# Patient Record
Sex: Female | Born: 1950 | Race: Black or African American | Hispanic: No | Marital: Single | Smoking: Current every day smoker
Health system: Southern US, Community
[De-identification: ages and names within clinical notes are randomized; demographics above are authoritative.]

## PROBLEM LIST (undated history)

## (undated) DIAGNOSIS — Z7289 Other problems related to lifestyle: Secondary | ICD-10-CM

## (undated) DIAGNOSIS — F1090 Alcohol use, unspecified, uncomplicated: Secondary | ICD-10-CM

## (undated) DIAGNOSIS — J449 Chronic obstructive pulmonary disease, unspecified: Secondary | ICD-10-CM

## (undated) DIAGNOSIS — Z720 Tobacco use: Secondary | ICD-10-CM

## (undated) DIAGNOSIS — R7303 Prediabetes: Secondary | ICD-10-CM

## (undated) HISTORY — PX: TOTAL ABDOMINAL HYSTERECTOMY: SHX209

---

## 2019-07-23 DIAGNOSIS — Z1329 Encounter for screening for other suspected endocrine disorder: Secondary | ICD-10-CM | POA: Diagnosis not present

## 2019-07-23 DIAGNOSIS — Z1321 Encounter for screening for nutritional disorder: Secondary | ICD-10-CM | POA: Diagnosis not present

## 2019-07-23 DIAGNOSIS — R0602 Shortness of breath: Secondary | ICD-10-CM | POA: Diagnosis not present

## 2019-07-23 DIAGNOSIS — R0789 Other chest pain: Secondary | ICD-10-CM | POA: Diagnosis not present

## 2019-07-23 DIAGNOSIS — Z1389 Encounter for screening for other disorder: Secondary | ICD-10-CM | POA: Diagnosis not present

## 2019-07-23 DIAGNOSIS — Z131 Encounter for screening for diabetes mellitus: Secondary | ICD-10-CM | POA: Diagnosis not present

## 2019-07-23 DIAGNOSIS — Z0001 Encounter for general adult medical examination with abnormal findings: Secondary | ICD-10-CM | POA: Diagnosis not present

## 2019-07-23 DIAGNOSIS — Z136 Encounter for screening for cardiovascular disorders: Secondary | ICD-10-CM | POA: Diagnosis not present

## 2019-07-23 DIAGNOSIS — Z720 Tobacco use: Secondary | ICD-10-CM | POA: Diagnosis not present

## 2019-07-24 DIAGNOSIS — R0602 Shortness of breath: Secondary | ICD-10-CM | POA: Diagnosis not present

## 2019-07-24 DIAGNOSIS — I5021 Acute systolic (congestive) heart failure: Secondary | ICD-10-CM | POA: Diagnosis not present

## 2019-08-11 DIAGNOSIS — I5022 Chronic systolic (congestive) heart failure: Secondary | ICD-10-CM | POA: Diagnosis not present

## 2019-08-11 DIAGNOSIS — Z131 Encounter for screening for diabetes mellitus: Secondary | ICD-10-CM | POA: Diagnosis not present

## 2019-08-11 DIAGNOSIS — Z0001 Encounter for general adult medical examination with abnormal findings: Secondary | ICD-10-CM | POA: Diagnosis not present

## 2019-08-11 DIAGNOSIS — E559 Vitamin D deficiency, unspecified: Secondary | ICD-10-CM | POA: Diagnosis not present

## 2019-08-11 DIAGNOSIS — E7800 Pure hypercholesterolemia, unspecified: Secondary | ICD-10-CM | POA: Diagnosis not present

## 2019-08-11 DIAGNOSIS — R739 Hyperglycemia, unspecified: Secondary | ICD-10-CM | POA: Diagnosis not present

## 2019-08-12 ENCOUNTER — Other Ambulatory Visit: Payer: Self-pay | Admitting: Internal Medicine

## 2019-08-12 ENCOUNTER — Other Ambulatory Visit: Payer: Self-pay | Admitting: Physician Assistant

## 2019-08-12 DIAGNOSIS — R5381 Other malaise: Secondary | ICD-10-CM

## 2019-08-12 DIAGNOSIS — Z1231 Encounter for screening mammogram for malignant neoplasm of breast: Secondary | ICD-10-CM

## 2019-08-31 ENCOUNTER — Ambulatory Visit: Payer: Medicare HMO

## 2019-09-02 ENCOUNTER — Ambulatory Visit: Payer: Self-pay | Admitting: Cardiology

## 2019-09-25 ENCOUNTER — Ambulatory Visit: Payer: Medicare HMO

## 2019-09-25 ENCOUNTER — Other Ambulatory Visit: Payer: Self-pay | Admitting: Internal Medicine

## 2019-09-25 DIAGNOSIS — Z13820 Encounter for screening for osteoporosis: Secondary | ICD-10-CM

## 2019-11-10 DIAGNOSIS — R0789 Other chest pain: Secondary | ICD-10-CM | POA: Diagnosis not present

## 2019-11-10 DIAGNOSIS — J410 Simple chronic bronchitis: Secondary | ICD-10-CM | POA: Diagnosis not present

## 2019-11-10 DIAGNOSIS — R739 Hyperglycemia, unspecified: Secondary | ICD-10-CM | POA: Diagnosis not present

## 2019-11-10 DIAGNOSIS — E119 Type 2 diabetes mellitus without complications: Secondary | ICD-10-CM | POA: Diagnosis not present

## 2019-11-10 DIAGNOSIS — E7800 Pure hypercholesterolemia, unspecified: Secondary | ICD-10-CM | POA: Diagnosis not present

## 2019-11-10 DIAGNOSIS — E559 Vitamin D deficiency, unspecified: Secondary | ICD-10-CM | POA: Diagnosis not present

## 2019-11-10 DIAGNOSIS — I5022 Chronic systolic (congestive) heart failure: Secondary | ICD-10-CM | POA: Diagnosis not present

## 2019-11-10 DIAGNOSIS — Z23 Encounter for immunization: Secondary | ICD-10-CM | POA: Diagnosis not present

## 2019-12-06 ENCOUNTER — Inpatient Hospital Stay (HOSPITAL_COMMUNITY): Payer: Medicare HMO

## 2019-12-06 ENCOUNTER — Encounter (HOSPITAL_COMMUNITY): Payer: Self-pay | Admitting: Internal Medicine

## 2019-12-06 ENCOUNTER — Inpatient Hospital Stay (HOSPITAL_COMMUNITY)
Admission: EM | Admit: 2019-12-06 | Discharge: 2019-12-10 | DRG: 286 | Disposition: A | Discharge status: Home / Self Care | Payer: Medicare HMO | Attending: Internal Medicine | Admitting: Internal Medicine

## 2019-12-06 ENCOUNTER — Emergency Department (HOSPITAL_COMMUNITY): Payer: Medicare HMO

## 2019-12-06 DIAGNOSIS — Z20822 Contact with and (suspected) exposure to covid-19: Secondary | ICD-10-CM | POA: Diagnosis present

## 2019-12-06 DIAGNOSIS — F17210 Nicotine dependence, cigarettes, uncomplicated: Secondary | ICD-10-CM | POA: Diagnosis present

## 2019-12-06 DIAGNOSIS — J9601 Acute respiratory failure with hypoxia: Secondary | ICD-10-CM

## 2019-12-06 DIAGNOSIS — I517 Cardiomegaly: Secondary | ICD-10-CM | POA: Diagnosis not present

## 2019-12-06 DIAGNOSIS — R0602 Shortness of breath: Secondary | ICD-10-CM | POA: Diagnosis not present

## 2019-12-06 DIAGNOSIS — I472 Ventricular tachycardia: Secondary | ICD-10-CM | POA: Diagnosis not present

## 2019-12-06 DIAGNOSIS — I491 Atrial premature depolarization: Secondary | ICD-10-CM | POA: Diagnosis not present

## 2019-12-06 DIAGNOSIS — R7303 Prediabetes: Secondary | ICD-10-CM | POA: Diagnosis not present

## 2019-12-06 DIAGNOSIS — R0600 Dyspnea, unspecified: Secondary | ICD-10-CM | POA: Diagnosis not present

## 2019-12-06 DIAGNOSIS — R778 Other specified abnormalities of plasma proteins: Secondary | ICD-10-CM | POA: Diagnosis not present

## 2019-12-06 DIAGNOSIS — I272 Pulmonary hypertension, unspecified: Secondary | ICD-10-CM | POA: Diagnosis present

## 2019-12-06 DIAGNOSIS — I5023 Acute on chronic systolic (congestive) heart failure: Secondary | ICD-10-CM | POA: Insufficient documentation

## 2019-12-06 DIAGNOSIS — F1090 Alcohol use, unspecified, uncomplicated: Secondary | ICD-10-CM

## 2019-12-06 DIAGNOSIS — Z599 Problem related to housing and economic circumstances, unspecified: Secondary | ICD-10-CM

## 2019-12-06 DIAGNOSIS — I509 Heart failure, unspecified: Secondary | ICD-10-CM

## 2019-12-06 DIAGNOSIS — J449 Chronic obstructive pulmonary disease, unspecified: Secondary | ICD-10-CM | POA: Diagnosis present

## 2019-12-06 DIAGNOSIS — I5043 Acute on chronic combined systolic (congestive) and diastolic (congestive) heart failure: Secondary | ICD-10-CM | POA: Diagnosis not present

## 2019-12-06 DIAGNOSIS — I493 Ventricular premature depolarization: Secondary | ICD-10-CM | POA: Diagnosis not present

## 2019-12-06 DIAGNOSIS — R9431 Abnormal electrocardiogram [ECG] [EKG]: Secondary | ICD-10-CM | POA: Diagnosis present

## 2019-12-06 DIAGNOSIS — I361 Nonrheumatic tricuspid (valve) insufficiency: Secondary | ICD-10-CM | POA: Diagnosis not present

## 2019-12-06 DIAGNOSIS — R0601 Orthopnea: Secondary | ICD-10-CM | POA: Diagnosis not present

## 2019-12-06 DIAGNOSIS — R079 Chest pain, unspecified: Secondary | ICD-10-CM

## 2019-12-06 DIAGNOSIS — I5020 Unspecified systolic (congestive) heart failure: Secondary | ICD-10-CM | POA: Diagnosis not present

## 2019-12-06 DIAGNOSIS — R0902 Hypoxemia: Secondary | ICD-10-CM | POA: Diagnosis not present

## 2019-12-06 DIAGNOSIS — Z720 Tobacco use: Secondary | ICD-10-CM | POA: Diagnosis present

## 2019-12-06 DIAGNOSIS — Z66 Do not resuscitate: Secondary | ICD-10-CM | POA: Diagnosis present

## 2019-12-06 DIAGNOSIS — I110 Hypertensive heart disease with heart failure: Principal | ICD-10-CM | POA: Diagnosis present

## 2019-12-06 DIAGNOSIS — Z91040 Latex allergy status: Secondary | ICD-10-CM | POA: Diagnosis not present

## 2019-12-06 DIAGNOSIS — I420 Dilated cardiomyopathy: Secondary | ICD-10-CM | POA: Diagnosis present

## 2019-12-06 DIAGNOSIS — J811 Chronic pulmonary edema: Secondary | ICD-10-CM | POA: Diagnosis not present

## 2019-12-06 DIAGNOSIS — Z7289 Other problems related to lifestyle: Secondary | ICD-10-CM

## 2019-12-06 DIAGNOSIS — E785 Hyperlipidemia, unspecified: Secondary | ICD-10-CM | POA: Diagnosis present

## 2019-12-06 DIAGNOSIS — Z6825 Body mass index (BMI) 25.0-25.9, adult: Secondary | ICD-10-CM | POA: Diagnosis not present

## 2019-12-06 DIAGNOSIS — R634 Abnormal weight loss: Secondary | ICD-10-CM | POA: Diagnosis present

## 2019-12-06 DIAGNOSIS — Z823 Family history of stroke: Secondary | ICD-10-CM

## 2019-12-06 DIAGNOSIS — I081 Rheumatic disorders of both mitral and tricuspid valves: Secondary | ICD-10-CM | POA: Diagnosis not present

## 2019-12-06 DIAGNOSIS — I428 Other cardiomyopathies: Secondary | ICD-10-CM | POA: Diagnosis present

## 2019-12-06 DIAGNOSIS — Z789 Other specified health status: Secondary | ICD-10-CM

## 2019-12-06 DIAGNOSIS — I209 Angina pectoris, unspecified: Secondary | ICD-10-CM | POA: Diagnosis not present

## 2019-12-06 DIAGNOSIS — I340 Nonrheumatic mitral (valve) insufficiency: Secondary | ICD-10-CM | POA: Diagnosis not present

## 2019-12-06 HISTORY — DX: Other problems related to lifestyle: Z72.89

## 2019-12-06 HISTORY — DX: Tobacco use: Z72.0

## 2019-12-06 HISTORY — DX: Alcohol use, unspecified, uncomplicated: F10.90

## 2019-12-06 HISTORY — DX: Chronic obstructive pulmonary disease, unspecified: J44.9

## 2019-12-06 HISTORY — DX: Prediabetes: R73.03

## 2019-12-06 LAB — URINALYSIS, ROUTINE W REFLEX MICROSCOPIC
Bacteria, UA: NONE SEEN
Bilirubin Urine: NEGATIVE
Glucose, UA: NEGATIVE mg/dL
Hgb urine dipstick: NEGATIVE
Ketones, ur: NEGATIVE mg/dL
Nitrite: NEGATIVE
Protein, ur: 30 mg/dL — AB
Specific Gravity, Urine: 1.006 (ref 1.005–1.030)
pH: 5 (ref 5.0–8.0)

## 2019-12-06 LAB — COMPREHENSIVE METABOLIC PANEL
ALT: 16 U/L (ref 0–44)
AST: 23 U/L (ref 15–41)
Albumin: 3.2 g/dL — ABNORMAL LOW (ref 3.5–5.0)
Alkaline Phosphatase: 40 U/L (ref 38–126)
Anion gap: 13 (ref 5–15)
BUN: 15 mg/dL (ref 8–23)
CO2: 18 mmol/L — ABNORMAL LOW (ref 22–32)
Calcium: 8.9 mg/dL (ref 8.9–10.3)
Chloride: 106 mmol/L (ref 98–111)
Creatinine, Ser: 0.81 mg/dL (ref 0.44–1.00)
GFR, Estimated: >60 mL/min (ref >60)
Glucose, Bld: 149 mg/dL — ABNORMAL HIGH (ref 70–99)
Potassium: 3.7 mmol/L (ref 3.5–5.1)
Sodium: 137 mmol/L (ref 135–145)
Total Bilirubin: 1.2 mg/dL (ref 0.3–1.2)
Total Protein: 6.5 g/dL (ref 6.5–8.1)

## 2019-12-06 LAB — CBC WITH DIFFERENTIAL/PLATELET
Abs Immature Granulocytes: 0.03 10*3/uL (ref 0.00–0.07)
Basophils Absolute: 0.1 10*3/uL (ref 0.0–0.1)
Basophils Relative: 1 %
Eosinophils Absolute: 0.3 10*3/uL (ref 0.0–0.5)
Eosinophils Relative: 4 %
HCT: 40.5 % (ref 36.0–46.0)
Hemoglobin: 12.5 g/dL (ref 12.0–15.0)
Immature Granulocytes: 0 %
Lymphocytes Relative: 34 %
Lymphs Abs: 2.4 10*3/uL (ref 0.7–4.0)
MCH: 27.6 pg (ref 26.0–34.0)
MCHC: 30.9 g/dL (ref 30.0–36.0)
MCV: 89.4 fL (ref 80.0–100.0)
Monocytes Absolute: 0.7 10*3/uL (ref 0.1–1.0)
Monocytes Relative: 10 %
Neutro Abs: 3.6 10*3/uL (ref 1.7–7.7)
Neutrophils Relative %: 51 %
Platelets: 186 10*3/uL (ref 150–400)
RBC: 4.53 MIL/uL (ref 3.87–5.11)
RDW: 15 % (ref 11.5–15.5)
WBC: 7.1 10*3/uL (ref 4.0–10.5)
nRBC: 0 % (ref 0.0–0.2)

## 2019-12-06 LAB — TROPONIN I (HIGH SENSITIVITY)
Troponin I (High Sensitivity): 28 ng/L — ABNORMAL HIGH (ref <18)
Troponin I (High Sensitivity): 32 ng/L — ABNORMAL HIGH (ref <18)

## 2019-12-06 LAB — LIPASE, BLOOD: Lipase: 30 U/L (ref 11–51)

## 2019-12-06 LAB — ECHOCARDIOGRAM COMPLETE
Area-P 1/2: 6.32 cm2
MV M vel: 5.43 m/s
MV Peak grad: 117.9 mmHg
Radius: 0.4 cm
S' Lateral: 4.4 cm
Single Plane A4C EF: 28.9 %

## 2019-12-06 LAB — BRAIN NATRIURETIC PEPTIDE: B Natriuretic Peptide: 1862.8 pg/mL — ABNORMAL HIGH (ref 0.0–100.0)

## 2019-12-06 LAB — RESPIRATORY PANEL BY RT PCR (FLU A&B, COVID)
Influenza A by PCR: NEGATIVE
Influenza B by PCR: NEGATIVE
SARS Coronavirus 2 by RT PCR: NEGATIVE

## 2019-12-06 MED ORDER — LORAZEPAM 1 MG PO TABS: 1.0000 mg | ORAL_TABLET | ORAL | Status: DC | PRN

## 2019-12-06 MED ORDER — IPRATROPIUM-ALBUTEROL 0.5-2.5 (3) MG/3ML IN SOLN
3.0000 mL | Freq: Once | RESPIRATORY_TRACT | Status: AC
  Administered 2019-12-06: 3 mL via RESPIRATORY_TRACT
  Filled 2019-12-06: qty 3

## 2019-12-06 MED ORDER — SODIUM CHLORIDE 0.9% FLUSH: 3.0000 mL | INTRAVENOUS | Status: DC | PRN

## 2019-12-06 MED ORDER — LORAZEPAM 2 MG/ML IJ SOLN: 1.0000 mg | INTRAMUSCULAR | Status: DC | PRN

## 2019-12-06 MED ORDER — METHYLPREDNISOLONE SODIUM SUCC 125 MG IJ SOLR
125.0000 mg | Freq: Once | INTRAMUSCULAR | Status: AC
  Administered 2019-12-06: 125 mg via INTRAVENOUS
  Filled 2019-12-06: qty 2

## 2019-12-06 MED ORDER — ASPIRIN EC 81 MG PO TBEC
81.0000 mg | DELAYED_RELEASE_TABLET | Freq: Every day | ORAL | Status: DC
  Administered 2019-12-06 – 2019-12-10 (×4): 81 mg via ORAL
  Filled 2019-12-06 (×5): qty 1

## 2019-12-06 MED ORDER — THIAMINE HCL 100 MG/ML IJ SOLN
100.0000 mg | Freq: Every day | INTRAMUSCULAR | Status: DC
  Filled 2019-12-06: qty 2

## 2019-12-06 MED ORDER — FUROSEMIDE 10 MG/ML IJ SOLN
40.0000 mg | Freq: Two times a day (BID) | INTRAMUSCULAR | Status: DC
  Administered 2019-12-07: 40 mg via INTRAVENOUS
  Filled 2019-12-06: qty 4

## 2019-12-06 MED ORDER — THIAMINE HCL 100 MG PO TABS
100.0000 mg | ORAL_TABLET | Freq: Every day | ORAL | Status: DC
  Administered 2019-12-06 – 2019-12-10 (×4): 100 mg via ORAL
  Filled 2019-12-06 (×5): qty 1

## 2019-12-06 MED ORDER — SODIUM CHLORIDE 0.9% FLUSH
3.0000 mL | Freq: Two times a day (BID) | INTRAVENOUS | Status: DC
  Administered 2019-12-06 – 2019-12-09 (×4): 3 mL via INTRAVENOUS

## 2019-12-06 MED ORDER — ONDANSETRON HCL 4 MG/2ML IJ SOLN: 4.0000 mg | Freq: Four times a day (QID) | INTRAMUSCULAR | Status: DC | PRN

## 2019-12-06 MED ORDER — FOLIC ACID 1 MG PO TABS
1.0000 mg | ORAL_TABLET | Freq: Every day | ORAL | Status: DC
  Administered 2019-12-06 – 2019-12-10 (×4): 1 mg via ORAL
  Filled 2019-12-06 (×5): qty 1

## 2019-12-06 MED ORDER — ADULT MULTIVITAMIN W/MINERALS CH
1.0000 | ORAL_TABLET | Freq: Every day | ORAL | Status: DC
  Administered 2019-12-06 – 2019-12-10 (×4): 1 via ORAL
  Filled 2019-12-06 (×5): qty 1

## 2019-12-06 MED ORDER — SODIUM CHLORIDE 0.9 % IV SOLN: 250.0000 mL | INTRAVENOUS | Status: DC | PRN

## 2019-12-06 MED ORDER — ACETAMINOPHEN 325 MG PO TABS: 650.0000 mg | ORAL_TABLET | ORAL | Status: DC | PRN

## 2019-12-06 MED ORDER — FUROSEMIDE 10 MG/ML IJ SOLN
80.0000 mg | Freq: Once | INTRAMUSCULAR | Status: AC
  Administered 2019-12-06: 80 mg via INTRAVENOUS
  Filled 2019-12-06: qty 8

## 2019-12-06 MED ORDER — ENOXAPARIN SODIUM 40 MG/0.4ML ~~LOC~~ SOLN
40.0000 mg | SUBCUTANEOUS | Status: DC
  Administered 2019-12-09: 40 mg via SUBCUTANEOUS
  Filled 2019-12-06: qty 0.4

## 2019-12-06 NOTE — ED Notes (Signed)
Pt resting in bed. Breathing improved with tx

## 2019-12-06 NOTE — ED Notes (Signed)
Report to Heather, RN.

## 2019-12-06 NOTE — Progress Notes (Signed)
  Echocardiogram 2D Echocardiogram has been performed.  Delcie Roch 12/06/2019, 4:55 PM

## 2019-12-06 NOTE — ED Notes (Signed)
Dinner Tray Ordered 1650 -ms 

## 2019-12-06 NOTE — ED Provider Notes (Signed)
[facility] EMERGENCY DEPARTMENT Provider Note   CSN: [id number] Arrival date & time: 12/06/19  1248     History Chief Complaint  Patient presents with  . Shortness of Breath    Emma Hahn is a 69 y.o. female with a past medical history of CHF, COPD not on supplemental oxygen at baseline presenting to the ED with a chief complaint of shortness of breath, cough and chest pain.  Since yesterday has been having dyspnea on exertion, intermittent chest pain and productive cough.  Reports coughing up white mucus.  She denies any sick contacts with similar symptoms.  She uses an inhaler at home without much improvement with this episode of shortness of breath.  She also took nitroglycerin approximately 4 hours ago without much improvement in her chest pain.  She does not take any other home medications.  She also reports suprapubic and epigastric abdominal pain for the past few days.  Pain is worse with certain movements.  No diarrhea, vomiting, urinary symptoms.  She was told last year that "something in my heart was not pumping correctly."  She was referred to a cardiologist and pulmonologist but did not follow-up with either of them.  She moved to [place] from [place] last year.  Denies history of DVT or PE, recent immobilization or anticoagulant use. She has been partially vaccinated against Covid.  HPI     No past medical history on file.  There are no problems to display for this patient.     OB History   No obstetric history on file.     No family history on file.  Social History   Tobacco Use  . Smoking status: Not on file  Substance Use Topics  . Alcohol use: Not on file  . Drug use: Not on file    Home Medications Prior to Admission medications   Not on File    Allergies    Latex  Review of Systems   Review of Systems  Constitutional: Negative for appetite change, chills and fever.  HENT: Negative for ear pain, rhinorrhea, sneezing and  sore throat.   Eyes: Negative for photophobia and visual disturbance.  Respiratory: Positive for cough, chest tightness and shortness of breath. Negative for wheezing.   Cardiovascular: Positive for chest pain. Negative for palpitations.  Gastrointestinal: Positive for abdominal pain. Negative for blood in stool, constipation, diarrhea, nausea and vomiting.  Genitourinary: Negative for dysuria, hematuria and urgency.  Musculoskeletal: Negative for myalgias.  Skin: Negative for rash.  Neurological: Negative for dizziness, weakness and light-headedness.    Physical Exam Updated Vital Signs BP (!) 148/113   Pulse 95   Temp 97.9 F (36.6 C) (Oral)   Resp (!) 24   SpO2 96%   Physical Exam Vitals and nursing note reviewed.  Constitutional:      General: She is not in acute distress.    Appearance: She is well-developed.     Comments: Patient speaking short sentences.  On 4 L of oxygen via nasal cannula.  HENT:     Head: Normocephalic and atraumatic.     Nose: Nose normal.  Eyes:     General: No scleral icterus.       Left eye: No discharge.     Conjunctiva/sclera: Conjunctivae normal.  Cardiovascular:     Rate and Rhythm: Regular rhythm. Tachycardia present.     Heart sounds: Normal heart sounds. No murmur heard.  No friction rub. No gallop.   Pulmonary:     Effort:  Pulmonary effort is normal. No respiratory distress.     Breath sounds: Examination of the right-middle field reveals decreased breath sounds. Examination of the left-middle field reveals decreased breath sounds. Examination of the right-lower field reveals decreased breath sounds. Examination of the left-lower field reveals decreased breath sounds. Decreased breath sounds present.  Abdominal:     General: Bowel sounds are normal. There is no distension.     Palpations: Abdomen is soft.     Tenderness: There is abdominal tenderness (Epigastric). There is no guarding.  Musculoskeletal:        General: Normal range of  motion.     Cervical back: Normal range of motion and neck supple.     Right lower leg: No tenderness. No edema.     Left lower leg: No tenderness. No edema.     Comments: No lower extremity edema, erythema or calf tenderness bilaterally.  Skin:    General: Skin is warm and dry.     Findings: No rash.  Neurological:     Mental Status: She is alert.     Motor: No abnormal muscle tone.     Coordination: Coordination normal.     ED Results / Procedures / Treatments   Labs (all labs ordered are listed, but only abnormal results are displayed) Labs Reviewed  COMPREHENSIVE METABOLIC PANEL - Abnormal; Notable for the following components:      Result Value   CO2 18 (*)    Glucose, Bld 149 (*)    Albumin 3.2 (*)    All other components within normal limits  BRAIN NATRIURETIC PEPTIDE - Abnormal; Notable for the following components:   B Natriuretic Peptide 1,862.8 (*)    All other components within normal limits  TROPONIN I (HIGH SENSITIVITY) - Abnormal; Notable for the following components:   Troponin I (High Sensitivity) 28 (*)    All other components within normal limits  RESPIRATORY PANEL BY RT PCR (FLU A&B, COVID)  CBC WITH DIFFERENTIAL/PLATELET  LIPASE, BLOOD  URINALYSIS, ROUTINE W REFLEX MICROSCOPIC  TROPONIN I (HIGH SENSITIVITY)    EKG EKG Interpretation  Date/Time:  Sunday December 06 2019 13:02:18 EST Ventricular Rate:  95 PR Interval:    QRS Duration: 72 QT Interval:  405 QTC Calculation: 510 R Axis:   59 Text Interpretation: Sinus rhythm Atrial premature complex Probable left atrial enlargement Borderline low voltage, extremity leads Probable left ventricular hypertrophy Prolonged QT interval No old tracing to compare Confirmed by Rolan Bucco [phone]) on 12/06/2019 1:27:39 PM   Radiology DG Chest Portable 1 View  Result Date: 12/06/2019 CLINICAL DATA:  Shortness of breath, chest pain EXAM: PORTABLE CHEST 1 VIEW COMPARISON:  None. FINDINGS: Cardiomegaly.  Atherosclerotic calcification of the aortic knob. Pulmonary vascular congestion. Increased interstitial markings throughout both lungs with patchy bibasilar opacities. No appreciable pleural fluid collection. No pneumothorax. IMPRESSION: Cardiomegaly with pulmonary vascular congestion and interstitial edema. Patchy bibasilar opacities may reflect alveolar edema versus infection. Electronically Signed   By: Duanne Guess D.O.   On: 12/06/2019 14:03    Procedures Procedures (including critical care time)  Medications Ordered in ED Medications  ipratropium-albuterol (DUONEB) 0.5-2.5 (3) MG/3ML nebulizer solution 3 mL (3 mLs Nebulization Given 12/06/19 1413)  methylPREDNISolone sodium succinate (SOLU-MEDROL) 125 mg/2 mL injection 125 mg (125 mg Intravenous Given 12/06/19 1413)  furosemide (LASIX) injection 80 mg (80 mg Intravenous Given 12/06/19 1515)    ED Course  I have reviewed the triage vital signs and the nursing notes.  Pertinent labs & imaging  results that were available during my care of the patient were reviewed by me and considered in my medical decision making (see chart for details).  Clinical Course as of Dec 05 1541  Wynelle Link Dec 06, 2019  1502 B Natriuretic Peptide(!): 1,862.8 [HK]  1502 Suspect this elevation in the setting of CHF exacerbation.  Troponin I (High Sensitivity)(!): 28 [HK]  1503 Shows edema which is most likely the cause of her symptoms.  DG Chest Portable 1 View [HK]    Clinical Course User Index [HK] Dietrich Pates, PA-C   MDM Rules/Calculators/A&P                          69 year old female with a past medical history of CHF, COPD not on supplemental oxygen at baseline presenting to the ED with a chief complaint of shortness of breath, cough and chest pain.  Reports dyspnea on exertion with intermittent chest pain and productive cough since yesterday.  She has an inhaler at home that she uses as well as nitroglycerin.  Minimal improvement noted with both of  these things.  She was told last year that she has ?CHF but did not follow-up with cardiology or pulmonology at the time.  No history of DVT PE, recent immobilization or anticoagulant use.  On exam patient speaking short sentences.  She has diminished breath sounds globally.  She is on 4 L of oxygen although when I decreased it to 2 L her oxygen remains at 95 to 97%.  No lower extremity edema, erythema or calf tenderness.  EKG shows sinus rhythm, prolonged QT without STEMI.  Chest x-ray concerning for edema.  Lab work significant for elevated troponin at 28 which could be set to her fluid overload.  BNP significantly elevated to 1800.  I am unable to see any of her prior work-up or diagnoses as she recently moved to the area.  CBC unremarkable.  BMP with normal creatinine and potassium level.  She has some improvement with Solu-Medrol and DuoNeb given.  I have ordered Lasix and she will require admission for ongoing work-up and management of what appears to be a CHF exacerbation.  Patient is agreeable to admission. COVID testing pending.  All imaging, if done today, including plain films, CT scans, and ultrasounds, independently reviewed by me, and interpretations confirmed via formal radiology reads.  Portions of this note were generated with Scientist, clinical (histocompatibility and immunogenetics). Dictation errors may occur despite best attempts at proofreading.  Final Clinical Impression(s) / ED Diagnoses Final diagnoses:  Acute on chronic congestive heart failure, unspecified heart failure type [facility])    Rx / DC Orders ED Discharge Orders    None       Dietrich Pates, PA-C 12/06/19 1543    Rolan Bucco, MD 12/06/19 2040

## 2019-12-06 NOTE — H&P (Addendum)
History and Physical    Emma Hahn GMW:[id number] DOB: 10/21/1950 DOA: 12/06/2019  Referring MD/NP/PA: Dietrich Pates, PA-C PCP: Jackie Plum, MD  Patient coming from: home via EMS  Chief Complaint: Shortness of breath and chest pain  I have personally briefly reviewed patient's old medical records in [facility]   HPI: Emma Hahn is a 69 y.o. female with medical history significant of borderline diabetes, COPD, and tobacco abuse who presents with complaints of shortness of breath and chest pain. She reports that symptoms have actually been present since June of this year and just progressively getting worse. Even walking across a room in her home she has to rest and catch her breath. She complains of having intermittent chest pain that feels like someone sitting on her chest. Tonight she is unable to lay flat or on her side without feeling as though she is suffocating. She had followed up with her primary care doctor in June for the symptoms. She states that they given her 3 medications 1 of which was nitroglycerin and the other 2 she does not recall. She is also sent referrals to cardiology and told that her heart may not be functioning, but she never followed up. Patient had been taking nitroglycerin intermittently with some relief in chest pain symptoms. However, patient notes that recently she had taken 4 nitroglycerin tablets due to her chest pain symptoms. Other associated symptoms included intermittent productive cough, wheezing and generalized malaise. Denies that she has had any leg swelling.  Patient has been using her friend's inhaler intermittently to help with wheezing symptoms because the inhaler that was prescribed by her PCP was too expensive. She has received 1 dose of the virus vaccine, but never went back for her second dose. Ultimately the patient states that she has had a good life and would not plan to be resuscitated if she were to pass away. Patient is not on oxygen  at baseline. She has been in the process of trying to quit smoking and has cut back to 4 cigarettes/day. In route with EMS patient had been placed on 4 L of nasal cannula oxygen.  ED Course: On admission into the emergency department patient was seen to be afebrile with pulse 93-107, respiration 14-30, blood pressure is elevated at 168/88, and patient placed on 2 L of nasal cannula never noted to desaturate.  Labs significant for BNP 1862.8 and troponin 28->32.  Influenza and COVID-19 screening were both negative.  Chest x-ray significant for cardiomegaly with interstitial edema.  Review of Systems  Constitutional: Positive for malaise/fatigue. Negative for chills and fever.  HENT: Negative for ear discharge and nosebleeds.   Eyes: Negative for photophobia and pain.  Respiratory: Positive for cough, sputum production, shortness of breath and wheezing.   Cardiovascular: Positive for chest pain and orthopnea. Negative for leg swelling.  Gastrointestinal: Negative for abdominal pain, diarrhea, nausea and vomiting.  Genitourinary: Negative for dysuria and hematuria.  Musculoskeletal: Negative for falls and myalgias.  Skin: Negative for itching and rash.  Neurological: Negative for focal weakness and loss of consciousness.  Endo/Heme/Allergies: Negative for polydipsia.  Psychiatric/Behavioral: Positive for substance abuse. The patient has insomnia.     Past Medical History:  Diagnosis Date  . Borderline diabetes   . COPD (chronic obstructive pulmonary disease) (HCC)   . Tobacco abuse     Past Surgical History:  Procedure Laterality Date  . TOTAL ABDOMINAL HYSTERECTOMY       reports that she has been smoking cigarettes. She has  never used smokeless tobacco. She reports current alcohol use. She reports that she does not use drugs.  Allergies  Allergen Reactions  . Latex     History reviewed. No pertinent family history.  Prior to Admission medications   Not on File    Physical  Exam:  Constitutional: Elderly female who appears to be in some respiratory distress Vitals:   12/06/19 1415 12/06/19 1430 12/06/19 1445 12/06/19 1500  BP: (!) 168/88 (!) 142/101 (!) 149/106 (!) 148/113  Pulse: 99 96 93 95  Resp: (!) 30 14 19  (!) 24  Temp:      TempSrc:      SpO2: 95% 97% 96% 96%   Eyes: PERRL, lids and conjunctivae normal ENMT: Mucous membranes are moist. Posterior pharynx clear of any exudate or lesions. .  Neck: normal, supple, no masses, no thyromegaly Respiratory: Mildly tachypneic without significant wheezes appreciated at this time. Patient with O2 saturations maintained. Patient speaking in shortened sentences. Cardiovascular: Regular rate and rhythm, no murmurs / rubs / gallops. No extremity edema. 2+ pedal pulses. No carotid bruits.  Abdomen: no tenderness, no masses palpated. No hepatosplenomegaly. Bowel sounds positive.  Musculoskeletal: no clubbing / cyanosis. No joint deformity upper and lower extremities. Good ROM, no contractures. Normal muscle tone.  Skin: no rashes, lesions, ulcers. No induration Neurologic: CN 2-12 grossly intact. Sensation intact, DTR normal. Strength 5/5 in all 4.  Psychiatric: Normal judgment and insight. Alert and oriented x 3. Normal mood.     Labs on Admission: I have personally reviewed following labs and imaging studies  CBC: Recent Labs  Lab 12/06/19 1404  WBC 7.1  NEUTROABS 3.6  HGB 12.5  HCT 40.5  MCV 89.4  PLT 186   Basic Metabolic Panel: Recent Labs  Lab 12/06/19 1404  NA 137  K 3.7  CL 106  CO2 18*  GLUCOSE 149*  BUN 15  CREATININE 0.81  CALCIUM 8.9   GFR: CrCl cannot be calculated (Unknown ideal weight.). Liver Function Tests: Recent Labs  Lab 12/06/19 1404  AST 23  ALT 16  ALKPHOS 40  BILITOT 1.2  PROT 6.5  ALBUMIN 3.2*   Recent Labs  Lab 12/06/19 1404  LIPASE 30   No results for input(s): AMMONIA in the last 168 hours. Coagulation Profile: No results for input(s): INR, PROTIME  in the last 168 hours. Cardiac Enzymes: No results for input(s): CKTOTAL, CKMB, CKMBINDEX, TROPONINI in the last 168 hours. BNP (last 3 results) No results for input(s): PROBNP in the last 8760 hours. HbA1C: No results for input(s): HGBA1C in the last 72 hours. CBG: No results for input(s): GLUCAP in the last 168 hours. Lipid Profile: No results for input(s): CHOL, HDL, LDLCALC, TRIG, CHOLHDL, LDLDIRECT in the last 72 hours. Thyroid Function Tests: No results for input(s): TSH, T4TOTAL, FREET4, T3FREE, THYROIDAB in the last 72 hours. Anemia Panel: No results for input(s): VITAMINB12, FOLATE, FERRITIN, TIBC, IRON, RETICCTPCT in the last 72 hours. Urine analysis: No results found for: COLORURINE, APPEARANCEUR, LABSPEC, PHURINE, GLUCOSEU, HGBUR, BILIRUBINUR, KETONESUR, PROTEINUR, UROBILINOGEN, NITRITE, LEUKOCYTESUR Sepsis Labs: Recent Results (from the past 240 hour(s))  Respiratory Panel by RT PCR (Flu A&B, Covid) - Nasopharyngeal Swab     Status: None   Collection Time: 12/06/19  2:00 PM   Specimen: Nasopharyngeal Swab; Nasopharyngeal(NP) swabs in vial transport medium  Result Value Ref Range Status   SARS Coronavirus 2 by RT PCR NEGATIVE NEGATIVE Final    Comment: (NOTE) SARS-CoV-2 target nucleic acids are NOT DETECTED.  The SARS-CoV-2 RNA is generally detectable in upper respiratoy specimens during the acute phase of infection. The lowest concentration of SARS-CoV-2 viral copies this assay can detect is 131 copies/mL. A negative result does not preclude SARS-Cov-2 infection and should not be used as the sole basis for treatment or other patient management decisions. A negative result may occur with  improper specimen collection/handling, submission of specimen other than nasopharyngeal swab, presence of viral mutation(s) within the areas targeted by this assay, and inadequate number of viral copies (<131 copies/mL). A negative result must be combined with clinical observations,  patient history, and epidemiological information. The expected result is Negative.  Fact Sheet for Patients:  https://www.moore.com/  Fact Sheet for Healthcare Providers:  https://www.young.biz/  This test is no t yet approved or cleared by the [place] FDA and  has been authorized for detection and/or diagnosis of SARS-CoV-2 by FDA under an Emergency Use Authorization (EUA). This EUA will remain  in effect (meaning this test can be used) for the duration of the COVID-19 declaration under Section 564(b)(1) of the Act, 21 U.S.C. section 360bbb-3(b)(1), unless the authorization is terminated or revoked sooner.     Influenza A by PCR NEGATIVE NEGATIVE Final   Influenza B by PCR NEGATIVE NEGATIVE Final    Comment: (NOTE) The Xpert Xpress SARS-CoV-2/FLU/RSV assay is intended as an aid in  the diagnosis of influenza from Nasopharyngeal swab specimens and  should not be used as a sole basis for treatment. Nasal washings and  aspirates are unacceptable for Xpert Xpress SARS-CoV-2/FLU/RSV  testing.  Fact Sheet for Patients: https://www.moore.com/  Fact Sheet for Healthcare Providers: https://www.young.biz/  This test is not yet approved or cleared by the [place] FDA and  has been authorized for detection and/or diagnosis of SARS-CoV-2 by  FDA under an Emergency Use Authorization (EUA). This EUA will remain  in effect (meaning this test can be used) for the duration of the  Covid-19 declaration under Section 564(b)(1) of the Act, 21  U.S.C. section 360bbb-3(b)(1), unless the authorization is  terminated or revoked. Performed at [facility], [address], [place], [place] [address]      Radiological Exams on Admission: DG Chest Portable 1 View  Result Date: 12/06/2019 CLINICAL DATA:  Shortness of breath, chest pain EXAM: PORTABLE CHEST 1 VIEW COMPARISON:  None. FINDINGS: Cardiomegaly.  Atherosclerotic calcification of the aortic knob. Pulmonary vascular congestion. Increased interstitial markings throughout both lungs with patchy bibasilar opacities. No appreciable pleural fluid collection. No pneumothorax. IMPRESSION: Cardiomegaly with pulmonary vascular congestion and interstitial edema. Patchy bibasilar opacities may reflect alveolar edema versus infection. Electronically Signed   By: Duanne Guess D.O.   On: 12/06/2019 14:03    EKG: Independently reviewed.  Sinus rhythm 95 bpm with premature atrial complexes and signs of LVH.  Assessment/Plan Acute exacerbation of congestive heart failure: Patient presents with complaints of gradually worsening shortness of breath with exertion.  Chest x-ray significant for cardiomegaly with likely signs of interstitial edema. BNP was elevated at 1862.8. Patient had been given 80 mg of Lasix IV.  -Admit to a telemetry bed -Heart failure orders set  initiated  -Continuous pulse oximetry with nasal cannula oxygen as needed to keep O2 saturations >92% -Strict I&Os and daily weights -Add on TSH -Elevate lower extremities -Lasix 40 mg IV Bid starting in a.m. -Reassess in a.m. and adjust diuresis as needed. -Check echocardiogram -Message sent for cardiology to evaluate in a.m.  Elevated troponin, chest pain: Acute. Patient has been  having intermittent chest pains over the last several months and feels like someone sitting on her chest. Using nitroglycerin intermittently. Troponin mildly elevated at 28->32 on admission. At this time is not totally clear symptoms may be related to acute blockage. Patient is unsure if she would want to go any further evaluation. -Check hemoglobin A1c and lipid panel in a.m. -Aspirin -Continue to monitor  Borderline diabetes: Undetermined. Patient reports a history of borderline diabetes not currently on any medication for treatment. Blood glucose is mildly elevated at 149. -Check hemoglobin A1c in  a.m. -Consider placing on sliding scale insulin if needed  Prolonged QT interval: On admission QTc 510. -Hold QT prolonging medications  Tobacco use: Patient reports smoking approximately 4 cigarettes/day on average this time. -Counseled on need of cessation of tobacco use  Alcohol use: Patient reports drinking vodka or gin every other day or so, but denies any prior history of withdrawals. -CIWA protocols initiated and discontinue whenever medically appropriate  DO NOT RESUSCITATE: Present on admission.  DVT prophylaxis: lovenox  Code Status: DO NOT RESUSCITATE Family Communication: Son updated over the phone Disposition Plan: Discharge home once medically stable Consults called: none  Admission status: inpatient status requiring more than 2 midnight stay for IV diuresis  Clydie Braun MD [facility] Pager [phone]   If 7PM-7AM, please contact night-coverage [email or web address] Password [email or web address]  12/06/2019, 3:50 PM

## 2019-12-06 NOTE — ED Triage Notes (Signed)
BIB EMS for SOB that started last night. Placed on 4L. Hx of COPD and CHF. Chronic cough

## 2019-12-07 ENCOUNTER — Encounter (HOSPITAL_COMMUNITY): Payer: Self-pay | Admitting: Internal Medicine

## 2019-12-07 DIAGNOSIS — I209 Angina pectoris, unspecified: Secondary | ICD-10-CM

## 2019-12-07 DIAGNOSIS — R079 Chest pain, unspecified: Secondary | ICD-10-CM | POA: Diagnosis not present

## 2019-12-07 DIAGNOSIS — I5043 Acute on chronic combined systolic (congestive) and diastolic (congestive) heart failure: Secondary | ICD-10-CM | POA: Diagnosis not present

## 2019-12-07 DIAGNOSIS — Z720 Tobacco use: Secondary | ICD-10-CM | POA: Diagnosis not present

## 2019-12-07 DIAGNOSIS — I361 Nonrheumatic tricuspid (valve) insufficiency: Secondary | ICD-10-CM

## 2019-12-07 LAB — BASIC METABOLIC PANEL
Anion gap: 15 (ref 5–15)
BUN: 21 mg/dL (ref 8–23)
CO2: 22 mmol/L (ref 22–32)
Calcium: 9.7 mg/dL (ref 8.9–10.3)
Chloride: 100 mmol/L (ref 98–111)
Creatinine, Ser: 1.07 mg/dL — ABNORMAL HIGH (ref 0.44–1.00)
GFR, Estimated: 56 mL/min — ABNORMAL LOW (ref >60)
Glucose, Bld: 285 mg/dL — ABNORMAL HIGH (ref 70–99)
Potassium: 3.5 mmol/L (ref 3.5–5.1)
Sodium: 137 mmol/L (ref 135–145)

## 2019-12-07 LAB — LIPID PANEL
Cholesterol: 213 mg/dL — ABNORMAL HIGH (ref 0–200)
HDL: 61 mg/dL (ref >40)
LDL Cholesterol: 143 mg/dL — ABNORMAL HIGH (ref 0–99)
Total CHOL/HDL Ratio: 3.5 RATIO
Triglycerides: 45 mg/dL (ref <150)
VLDL: 9 mg/dL (ref 0–40)

## 2019-12-07 LAB — PHOSPHORUS: Phosphorus: 3.3 mg/dL (ref 2.5–4.6)

## 2019-12-07 LAB — HIV ANTIBODY (ROUTINE TESTING W REFLEX): HIV Screen 4th Generation wRfx: NONREACTIVE

## 2019-12-07 LAB — MAGNESIUM: Magnesium: 1.5 mg/dL — ABNORMAL LOW (ref 1.7–2.4)

## 2019-12-07 LAB — HEMOGLOBIN A1C
Hgb A1c MFr Bld: 5.8 % — ABNORMAL HIGH (ref 4.8–5.6)
Mean Plasma Glucose: 119.76 mg/dL

## 2019-12-07 LAB — TSH: TSH: 0.509 u[IU]/mL (ref 0.350–4.500)

## 2019-12-07 LAB — GLUCOSE, CAPILLARY: Glucose-Capillary: 145 mg/dL — ABNORMAL HIGH (ref 70–99)

## 2019-12-07 MED ORDER — SODIUM CHLORIDE 0.9% FLUSH
3.0000 mL | Freq: Two times a day (BID) | INTRAVENOUS | Status: DC
  Administered 2019-12-07 – 2019-12-09 (×2): 3 mL via INTRAVENOUS

## 2019-12-07 MED ORDER — MAGNESIUM SULFATE 4 GM/100ML IV SOLN
4.0000 g | Freq: Once | INTRAVENOUS | Status: AC
  Administered 2019-12-07: 4 g via INTRAVENOUS
  Filled 2019-12-07: qty 100

## 2019-12-07 MED ORDER — ASPIRIN 81 MG PO CHEW
81.0000 mg | CHEWABLE_TABLET | ORAL | Status: AC
  Administered 2019-12-08: 81 mg via ORAL
  Filled 2019-12-07: qty 1

## 2019-12-07 MED ORDER — LORAZEPAM 0.5 MG PO TABS
0.5000 mg | ORAL_TABLET | Freq: Every evening | ORAL | Status: DC | PRN
  Administered 2019-12-08: 0.5 mg via ORAL
  Filled 2019-12-07: qty 1

## 2019-12-07 MED ORDER — ATORVASTATIN CALCIUM 40 MG PO TABS
40.0000 mg | ORAL_TABLET | Freq: Every day | ORAL | Status: DC
  Administered 2019-12-07 – 2019-12-10 (×3): 40 mg via ORAL
  Filled 2019-12-07 (×4): qty 1

## 2019-12-07 MED ORDER — LIVING BETTER WITH HEART FAILURE BOOK: Freq: Once | Status: AC

## 2019-12-07 MED ORDER — SODIUM CHLORIDE 0.9% FLUSH: 3.0000 mL | INTRAVENOUS | Status: DC | PRN

## 2019-12-07 MED ORDER — SODIUM CHLORIDE 0.9 % IV SOLN
250.0000 mL | INTRAVENOUS | Status: DC | PRN
  Administered 2019-12-08: 250 mL via INTRAVENOUS

## 2019-12-07 MED ORDER — POTASSIUM CHLORIDE CRYS ER 20 MEQ PO TBCR: 40.0000 meq | EXTENDED_RELEASE_TABLET | Freq: Once | ORAL | Status: DC

## 2019-12-07 MED ORDER — SODIUM CHLORIDE 0.9 % IV SOLN: INTRAVENOUS | Status: DC

## 2019-12-07 MED ORDER — POTASSIUM CHLORIDE CRYS ER 20 MEQ PO TBCR
40.0000 meq | EXTENDED_RELEASE_TABLET | Freq: Two times a day (BID) | ORAL | Status: AC
  Administered 2019-12-07 (×2): 40 meq via ORAL
  Filled 2019-12-07 (×2): qty 2

## 2019-12-07 MED ORDER — METOPROLOL TARTRATE 12.5 MG HALF TABLET
12.5000 mg | ORAL_TABLET | Freq: Two times a day (BID) | ORAL | Status: DC
  Administered 2019-12-07: 12.5 mg via ORAL
  Filled 2019-12-07: qty 1

## 2019-12-07 NOTE — Consult Note (Addendum)
Cardiology Consultation:   Patient ID: Emma Hahn MRN: [id number]; DOB: Nov 03, 1950  Admit date: 12/06/2019 Date of Consult: 12/07/2019  Primary Care Provider: Jackie Plum, MD [facility] Cardiologist: Emma Constant, MD (new) [facility] Electrophysiologist:  None    Patient Profile:   Emma Hahn is a 69 y.o. female with a hx of COPD, borderline DM, ongoing tobacco use, habitual alcohol use, and recent abnormal echo who is being seen today for the evaluation of new heart failure (EF 25-30%) and mitral/tricuspid regurgitation at the request of Dr. Katrinka Hahn.  History of Present Illness:   [name] has no formal cardiac history in our system but in May of this year began developing intermittent dyspnea so saw her primary care doctor. She reports she had an echo done around the [place] area and was told she needed to establish with a cardiologist. No records available. She was not interested in seeing a cardiologist at that time. She was provided a prescription for SL NTG. Since that time she's had progressive dyspnea both at rest, while sleeping and with exertion. She has also developed intermittent chest pain/pressure and appears somewhat agitated when asked to provide further details as it is extremely variable. It happens at least a few times a week - it can last minutes to up to a few hours at a time, then she "caves" and takes a few SL NTG + albuterol puff with resolution. It is definitely worse with exertion but also happens when lying back to sleep at night. She sometimes gets sweaty with this. She has to stop and rest at the top of the steps to catch her breath and also keeps a chair in her kitchen while cooking for this reason. Does feel sometimes her stomach may be swollen but otherwise denies any LE edema, syncope or palpitations. Currently smoking 4 cigarettes daily and drinks 3 days out of the week, either a pint of liquor or 2 beers - "depends on the mood I'm  in." She has also noticed a subtle unintentional weight loss of about 13lb - was 160lb when she lived in [place] last year, 158 in June, 152 recently, then 147 today.   Due to her progressive dyspnea she came to the ED where she was found to have LV systolic dysfunction with EF 25-30%, grade 2 DD, moderate pulmonary HTN, moderate biatrial enlargement, moderate to severe mitral regurgitation, and tricuspid regurgitation. Labs show BNP 1862, hsTroponin low/flat 28->32, mildly decreased abumin of 3.2, LDL 143, hypomagnesemia 1.5. She was given nebs, Solu-Medrol and IV Lasix thus far (80mg  last night, 40mg  this AM) and still reports dyspnea with even mild activity. She is currently chest pain free.   Past Medical History:  Diagnosis Date  . Borderline diabetes   . COPD (chronic obstructive pulmonary disease) (HCC)   . Habitual alcohol use   . Tobacco abuse     Past Surgical History:  Procedure Laterality Date  . TOTAL ABDOMINAL HYSTERECTOMY       Home Medications:  Prior to Admission medications   Medication Sig Start Date End Date Taking? Authorizing Provider  albuterol (VENTOLIN HFA) 108 (90 Base) MCG/ACT inhaler Inhale 1-2 puffs into the lungs every 6 (six) hours as needed for wheezing or shortness of breath.   Yes [provider]  nitroGLYCERIN (NITROSTAT) 0.4 MG SL tablet Place 0.4 mg under the tongue as needed for chest pain. 11/10/19  Yes [provider]    Inpatient Medications: Scheduled Meds: . aspirin EC  81 mg  Oral Daily  . enoxaparin (LOVENOX) injection  40 mg Subcutaneous Q24H  . folic acid  1 mg Oral Daily  . furosemide  40 mg Intravenous BID  . multivitamin with minerals  1 tablet Oral Daily  . potassium chloride  40 mEq Oral Once  . sodium chloride flush  3 mL Intravenous Q12H  . thiamine  100 mg Oral Daily   Or  . thiamine  100 mg Intravenous Daily   Continuous Infusions: . sodium chloride    . magnesium sulfate bolus IVPB     PRN Meds: sodium  chloride, acetaminophen, sodium chloride flush  Allergies:   No Known Allergies  Social History:   Social History   Socioeconomic History  . Marital status: Single    Spouse name: Not on file  . Number of children: Not on file  . Years of education: Not on file  . Highest education level: Not on file  Occupational History  . Not on file  Tobacco Use  . Smoking status: Current Every Day Smoker    Types: Cigarettes  . Smokeless tobacco: Never Used  Substance and Sexual Activity  . Alcohol use: Yes    Comment: Usually drinks vodka or gin or beer, 3 days out of the week  . Drug use: Never  . Sexual activity: Not on file  Other Topics Concern  . Not on file  Social History Narrative  . Not on file   Social Determinants of Health   Financial Resource Strain:   . Difficulty of Paying Living Expenses: Not on file  Food Insecurity:   . Worried About Programme researcher, broadcasting/film/video in the Last Year: Not on file  . Ran Out of Food in the Last Year: Not on file  Transportation Needs:   . Lack of Transportation (Medical): Not on file  . Lack of Transportation (Non-Medical): Not on file  Physical Activity:   . Days of Exercise per Week: Not on file  . Minutes of Exercise per Session: Not on file  Stress:   . Feeling of Stress : Not on file  Social Connections:   . Frequency of Communication with Friends and Family: Not on file  . Frequency of Social Gatherings with Friends and Family: Not on file  . Attends Religious Services: Not on file  . Active Member of Clubs or Organizations: Not on file  . Attends Banker Meetings: Not on file  . Marital Status: Not on file  Intimate Partner Violence:   . Fear of Current or Ex-Partner: Not on file  . Emotionally Abused: Not on file  . Physically Abused: Not on file  . Sexually Abused: Not on file    Family History:   Family History  Problem Relation Age of Onset  . Stroke Maternal Grandmother        ? Possible heart disease too  - patient unsure     ROS:  Please see the history of present illness. Has had white mucous cough for a while. Occasional abdominal pain. All other ROS reviewed and negative.     Physical Exam/Data:  Vital Signs. BP 133/77 (BP Location: Right Arm)   Pulse 95 (per tele monitor)   Temp (!) 97.3 F (36.3 C) (Oral)   Resp 16   Ht 5\' 4"  (1.626 m)   Wt 67.1 kg   SpO2 99% on 2L (confirmed with nurse)   BMI 25.39 kg/m   Intake/Output Summary (Last 24 hours) at 12/07/2019 1445 Last data filed  at 12/07/2019 1426 Gross per 24 hour  Intake 1080 ml  Output 1100 ml  Net -20 ml   Last 3 Weights 12/07/2019 12/06/2019  Weight (lbs) 147 lb 14.4 oz 148 lb 9.4 oz  Weight (kg) 67.087 kg 67.4 kg     Body mass index is 25.39 kg/m.  General: Well developed, well nourished AAF, in no acute distress Head: Normocephalic, atraumatic, sclera non-icteric, no xanthomas, nares are without discharge. Neck: Negative for carotid bruits. JVP not elevated. Lungs: Coarse BS bilaterally and generally diminished without wheezes, rales, or rhonchi. Breathing is unlabored. Heart: RRR S1 S2 without murmurs, rubs, or gallops.  Abdomen: Soft, non-tender, non-distended with normoactive bowel sounds. No rebound/guarding. Extremities: No clubbing or cyanosis. No edema. Distal pedal pulses are 2+ and equal bilaterally. Neuro: Alert and oriented X 3. Moves all extremities spontaneously. Psych:  Responds to questions appropriately with a normal affect.    EKG:  The EKG was personally reviewed and demonstrates: NSR 95bpm, probable LAE, LVH, NSST changes, prolonged QT interval by computer read-out but hand calculated   Telemetry:  Telemetry was personally reviewed and demonstrates:  NSR, brief occasional PVCs  Relevant CV Studies: 2D echo 12/06/19    1. Left ventricular ejection fraction, by estimation, is 25 to 30%. The  left ventricle has severely decreased function. The left ventricle  demonstrates  global hypokinesis. There is mild left ventricular  hypertrophy. Left ventricular diastolic parameters   are consistent with Grade II diastolic dysfunction (pseudonormalization).  Elevated left atrial pressure.   2. Right ventricular systolic function is normal. The right ventricular  size is normal. There is moderately elevated pulmonary artery systolic  pressure. The estimated right ventricular systolic pressure is 59.5 mmHg.   3. Left atrial size was moderately dilated.   4. Right atrial size was moderately dilated.   5. Moderate pleural effusion in the left lateral region.   6. The mitral valve is degenerative. Moderate to severe mitral valve  regurgitation. No evidence of mitral stenosis.   7. Tricuspid valve regurgitation is severe.   8. The aortic valve is normal in structure. Aortic valve regurgitation is  not visualized. No aortic stenosis is present.   9. The inferior vena cava is normal in size with greater than 50%  respiratory variability, suggesting right atrial pressure of 3 mmHg.     Laboratory Data:  High Sensitivity Troponin:   Recent Labs  Lab 12/06/19 1404 12/06/19 1551  TROPONINIHS 28* 32*     Chemistry Recent Labs  Lab 12/06/19 1404 12/07/19 0433  NA 137 137  K 3.7 3.5  CL 106 100  CO2 18* 22  GLUCOSE 149* 285*  BUN 15 21  CREATININE 0.81 1.07*  CALCIUM 8.9 9.7  GFRNONAA >60 56*  ANIONGAP 13 15    Recent Labs  Lab 12/06/19 1404  PROT 6.5  ALBUMIN 3.2*  AST 23  ALT 16  ALKPHOS 40  BILITOT 1.2   Hematology Recent Labs  Lab 12/06/19 1404  WBC 7.1  RBC 4.53  HGB 12.5  HCT 40.5  MCV 89.4  MCH 27.6  MCHC 30.9  RDW 15.0  PLT 186   BNP Recent Labs  Lab 12/06/19 1404  BNP 1,862.8*    DDimer No results for input(s): DDIMER in the last 168 hours.   Radiology/Studies:  DG Chest Portable 1 View  Result Date: 12/06/2019 CLINICAL DATA:  Shortness of breath, chest pain EXAM: PORTABLE CHEST 1 VIEW COMPARISON:  None. FINDINGS:  Cardiomegaly. Atherosclerotic calcification  of the aortic knob. Pulmonary vascular congestion. Increased interstitial markings throughout both lungs with patchy bibasilar opacities. No appreciable pleural fluid collection. No pneumothorax. IMPRESSION: Cardiomegaly with pulmonary vascular congestion and interstitial edema. Patchy bibasilar opacities may reflect alveolar edema versus infection. Electronically Signed   By: Duanne Guess D.O.   On: 12/06/2019 14:03   ECHOCARDIOGRAM COMPLETE  Result Date: 12/06/2019    ECHOCARDIOGRAM REPORT   Patient Name:   Emma Hahn Date of Exam: 12/06/2019 Medical Rec #:  [id number]    Height: Accession #:    [id number]   Weight: Date of Birth:  [birth year]    BSA: Patient Age:    69 years     BP:           155/96 mmHg Patient Gender: F            HR:           102 bpm. Exam Location:  Inpatient Procedure: 2D Echo Indications:    congestive heart failure  History:        Patient has prior history of Echocardiogram examinations. COPD,                 Mitral Valve Disease; Signs/Symptoms:Dyspnea.  Sonographer:    Delcie Roch Referring Phys: [id number] RONDELL A SMITH IMPRESSIONS  1. Left ventricular ejection fraction, by estimation, is 25 to 30%. The left ventricle has severely decreased function. The left ventricle demonstrates global hypokinesis. There is mild left ventricular hypertrophy. Left ventricular diastolic parameters  are consistent with Grade II diastolic dysfunction (pseudonormalization). Elevated left atrial pressure.  2. Right ventricular systolic function is normal. The right ventricular size is normal. There is moderately elevated pulmonary artery systolic pressure. The estimated right ventricular systolic pressure is 59.5 mmHg.  3. Left atrial size was moderately dilated.  4. Right atrial size was moderately dilated.  5. Moderate pleural effusion in the left lateral region.  6. The mitral valve is degenerative. Moderate to severe mitral valve regurgitation.  No evidence of mitral stenosis.  7. Tricuspid valve regurgitation is severe.  8. The aortic valve is normal in structure. Aortic valve regurgitation is not visualized. No aortic stenosis is present.  9. The inferior vena cava is normal in size with greater than 50% respiratory variability, suggesting right atrial pressure of 3 mmHg. FINDINGS  Left Ventricle: Left ventricular ejection fraction, by estimation, is 25 to 30%. The left ventricle has severely decreased function. The left ventricle demonstrates global hypokinesis. The left ventricular internal cavity size was normal in size. There is mild left ventricular hypertrophy. Left ventricular diastolic parameters are consistent with Grade II diastolic dysfunction (pseudonormalization). Elevated left atrial pressure. Right Ventricle: The right ventricular size is normal. No increase in right ventricular wall thickness. Right ventricular systolic function is normal. There is moderately elevated pulmonary artery systolic pressure. The tricuspid regurgitant velocity is 3.69 m/s, and with an assumed right atrial pressure of 5 mmHg, the estimated right ventricular systolic pressure is 59.5 mmHg. Left Atrium: Left atrial size was moderately dilated. Right Atrium: Right atrial size was moderately dilated. Pericardium: There is no evidence of pericardial effusion. Mitral Valve: The mitral valve is degenerative in appearance. There is moderate thickening of the mitral valve leaflet(s). Moderate to severe mitral valve regurgitation, with posteriorly-directed jet. No evidence of mitral valve stenosis. Tricuspid Valve: The tricuspid valve is normal in structure. Tricuspid valve regurgitation is severe. No evidence of tricuspid stenosis. Aortic Valve: The aortic valve is normal in structure. Aortic valve regurgitation  is not visualized. No aortic stenosis is present. Pulmonic Valve: The pulmonic valve was normal in structure. Pulmonic valve regurgitation is mild. No evidence of  pulmonic stenosis. Aorta: The aortic root is normal in size and structure. Venous: The inferior vena cava is normal in size with greater than 50% respiratory variability, suggesting right atrial pressure of 3 mmHg. IAS/Shunts: No atrial level shunt detected by color flow Doppler. Additional Comments: There is a moderate pleural effusion in the left lateral region.  LEFT VENTRICLE PLAX 2D LVIDd:         5.10 cm     Diastology LVIDs:         4.40 cm     LV e' medial:    7.94 cm/s LV PW:         1.20 cm     LV E/e' medial:  15.9 LV IVS:        1.00 cm     LV e' lateral:   6.85 cm/s LVOT diam:     1.70 cm     LV E/e' lateral: 18.4 LV SV:         29 LVOT Area:     2.27 cm  LV Volumes (MOD) LV vol d, MOD A4C: 97.9 ml LV vol s, MOD A4C: 69.6 ml LV SV MOD A4C:     97.9 ml RIGHT VENTRICLE            IVC RV S prime:     8.38 cm/s  IVC diam: 2.00 cm TAPSE (M-mode): 1.1 cm LEFT ATRIUM             RIGHT ATRIUM LA diam:        4.90 cm RA Area:     17.90 cm LA Vol (A2C):   74.2 ml RA Volume:   47.60 ml LA Vol (A4C):   86.5 ml LA Biplane Vol: 86.3 ml  AORTIC VALVE LVOT Vmax:   93.30 cm/s LVOT Vmean:  57.600 cm/s LVOT VTI:    0.129 m  AORTA Ao Root diam: 3.00 cm Ao Asc diam:  3.10 cm MITRAL VALVE                 TRICUSPID VALVE MV Area (PHT): 6.32 cm      TR Peak grad:   54.5 mmHg MV Decel Time: 120 msec      TR Vmax:        369.00 cm/s MR Peak grad:    117.9 mmHg MR Mean grad:    73.0 mmHg   SHUNTS MR Vmax:         543.00 cm/s Systemic VTI:  0.13 m MR Vmean:        403.0 cm/s  Systemic Diam: 1.70 cm MR PISA:         1.01 cm MR PISA Eff ROA: 7 mm MR PISA Radius:  0.40 cm MV E velocity: 126.00 cm/s MV A velocity: 47.60 cm/s MV E/A ratio:  2.65 Donato Schultz MD Electronically signed by Donato Schultz MD Signature Date/Time: 12/06/2019/6:08:48 PM    Final      Assessment and Plan:   1. 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 Suspect she would benefit from [facility] to define  coronary anatomy and pressures if she is willing - may need consideration for TEE as well given valvular disease. Dr. Tika Hannis spoke with her about cath and she is agreeable so we will arrange for tomorrow - hold off further diuresis for now in anticipation of cath tomorrow  - anticipate addition of GDMT as tolerated this admission - we'll tentatively add low dose BB and plan ARB or ARNI post cath - watch creatinine - rx CHF booklet and add fluid restriction to diet modifier  2. Chest pain concerning for unstable angina - low/flat troponins, arguing against active ACS but symptoms certainly suspicious for coronary disease especially given LV dysfunction - continue ASA, add statin - consider BB this admission - we'll hold off full dose heparin for now but can reconsider if she develops escalation of chest pain in-house   3. Tobacco/alcohol use - ultimate goal of cessation advised - will defer CIWA to primary team  4. Hyperlipidemia - LDL 143 - start atorvastatin (normal baseline LFTs)  5. COPD - pre-admission was instructed to f/u with pulmonology per notes but did not wish to do so - likely further diminishing her reserve  6. Possible prolonged QT interval  - noted on EKG although hand-calculated appears closer to 453ms (interval challenging to ascertain on telemetry due to low amplitude) - Mg 1.5 today - replete Mg with 4g today - K 3.5 today prior to Lasix - will give 40meq now and another this evening, then revisit labs in AM - f/u EKG in AM  7. Unintentional weight loss - 13lb from baseline over last 1 year - TSH wnl - patient previously told to get colonoscopy and mammogram as OP but elected to defer, will  need to reconsider after discharge      TIMI Risk Score for Unstable Angina or Non-ST Elevation MI:   The patient's TIMI risk score is 3, which indicates a 13% risk of all cause mortality, new or recurrent myocardial infarction or need for urgent revascularization in the next 14 days.  New York Heart Association (NYHA) Functional Class NYHA Class III -IV  For questions or updates, please contact [facility] Please consult [email or web address] for contact info under    Signed, Dayna N Dunn, PA-C  12/07/2019 2:45 PM  Personally seen and examined. Agree with APP above with the following comments: Briefly 69 F with HLD, Alcohol misuse, and tobacco use who presents with persistent chest pain and shortness of breath.  EF 25% with severe MR and moderate TR Patient notes CP despite 4 nitroglycerin.  DOE that progressed to resting SOB, improved with lasix through admission.  Exam notable for +2 right and left radial pulse; JVD with persistent V wave; II/IV holosystolic murmur Labs notable for  Personally reviewed relevant tests; creatinine 1.07; BNP 1862, Troponin 28-32 Would recommend LHC and RHC Discussed with patient and niece: Risks and benefits of cardiac catheterization have been discussed with the patient.  These include bleeding, infection, kidney damage, stroke, heart attack, death.  The patient understands these risks and is willing to proceed. Would keep NPO at midnight. Start low dose BB; will plan for aggressive up-titration of GDMT titration post cath Discussed tobacco and alcohol cessation  Elliot Simoneaux A Mariette Cowley, MD     

## 2019-12-07 NOTE — Assessment & Plan Note (Signed)
-  QTC on admission 510 -Monitor for any QT prolonging agents -Continue telemetry

## 2019-12-07 NOTE — Progress Notes (Signed)
PROGRESS NOTE    Emma Hahn   XHB:[id number]  DOB: [birth year]  DOA: 12/06/2019     1  PCP: Jackie Plum, MD  CC: SOB, CP  Hospital Course: Emma Hahn is a 69 y.o. female with PMH prediabetes, COPD, ongoing tobacco use.  She has not seen primary care in several years.  She did however present to a physician earlier this year around June due to shortness of breath and chest pain.  At that time she was started on multiple medications which she cannot remember other than nitroglycerin.  She was to follow-up with cardiology however did not do so.  She also ran out of refills of her medications therefore did not think she needed to be on them any further. She continues to smoke.  She also endorses occasional alcohol use at home but states no more than 1 or 2 drinks at a time for approximately 3-4 nights per week. She endorses increased swelling in her abdomen lately.  She also has difficulty sleeping at night due to inability to lay flat. On work-up in the ER she was found to be hypoxic and placed on 2 L nasal cannula oxygen.  BNP was elevated.  CXR showed cardiomegaly and interstitial edema. She was admitted for further cardiac work-up and cardiology evaluation.   Interval History:  Patient seen resting in bed this morning.  She endorses ongoing shortness of breath and abdominal swelling/distention.  She confirms history given on admission.  She says she has not seen a doctor in over 20 years except for when seen earlier this year.  She has been out of medications since approximately July and cannot recall any names of medications other than nitroglycerin which she states has been helping less lately.  Old records reviewed in assessment of this patient  ROS: Constitutional: negative for chills and fevers, Respiratory: positive for dyspnea on exertion, Cardiovascular: positive for exertional chest pressure/discomfort, fatigue and paroxysmal nocturnal dyspnea and Gastrointestinal:  negative for abdominal pain  Assessment & Plan: * Acute on chronic combined systolic and diastolic CHF (congestive heart failure) (HCC) - EF 25-30%, Gr II DD. Etiology possibly underlying CAD; lower chance that etoh use contributing large role but did counsel her to monitor intake; tobacco use also counseled for cessation - s/p lasix on admission; now on hold in setting of heart cath -Continue aspirin  Chest pain - troponin indeterminate; given her worsening SOB/CP at home this does suggest USA/crescendo angina - appreciate cardiology consult - follow up cath results; patient amenable and planned for 11/23 - continue asa; statin added per cardiology - further GDMT pending cath results  Prediabetes -A1c 5.8% -Continue diet control  Prolonged QT interval -QTC on admission 510 -Monitor for any QT prolonging agents -Continue telemetry  Tobacco abuse -Patient strongly encouraged on tobacco cessation    Antimicrobials: None  DVT prophylaxis: Lovenox Code Status: Full Family Communication: None present Disposition Plan: Status is: Inpatient  Remains inpatient appropriate because:Ongoing diagnostic testing needed not appropriate for outpatient work up, IV treatments appropriate due to intensity of illness or inability to take PO and Inpatient level of care appropriate due to severity of illness   Dispo: The patient is from: Home              Anticipated d/c is to: Home              Anticipated d/c date is: 2 days              Patient currently is  not medically stable to d/c.   Objective: Blood pressure 136/86, pulse (!) 103, temperature 97.6 F (36.4 C), temperature source Oral, resp. rate (!) 22, height 5\' 4"  (1.626 m), weight 67.1 kg, SpO2 100 %.  Examination: General appearance: alert, cooperative and no distress Head: Normocephalic, without obvious abnormality, atraumatic Eyes: EOMI Lungs: clear to auscultation bilaterally Heart: regular rate and rhythm and S1, S2  normal Abdomen: Mildly distended, soft, nontender, bowel sounds present Extremities: No lower extremity edema Skin: mobility and turgor normal Neurologic: Grossly normal  Consultants:   Cardiology  Procedures:   None  Data Reviewed: I have personally reviewed following labs and imaging studies Results for orders placed or performed during the hospital encounter of 12/06/19 (from the past 24 hour(s))  Urinalysis, Routine w reflex microscopic     Status: Abnormal   Collection Time: 12/06/19  5:16 PM  Result Value Ref Range   Color, Urine STRAW (A) YELLOW   APPearance CLEAR CLEAR   Specific Gravity, Urine 1.006 1.005 - 1.030   pH 5.0 5.0 - 8.0   Glucose, UA NEGATIVE NEGATIVE mg/dL   Hgb urine dipstick NEGATIVE NEGATIVE   Bilirubin Urine NEGATIVE NEGATIVE   Ketones, ur NEGATIVE NEGATIVE mg/dL   Protein, ur 30 (A) NEGATIVE mg/dL   Nitrite NEGATIVE NEGATIVE   Leukocytes,Ua TRACE (A) NEGATIVE   WBC, UA 0-5 0 - 5 WBC/hpf   Bacteria, UA NONE SEEN NONE SEEN   Squamous Epithelial / LPF 0-5 0 - 5   Mucus PRESENT    Hyaline Casts, UA PRESENT   HIV Antibody (routine testing w rflx)     Status: None   Collection Time: 12/07/19  4:33 AM  Result Value Ref Range   HIV Screen 4th Generation wRfx Non Reactive Non Reactive  TSH     Status: None   Collection Time: 12/07/19  4:33 AM  Result Value Ref Range   TSH 0.509 0.350 - 4.500 uIU/mL  Basic metabolic panel     Status: Abnormal   Collection Time: 12/07/19  4:33 AM  Result Value Ref Range   Sodium 137 135 - 145 mmol/L   Potassium 3.5 3.5 - 5.1 mmol/L   Chloride 100 98 - 111 mmol/L   CO2 22 22 - 32 mmol/L   Glucose, Bld 285 (H) 70 - 99 mg/dL   BUN 21 8 - 23 mg/dL   Creatinine, Ser 12/09/19 (H) 0.44 - 1.00 mg/dL   Calcium 9.7 8.9 - 9.93 mg/dL   GFR, Estimated 56 (L) >60 mL/min   Anion gap 15 5 - 15  Magnesium     Status: Abnormal   Collection Time: 12/07/19  4:33 AM  Result Value Ref Range   Magnesium 1.5 (L) 1.7 - 2.4 mg/dL   Hemoglobin 12/09/19     Status: Abnormal   Collection Time: 12/07/19  4:33 AM  Result Value Ref Range   Hgb A1c MFr Bld 5.8 (H) 4.8 - 5.6 %   Mean Plasma Glucose 119.76 mg/dL  Lipid panel     Status: Abnormal   Collection Time: 12/07/19  4:33 AM  Result Value Ref Range   Cholesterol 213 (H) 0 - 200 mg/dL   Triglycerides 45 12/09/19 mg/dL   HDL 61 <893 mg/dL   Total CHOL/HDL Ratio 3.5 RATIO   VLDL 9 0 - 40 mg/dL   LDL Cholesterol >81 (H) 0 - 99 mg/dL  Phosphorus     Status: None   Collection Time: 12/07/19  4:33 AM  Result Value Ref  Range   Phosphorus 3.3 2.5 - 4.6 mg/dL  Glucose, capillary     Status: Abnormal   Collection Time: 12/07/19 12:13 PM  Result Value Ref Range   Glucose-Capillary 145 (H) 70 - 99 mg/dL    Recent Results (from the past 240 hour(s))  Respiratory Panel by RT PCR (Flu A&B, Covid) - Nasopharyngeal Swab     Status: None   Collection Time: 12/06/19  2:00 PM   Specimen: Nasopharyngeal Swab; Nasopharyngeal(NP) swabs in vial transport medium  Result Value Ref Range Status   SARS Coronavirus 2 by RT PCR NEGATIVE NEGATIVE Final    Comment: (NOTE) SARS-CoV-2 target nucleic acids are NOT DETECTED.  The SARS-CoV-2 RNA is generally detectable in upper respiratoy specimens during the acute phase of infection. The lowest concentration of SARS-CoV-2 viral copies this assay can detect is 131 copies/mL. A negative result does not preclude SARS-Cov-2 infection and should not be used as the sole basis for treatment or other patient management decisions. A negative result may occur with  improper specimen collection/handling, submission of specimen other than nasopharyngeal swab, presence of viral mutation(s) within the areas targeted by this assay, and inadequate number of viral copies (<131 copies/mL). A negative result must be combined with clinical observations, patient history, and epidemiological information. The expected result is Negative.  Fact Sheet for Patients:   https://www.moore.com/  Fact Sheet for Healthcare Providers:  https://www.young.biz/  This test is no t yet approved or cleared by the [place] FDA and  has been authorized for detection and/or diagnosis of SARS-CoV-2 by FDA under an Emergency Use Authorization (EUA). This EUA will remain  in effect (meaning this test can be used) for the duration of the COVID-19 declaration under Section 564(b)(1) of the Act, 21 U.S.C. section 360bbb-3(b)(1), unless the authorization is terminated or revoked sooner.     Influenza A by PCR NEGATIVE NEGATIVE Final   Influenza B by PCR NEGATIVE NEGATIVE Final    Comment: (NOTE) The Xpert Xpress SARS-CoV-2/FLU/RSV assay is intended as an aid in  the diagnosis of influenza from Nasopharyngeal swab specimens and  should not be used as a sole basis for treatment. Nasal washings and  aspirates are unacceptable for Xpert Xpress SARS-CoV-2/FLU/RSV  testing.  Fact Sheet for Patients: https://www.moore.com/  Fact Sheet for Healthcare Providers: https://www.young.biz/  This test is not yet approved or cleared by the [place] FDA and  has been authorized for detection and/or diagnosis of SARS-CoV-2 by  FDA under an Emergency Use Authorization (EUA). This EUA will remain  in effect (meaning this test can be used) for the duration of the  Covid-19 declaration under Section 564(b)(1) of the Act, 21  U.S.C. section 360bbb-3(b)(1), unless the authorization is  terminated or revoked. Performed at [facility], [address], [place], [place] [address]      Radiology Studies: DG Chest Portable 1 View  Result Date: 12/06/2019 CLINICAL DATA:  Shortness of breath, chest pain EXAM: PORTABLE CHEST 1 VIEW COMPARISON:  None. FINDINGS: Cardiomegaly. Atherosclerotic calcification of the aortic knob. Pulmonary vascular congestion. Increased interstitial markings throughout  both lungs with patchy bibasilar opacities. No appreciable pleural fluid collection. No pneumothorax. IMPRESSION: Cardiomegaly with pulmonary vascular congestion and interstitial edema. Patchy bibasilar opacities may reflect alveolar edema versus infection. Electronically Signed   By: Duanne Guess D.O.   On: 12/06/2019 14:03   ECHOCARDIOGRAM COMPLETE  Result Date: 12/06/2019    ECHOCARDIOGRAM REPORT   Patient Name:   [name] Date of Exam:  12/06/2019 Medical Rec #:  [id number]    Height: Accession #:    [id number]   Weight: Date of Birth:  02/06/1950    BSA: Patient Age:    69 years     BP:           155/96 mmHg Patient Gender: F            HR:           102 bpm. Exam Location:  Inpatient Procedure: 2D Echo Indications:    congestive heart failure  History:        Patient has prior history of Echocardiogram examinations. COPD,                 Mitral Valve Disease; Signs/Symptoms:Dyspnea.  Sonographer:    Delcie Roch Referring Phys: [id number] RONDELL A SMITH IMPRESSIONS  1. Left ventricular ejection fraction, by estimation, is 25 to 30%. The left ventricle has severely decreased function. The left ventricle demonstrates global hypokinesis. There is mild left ventricular hypertrophy. Left ventricular diastolic parameters  are consistent with Grade II diastolic dysfunction (pseudonormalization). Elevated left atrial pressure.  2. Right ventricular systolic function is normal. The right ventricular size is normal. There is moderately elevated pulmonary artery systolic pressure. The estimated right ventricular systolic pressure is 59.5 mmHg.  3. Left atrial size was moderately dilated.  4. Right atrial size was moderately dilated.  5. Moderate pleural effusion in the left lateral region.  6. The mitral valve is degenerative. Moderate to severe mitral valve regurgitation. No evidence of mitral stenosis.  7. Tricuspid valve regurgitation is severe.  8. The aortic valve is normal in structure. Aortic valve  regurgitation is not visualized. No aortic stenosis is present.  9. The inferior vena cava is normal in size with greater than 50% respiratory variability, suggesting right atrial pressure of 3 mmHg. FINDINGS  Left Ventricle: Left ventricular ejection fraction, by estimation, is 25 to 30%. The left ventricle has severely decreased function. The left ventricle demonstrates global hypokinesis. The left ventricular internal cavity size was normal in size. There is mild left ventricular hypertrophy. Left ventricular diastolic parameters are consistent with Grade II diastolic dysfunction (pseudonormalization). Elevated left atrial pressure. Right Ventricle: The right ventricular size is normal. No increase in right ventricular wall thickness. Right ventricular systolic function is normal. There is moderately elevated pulmonary artery systolic pressure. The tricuspid regurgitant velocity is 3.69 m/s, and with an assumed right atrial pressure of 5 mmHg, the estimated right ventricular systolic pressure is 59.5 mmHg. Left Atrium: Left atrial size was moderately dilated. Right Atrium: Right atrial size was moderately dilated. Pericardium: There is no evidence of pericardial effusion. Mitral Valve: The mitral valve is degenerative in appearance. There is moderate thickening of the mitral valve leaflet(s). Moderate to severe mitral valve regurgitation, with posteriorly-directed jet. No evidence of mitral valve stenosis. Tricuspid Valve: The tricuspid valve is normal in structure. Tricuspid valve regurgitation is severe. No evidence of tricuspid stenosis. Aortic Valve: The aortic valve is normal in structure. Aortic valve regurgitation is not visualized. No aortic stenosis is present. Pulmonic Valve: The pulmonic valve was normal in structure. Pulmonic valve regurgitation is mild. No evidence of pulmonic stenosis. Aorta: The aortic root is normal in size and structure. Venous: The inferior vena cava is normal in size with  greater than 50% respiratory variability, suggesting right atrial pressure of 3 mmHg. IAS/Shunts: No atrial level shunt detected by color flow Doppler. Additional Comments: There is a moderate pleural effusion in the  left lateral region.  LEFT VENTRICLE PLAX 2D LVIDd:         5.10 cm     Diastology LVIDs:         4.40 cm     LV e' medial:    7.94 cm/s LV PW:         1.20 cm     LV E/e' medial:  15.9 LV IVS:        1.00 cm     LV e' lateral:   6.85 cm/s LVOT diam:     1.70 cm     LV E/e' lateral: 18.4 LV SV:         29 LVOT Area:     2.27 cm  LV Volumes (MOD) LV vol d, MOD A4C: 97.9 ml LV vol s, MOD A4C: 69.6 ml LV SV MOD A4C:     97.9 ml RIGHT VENTRICLE            IVC RV S prime:     8.38 cm/s  IVC diam: 2.00 cm TAPSE (M-mode): 1.1 cm LEFT ATRIUM             RIGHT ATRIUM LA diam:        4.90 cm RA Area:     17.90 cm LA Vol (A2C):   74.2 ml RA Volume:   47.60 ml LA Vol (A4C):   86.5 ml LA Biplane Vol: 86.3 ml  AORTIC VALVE LVOT Vmax:   93.30 cm/s LVOT Vmean:  57.600 cm/s LVOT VTI:    0.129 m  AORTA Ao Root diam: 3.00 cm Ao Asc diam:  3.10 cm MITRAL VALVE                 TRICUSPID VALVE MV Area (PHT): 6.32 cm      TR Peak grad:   54.5 mmHg MV Decel Time: 120 msec      TR Vmax:        369.00 cm/s MR Peak grad:    117.9 mmHg MR Mean grad:    73.0 mmHg   SHUNTS MR Vmax:         543.00 cm/s Systemic VTI:  0.13 m MR Vmean:        403.0 cm/s  Systemic Diam: 1.70 cm MR PISA:         1.01 cm MR PISA Eff ROA: 7 mm MR PISA Radius:  0.40 cm MV E velocity: 126.00 cm/s MV A velocity: 47.60 cm/s MV E/A ratio:  2.65 Donato Schultz MD Electronically signed by Donato Schultz MD Signature Date/Time: 12/06/2019/6:08:48 PM    Final    DG Chest Portable 1 View  Final Result      Scheduled Meds:  aspirin EC  81 mg Oral Daily   atorvastatin  40 mg Oral Daily   enoxaparin (LOVENOX) injection  40 mg Subcutaneous Q24H   folic acid  1 mg Oral Daily   metoprolol tartrate  12.5 mg Oral BID   multivitamin with minerals  1 tablet  Oral Daily   potassium chloride  40 mEq Oral BID   sodium chloride flush  3 mL Intravenous Q12H   sodium chloride flush  3 mL Intravenous Q12H   thiamine  100 mg Oral Daily   Or   thiamine  100 mg Intravenous Daily   PRN Meds: sodium chloride, acetaminophen, sodium chloride flush Continuous Infusions:  sodium chloride     magnesium sulfate bolus IVPB 4 g (12/07/19 1504)     LOS: 1 day  Time spent: Greater than 50% of the 35 minute visit was spent in counseling/coordination of care for the patient as laid out in the A&P.   Lewie Chamber, MD [facility] 12/07/2019, 4:51 PM

## 2019-12-07 NOTE — Assessment & Plan Note (Addendum)
-  Patient strongly encouraged on tobacco cessation; states that she is down to approximately 4 cigarettes/day - patient refusing nicotine patch; currently has a straw cut in half that she is pretending is a cigarette which seems to be satisfying the urge

## 2019-12-07 NOTE — Hospital Course (Addendum)
Emma Hahn is a 69 y.o. female with PMH prediabetes, COPD, ongoing tobacco use.  She has not seen primary care in several years.  She did however present to a physician earlier this year around June due to shortness of breath and chest pain.  At that time she was started on multiple medications which she cannot remember other than nitroglycerin.  She was to follow-up with cardiology however did not do so.  She also ran out of refills of her medications therefore did not think she needed to be on them any further. She continues to smoke.  She also endorses occasional alcohol use at home but states no more than 1 or 2 drinks at a time for approximately 3-4 nights per week. She endorses increased swelling in her abdomen lately.  She also has difficulty sleeping at night due to inability to lay flat. On work-up in the ER she was found to be hypoxic and placed on 2 L nasal cannula oxygen.  BNP was elevated.  CXR showed cardiomegaly and interstitial edema. She was admitted for further cardiac work-up and cardiology evaluation. She was evaluated by cardiology with recommendations for heart catheterization and underwent on 11/23. She had no significant coronary disease.  PWCP was elevated.  In setting of her neg cath, she was recommended to undergo TEE to further evaluate. TEE on 11/24 showed severe MR with recommendations for evaluation from the structural heart team. At discharge she was continued on oral Lasix.  She will have close outpatient follow-up with cardiology and the structural heart team for further plans regarding repair of severe MR. Patient was anxious and amenable for discharge home.  Importance of medication compliance was stressed to her prior to discharge as well.

## 2019-12-07 NOTE — Assessment & Plan Note (Addendum)
-   EF 25-30%, Gr II DD. Etiology possibly underlying CAD; lower chance that etoh use contributing large role but did counsel her to monitor intake; tobacco use also counseled for cessation - s/p lasix on admission; now resumed after cath per cardiology  -Continue aspirin -Patient will undergo TEE on 12/09/2019 to further evaluate: Severe MR found, patient referred to structural heart team for outpatient repair

## 2019-12-07 NOTE — Plan of Care (Signed)
  Problem: Education: Goal: Knowledge of General Education information will improve Description Including pain rating scale, medication(s)/side effects and non-pharmacologic comfort measures Outcome: Progressing   Problem: Health Behavior/Discharge Planning: Goal: Ability to manage health-related needs will improve Outcome: Progressing   

## 2019-12-07 NOTE — Progress Notes (Signed)
ReDS Clip Diuretic Study Pt study # [id number]  Your patient is in the Blinded arm of the ReDS Clip Diuretic study.  Your patient has had a ReDS reading and the reading has been transmitted to the cloud.   Thank You   The research team   Sharen Hones, PharmD, BCPS Heart Failure Stewardship Pharmacist Phone [phone]  Please check [email or web address] for unit-specific pharmacist phone numbers

## 2019-12-07 NOTE — H&P (View-Only) (Signed)
Cardiology Consultation:   Patient ID: Emma Hahn MRN: [id number]; DOB: Nov 03, 1950  Admit date: 12/06/2019 Date of Consult: 12/07/2019  Primary Care Provider: Jackie Plum, MD [facility] Cardiologist: [name], MD (new) [facility] Electrophysiologist:  None    Patient Profile:   Emma Hahn is a 69 y.o. female with a hx of COPD, borderline DM, ongoing tobacco use, habitual alcohol use, and recent abnormal echo who is being seen today for the evaluation of new heart failure (EF 25-30%) and mitral/tricuspid regurgitation at the request of Dr. Katrinka Blazing.  History of Present Illness:   Emma Hahn has no formal cardiac history in our system but in May of this year began developing intermittent dyspnea so saw her primary care doctor. She reports she had an echo done around the [place] area and was told she needed to establish with a cardiologist. No records available. She was not interested in seeing a cardiologist at that time. She was provided a prescription for SL NTG. Since that time she's had progressive dyspnea both at rest, while sleeping and with exertion. She has also developed intermittent chest pain/pressure and appears somewhat agitated when asked to provide further details as it is extremely variable. It happens at least a few times a week - it can last minutes to up to a few hours at a time, then she "caves" and takes a few SL NTG + albuterol puff with resolution. It is definitely worse with exertion but also happens when lying back to sleep at night. She sometimes gets sweaty with this. She has to stop and rest at the top of the steps to catch her breath and also keeps a chair in her kitchen while cooking for this reason. Does feel sometimes her stomach may be swollen but otherwise denies any LE edema, syncope or palpitations. Currently smoking 4 cigarettes daily and drinks 3 days out of the week, either a pint of liquor or 2 beers - "depends on the mood I'm  in." She has also noticed a subtle unintentional weight loss of about 13lb - was 160lb when she lived in [place] last year, 158 in June, 152 recently, then 147 today.   Due to her progressive dyspnea she came to the ED where she was found to have LV systolic dysfunction with EF 25-30%, grade 2 DD, moderate pulmonary HTN, moderate biatrial enlargement, moderate to severe mitral regurgitation, and tricuspid regurgitation. Labs show BNP 1862, hsTroponin low/flat 28->32, mildly decreased abumin of 3.2, LDL 143, hypomagnesemia 1.5. She was given nebs, Solu-Medrol and IV Lasix thus far (80mg  last night, 40mg  this AM) and still reports dyspnea with even mild activity. She is currently chest pain free.   Past Medical History:  Diagnosis Date  . Borderline diabetes   . COPD (chronic obstructive pulmonary disease) (HCC)   . Habitual alcohol use   . Tobacco abuse     Past Surgical History:  Procedure Laterality Date  . TOTAL ABDOMINAL HYSTERECTOMY       Home Medications:  Prior to Admission medications   Medication Sig Start Date End Date Taking? Authorizing Provider  albuterol (VENTOLIN HFA) 108 (90 Base) MCG/ACT inhaler Inhale 1-2 puffs into the lungs every 6 (six) hours as needed for wheezing or shortness of breath.   Yes [provider]  nitroGLYCERIN (NITROSTAT) 0.4 MG SL tablet Place 0.4 mg under the tongue as needed for chest pain. 11/10/19  Yes [provider]    Inpatient Medications: Scheduled Meds: . aspirin EC  81 mg  Oral Daily  . enoxaparin (LOVENOX) injection  40 mg Subcutaneous Q24H  . folic acid  1 mg Oral Daily  . furosemide  40 mg Intravenous BID  . multivitamin with minerals  1 tablet Oral Daily  . potassium chloride  40 mEq Oral Once  . sodium chloride flush  3 mL Intravenous Q12H  . thiamine  100 mg Oral Daily   Or  . thiamine  100 mg Intravenous Daily   Continuous Infusions: . sodium chloride    . magnesium sulfate bolus IVPB     PRN Meds: sodium  chloride, acetaminophen, sodium chloride flush  Allergies:   No Known Allergies  Social History:   Social History   Socioeconomic History  . Marital status: Single    Spouse name: Not on file  . Number of children: Not on file  . Years of education: Not on file  . Highest education level: Not on file  Occupational History  . Not on file  Tobacco Use  . Smoking status: Current Every Day Smoker    Types: Cigarettes  . Smokeless tobacco: Never Used  Substance and Sexual Activity  . Alcohol use: Yes    Comment: Usually drinks vodka or gin or beer, 3 days out of the week  . Drug use: Never  . Sexual activity: Not on file  Other Topics Concern  . Not on file  Social History Narrative  . Not on file   Social Determinants of Health   Financial Resource Strain:   . Difficulty of Paying Living Expenses: Not on file  Food Insecurity:   . Worried About Programme researcher, broadcasting/film/video in the Last Year: Not on file  . Ran Out of Food in the Last Year: Not on file  Transportation Needs:   . Lack of Transportation (Medical): Not on file  . Lack of Transportation (Non-Medical): Not on file  Physical Activity:   . Days of Exercise per Week: Not on file  . Minutes of Exercise per Session: Not on file  Stress:   . Feeling of Stress : Not on file  Social Connections:   . Frequency of Communication with Friends and Family: Not on file  . Frequency of Social Gatherings with Friends and Family: Not on file  . Attends Religious Services: Not on file  . Active Member of Clubs or Organizations: Not on file  . Attends Banker Meetings: Not on file  . Marital Status: Not on file  Intimate Partner Violence:   . Fear of Current or Ex-Partner: Not on file  . Emotionally Abused: Not on file  . Physically Abused: Not on file  . Sexually Abused: Not on file    Family History:   Family History  Problem Relation Age of Onset  . Stroke Maternal Grandmother        ? Possible heart disease too  - patient unsure     ROS:  Please see the history of present illness. Has had white mucous cough for a while. Occasional abdominal pain. All other ROS reviewed and negative.     Physical Exam/Data:  Vital Signs. BP 133/77 (BP Location: Right Arm)   Pulse 95 (per tele monitor)   Temp (!) 97.3 F (36.3 C) (Oral)   Resp 16   Ht 5\' 4"  (1.626 m)   Wt 67.1 kg   SpO2 99% on 2L (confirmed with nurse)   BMI 25.39 kg/m   Intake/Output Summary (Last 24 hours) at 12/07/2019 1445 Last data filed  at 12/07/2019 1426 Gross per 24 hour  Intake 1080 ml  Output 1100 ml  Net -20 ml   Last 3 Weights 12/07/2019 12/06/2019  Weight (lbs) 147 lb 14.4 oz 148 lb 9.4 oz  Weight (kg) 67.087 kg 67.4 kg     Body mass index is 25.39 kg/m.  General: Well developed, well nourished AAF, in no acute distress Head: Normocephalic, atraumatic, sclera non-icteric, no xanthomas, nares are without discharge. Neck: Negative for carotid bruits. JVP not elevated. Lungs: Coarse BS bilaterally and generally diminished without wheezes, rales, or rhonchi. Breathing is unlabored. Heart: RRR S1 S2 without murmurs, rubs, or gallops.  Abdomen: Soft, non-tender, non-distended with normoactive bowel sounds. No rebound/guarding. Extremities: No clubbing or cyanosis. No edema. Distal pedal pulses are 2+ and equal bilaterally. Neuro: Alert and oriented X 3. Moves all extremities spontaneously. Psych:  Responds to questions appropriately with a normal affect.    EKG:  The EKG was personally reviewed and demonstrates: NSR 95bpm, probable LAE, LVH, NSST changes, prolonged QT interval by computer read-out but hand calculated   Telemetry:  Telemetry was personally reviewed and demonstrates:  NSR, brief occasional PVCs  Relevant CV Studies: 2D echo 12/06/19    1. Left ventricular ejection fraction, by estimation, is 25 to 30%. The  left ventricle has severely decreased function. The left ventricle  demonstrates  global hypokinesis. There is mild left ventricular  hypertrophy. Left ventricular diastolic parameters   are consistent with Grade II diastolic dysfunction (pseudonormalization).  Elevated left atrial pressure.   2. Right ventricular systolic function is normal. The right ventricular  size is normal. There is moderately elevated pulmonary artery systolic  pressure. The estimated right ventricular systolic pressure is 59.5 mmHg.   3. Left atrial size was moderately dilated.   4. Right atrial size was moderately dilated.   5. Moderate pleural effusion in the left lateral region.   6. The mitral valve is degenerative. Moderate to severe mitral valve  regurgitation. No evidence of mitral stenosis.   7. Tricuspid valve regurgitation is severe.   8. The aortic valve is normal in structure. Aortic valve regurgitation is  not visualized. No aortic stenosis is present.   9. The inferior vena cava is normal in size with greater than 50%  respiratory variability, suggesting right atrial pressure of 3 mmHg.     Laboratory Data:  High Sensitivity Troponin:   Recent Labs  Lab 12/06/19 1404 12/06/19 1551  TROPONINIHS 28* 32*     Chemistry Recent Labs  Lab 12/06/19 1404 12/07/19 0433  NA 137 137  K 3.7 3.5  CL 106 100  CO2 18* 22  GLUCOSE 149* 285*  BUN 15 21  CREATININE 0.81 1.07*  CALCIUM 8.9 9.7  GFRNONAA >60 56*  ANIONGAP 13 15    Recent Labs  Lab 12/06/19 1404  PROT 6.5  ALBUMIN 3.2*  AST 23  ALT 16  ALKPHOS 40  BILITOT 1.2   Hematology Recent Labs  Lab 12/06/19 1404  WBC 7.1  RBC 4.53  HGB 12.5  HCT 40.5  MCV 89.4  MCH 27.6  MCHC 30.9  RDW 15.0  PLT 186   BNP Recent Labs  Lab 12/06/19 1404  BNP 1,862.8*    DDimer No results for input(s): DDIMER in the last 168 hours.   Radiology/Studies:  DG Chest Portable 1 View  Result Date: 12/06/2019 CLINICAL DATA:  Shortness of breath, chest pain EXAM: PORTABLE CHEST 1 VIEW COMPARISON:  None. FINDINGS:  Cardiomegaly. Atherosclerotic calcification  of the aortic knob. Pulmonary vascular congestion. Increased interstitial markings throughout both lungs with patchy bibasilar opacities. No appreciable pleural fluid collection. No pneumothorax. IMPRESSION: Cardiomegaly with pulmonary vascular congestion and interstitial edema. Patchy bibasilar opacities may reflect alveolar edema versus infection. Electronically Signed   By: Duanne Guess D.O.   On: 12/06/2019 14:03   ECHOCARDIOGRAM COMPLETE  Result Date: 12/06/2019    ECHOCARDIOGRAM REPORT   Patient Name:   Emma Hahn Date of Exam: 12/06/2019 Medical Rec #:  [id number]    Height: Accession #:    [id number]   Weight: Date of Birth:  [birth year]    BSA: Patient Age:    69 years     BP:           155/96 mmHg Patient Gender: F            HR:           102 bpm. Exam Location:  Inpatient Procedure: 2D Echo Indications:    congestive heart failure  History:        Patient has prior history of Echocardiogram examinations. COPD,                 Mitral Valve Disease; Signs/Symptoms:Dyspnea.  Sonographer:    Delcie Roch Referring Phys: [id number] RONDELL A SMITH IMPRESSIONS  1. Left ventricular ejection fraction, by estimation, is 25 to 30%. The left ventricle has severely decreased function. The left ventricle demonstrates global hypokinesis. There is mild left ventricular hypertrophy. Left ventricular diastolic parameters  are consistent with Grade II diastolic dysfunction (pseudonormalization). Elevated left atrial pressure.  2. Right ventricular systolic function is normal. The right ventricular size is normal. There is moderately elevated pulmonary artery systolic pressure. The estimated right ventricular systolic pressure is 59.5 mmHg.  3. Left atrial size was moderately dilated.  4. Right atrial size was moderately dilated.  5. Moderate pleural effusion in the left lateral region.  6. The mitral valve is degenerative. Moderate to severe mitral valve regurgitation.  No evidence of mitral stenosis.  7. Tricuspid valve regurgitation is severe.  8. The aortic valve is normal in structure. Aortic valve regurgitation is not visualized. No aortic stenosis is present.  9. The inferior vena cava is normal in size with greater than 50% respiratory variability, suggesting right atrial pressure of 3 mmHg. FINDINGS  Left Ventricle: Left ventricular ejection fraction, by estimation, is 25 to 30%. The left ventricle has severely decreased function. The left ventricle demonstrates global hypokinesis. The left ventricular internal cavity size was normal in size. There is mild left ventricular hypertrophy. Left ventricular diastolic parameters are consistent with Grade II diastolic dysfunction (pseudonormalization). Elevated left atrial pressure. Right Ventricle: The right ventricular size is normal. No increase in right ventricular wall thickness. Right ventricular systolic function is normal. There is moderately elevated pulmonary artery systolic pressure. The tricuspid regurgitant velocity is 3.69 m/s, and with an assumed right atrial pressure of 5 mmHg, the estimated right ventricular systolic pressure is 59.5 mmHg. Left Atrium: Left atrial size was moderately dilated. Right Atrium: Right atrial size was moderately dilated. Pericardium: There is no evidence of pericardial effusion. Mitral Valve: The mitral valve is degenerative in appearance. There is moderate thickening of the mitral valve leaflet(s). Moderate to severe mitral valve regurgitation, with posteriorly-directed jet. No evidence of mitral valve stenosis. Tricuspid Valve: The tricuspid valve is normal in structure. Tricuspid valve regurgitation is severe. No evidence of tricuspid stenosis. Aortic Valve: The aortic valve is normal in structure. Aortic valve regurgitation  is not visualized. No aortic stenosis is present. Pulmonic Valve: The pulmonic valve was normal in structure. Pulmonic valve regurgitation is mild. No evidence of  pulmonic stenosis. Aorta: The aortic root is normal in size and structure. Venous: The inferior vena cava is normal in size with greater than 50% respiratory variability, suggesting right atrial pressure of 3 mmHg. IAS/Shunts: No atrial level shunt detected by color flow Doppler. Additional Comments: There is a moderate pleural effusion in the left lateral region.  LEFT VENTRICLE PLAX 2D LVIDd:         5.10 cm     Diastology LVIDs:         4.40 cm     LV e' medial:    7.94 cm/s LV PW:         1.20 cm     LV E/e' medial:  15.9 LV IVS:        1.00 cm     LV e' lateral:   6.85 cm/s LVOT diam:     1.70 cm     LV E/e' lateral: 18.4 LV SV:         29 LVOT Area:     2.27 cm  LV Volumes (MOD) LV vol d, MOD A4C: 97.9 ml LV vol s, MOD A4C: 69.6 ml LV SV MOD A4C:     97.9 ml RIGHT VENTRICLE            IVC RV S prime:     8.38 cm/s  IVC diam: 2.00 cm TAPSE (M-mode): 1.1 cm LEFT ATRIUM             RIGHT ATRIUM LA diam:        4.90 cm RA Area:     17.90 cm LA Vol (A2C):   74.2 ml RA Volume:   47.60 ml LA Vol (A4C):   86.5 ml LA Biplane Vol: 86.3 ml  AORTIC VALVE LVOT Vmax:   93.30 cm/s LVOT Vmean:  57.600 cm/s LVOT VTI:    0.129 m  AORTA Ao Root diam: 3.00 cm Ao Asc diam:  3.10 cm MITRAL VALVE                 TRICUSPID VALVE MV Area (PHT): 6.32 cm      TR Peak grad:   54.5 mmHg MV Decel Time: 120 msec      TR Vmax:        369.00 cm/s MR Peak grad:    117.9 mmHg MR Mean grad:    73.0 mmHg   SHUNTS MR Vmax:         543.00 cm/s Systemic VTI:  0.13 m MR Vmean:        403.0 cm/s  Systemic Diam: 1.70 cm MR PISA:         1.01 cm MR PISA Eff ROA: 7 mm MR PISA Radius:  0.40 cm MV E velocity: 126.00 cm/s MV A velocity: 47.60 cm/s MV E/A ratio:  2.65 Donato Schultz MD Electronically signed by Donato Schultz MD Signature Date/Time: 12/06/2019/6:08:48 PM    Final      Assessment and Plan:   1. 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 Suspect she would benefit from [facility] to define  coronary anatomy and pressures if she is willing - may need consideration for TEE as well given valvular disease. Dr. [name] spoke with her about cath and she is agreeable so we will arrange for tomorrow - hold off further diuresis for now in anticipation of cath tomorrow  - anticipate addition of GDMT as tolerated this admission - we'll tentatively add low dose BB and plan ARB or ARNI post cath - watch creatinine - rx CHF booklet and add fluid restriction to diet modifier  2. Chest pain concerning for unstable angina - low/flat troponins, arguing against active ACS but symptoms certainly suspicious for coronary disease especially given LV dysfunction - continue ASA, add statin - consider BB this admission - we'll hold off full dose heparin for now but can reconsider if she develops escalation of chest pain in-house   3. Tobacco/alcohol use - ultimate goal of cessation advised - will defer CIWA to primary team  4. Hyperlipidemia - LDL 143 - start atorvastatin (normal baseline LFTs)  5. COPD - pre-admission was instructed to f/u with pulmonology per notes but did not wish to do so - likely further diminishing her reserve  6. Possible prolonged QT interval  - noted on EKG although hand-calculated appears closer to (interval challenging to ascertain on telemetry due to low amplitude) - Mg 1.5 today - replete Mg with 4g today - K 3.5 today prior to Lasix - will give now and another this evening, then revisit labs in AM - f/u EKG in AM  7. Unintentional weight loss - 13lb from baseline over last 1 year - TSH wnl - patient previously told to get colonoscopy and mammogram as OP but elected to defer, will  need to reconsider after discharge      TIMI Risk Score for Unstable Angina or Non-ST Elevation MI:   The patient's TIMI risk score is 3, which indicates a 13% risk of all cause mortality, new or recurrent myocardial infarction or need for urgent revascularization in the next 14 days.  New York Heart Association (NYHA) Functional Class NYHA Class III -IV  For questions or updates, please contact [facility] Please consult [email or web address] for contact info under    Signed, Laurann Montana, PA-C  12/07/2019 2:45 PM  Personally seen and examined. Agree with APP above with the following comments: Briefly 15 F with HLD, Alcohol misuse, and tobacco use who presents with persistent chest pain and shortness of breath.  EF 25% with severe MR and moderate TR Patient notes CP despite 4 nitroglycerin.  DOE that progressed to resting SOB, improved with lasix through admission.  Exam notable for +2 right and left radial pulse; JVD with persistent V wave; II/IV holosystolic murmur Labs notable for  Personally reviewed relevant tests; creatinine 1.07; BNP 1862, Troponin 28-32 Would recommend LHC and RHC Discussed with patient and niece: Risks and benefits of cardiac catheterization have been discussed with the patient.  These include bleeding, infection, kidney damage, stroke, heart attack, death.  The patient understands these risks and is willing to proceed. Would keep NPO at midnight. Start low dose BB; will plan for aggressive up-titration of GDMT titration post cath Discussed tobacco and alcohol cessation  [name], MD

## 2019-12-07 NOTE — Assessment & Plan Note (Signed)
-  A1c 5.8% -Continue diet control 

## 2019-12-07 NOTE — Assessment & Plan Note (Addendum)
-   troponin indeterminate; given her worsening SOB/CP at home this does suggest USA/crescendo angina - appreciate cardiology consult - cath surprisingly negative for ischemic heart disease - continue asa; statin added per cardiology - TEE  Revealed severe MR; patient being referred for further outpt followup for repair

## 2019-12-08 ENCOUNTER — Other Ambulatory Visit: Payer: Self-pay

## 2019-12-08 ENCOUNTER — Encounter (HOSPITAL_COMMUNITY)
Admission: EM | Disposition: A | Discharge status: Home / Self Care | Payer: Self-pay | Source: Home / Self Care | Attending: Internal Medicine

## 2019-12-08 ENCOUNTER — Encounter (HOSPITAL_COMMUNITY): Payer: Self-pay | Admitting: Cardiovascular Disease

## 2019-12-08 DIAGNOSIS — I5043 Acute on chronic combined systolic (congestive) and diastolic (congestive) heart failure: Secondary | ICD-10-CM | POA: Diagnosis not present

## 2019-12-08 DIAGNOSIS — I340 Nonrheumatic mitral (valve) insufficiency: Secondary | ICD-10-CM | POA: Diagnosis not present

## 2019-12-08 DIAGNOSIS — I420 Dilated cardiomyopathy: Secondary | ICD-10-CM | POA: Diagnosis not present

## 2019-12-08 DIAGNOSIS — J9601 Acute respiratory failure with hypoxia: Secondary | ICD-10-CM

## 2019-12-08 DIAGNOSIS — I5020 Unspecified systolic (congestive) heart failure: Secondary | ICD-10-CM

## 2019-12-08 HISTORY — PX: RIGHT/LEFT HEART CATH AND CORONARY ANGIOGRAPHY: CATH118266

## 2019-12-08 LAB — POCT I-STAT 7, (LYTES, BLD GAS, ICA,H+H)
Acid-base deficit: 2 mmol/L (ref 0.0–2.0)
Bicarbonate: 23 mmol/L (ref 20.0–28.0)
Calcium, Ion: 1.23 mmol/L (ref 1.15–1.40)
HCT: 39 % (ref 36.0–46.0)
Hemoglobin: 13.3 g/dL (ref 12.0–15.0)
O2 Saturation: 95 %
Potassium: 4.8 mmol/L (ref 3.5–5.1)
Sodium: 133 mmol/L — ABNORMAL LOW (ref 135–145)
TCO2: 24 mmol/L (ref 22–32)
pCO2 arterial: 38.7 mmHg (ref 32.0–48.0)
pH, Arterial: 7.383 (ref 7.350–7.450)
pO2, Arterial: 77 mmHg — ABNORMAL LOW (ref 83.0–108.0)

## 2019-12-08 LAB — CBC
HCT: 38.7 % (ref 36.0–46.0)
HCT: 42.1 % (ref 36.0–46.0)
Hemoglobin: 12.5 g/dL (ref 12.0–15.0)
Hemoglobin: 13.3 g/dL (ref 12.0–15.0)
MCH: 28.1 pg (ref 26.0–34.0)
MCH: 27.7 pg (ref 26.0–34.0)
MCHC: 32.3 g/dL (ref 30.0–36.0)
MCHC: 31.6 g/dL (ref 30.0–36.0)
MCV: 87 fL (ref 80.0–100.0)
MCV: 87.5 fL (ref 80.0–100.0)
Platelets: 214 10*3/uL (ref 150–400)
Platelets: 194 10*3/uL (ref 150–400)
RBC: 4.45 MIL/uL (ref 3.87–5.11)
RBC: 4.81 MIL/uL (ref 3.87–5.11)
RDW: 14.9 % (ref 11.5–15.5)
RDW: 15 % (ref 11.5–15.5)
WBC: 11.6 10*3/uL — ABNORMAL HIGH (ref 4.0–10.5)
WBC: 10.6 10*3/uL — ABNORMAL HIGH (ref 4.0–10.5)
nRBC: 0 % (ref 0.0–0.2)
nRBC: 0 % (ref 0.0–0.2)

## 2019-12-08 LAB — POCT I-STAT EG7
Acid-Base Excess: 2 mmol/L (ref 0.0–2.0)
Acid-Base Excess: 3 mmol/L — ABNORMAL HIGH (ref 0.0–2.0)
Bicarbonate: 28.5 mmol/L — ABNORMAL HIGH (ref 20.0–28.0)
Bicarbonate: 28.6 mmol/L — ABNORMAL HIGH (ref 20.0–28.0)
Calcium, Ion: 1.25 mmol/L (ref 1.15–1.40)
Calcium, Ion: 1.27 mmol/L (ref 1.15–1.40)
HCT: 40 % (ref 36.0–46.0)
HCT: 40 % (ref 36.0–46.0)
Hemoglobin: 13.6 g/dL (ref 12.0–15.0)
Hemoglobin: 13.6 g/dL (ref 12.0–15.0)
O2 Saturation: 41 %
O2 Saturation: 42 %
Potassium: 5 mmol/L (ref 3.5–5.1)
Potassium: 5 mmol/L (ref 3.5–5.1)
Sodium: 140 mmol/L (ref 135–145)
Sodium: 140 mmol/L (ref 135–145)
TCO2: 30 mmol/L (ref 22–32)
TCO2: 30 mmol/L (ref 22–32)
pCO2, Ven: 48.9 mmHg (ref 44.0–60.0)
pCO2, Ven: 49.1 mmHg (ref 44.0–60.0)
pH, Ven: 7.372 (ref 7.250–7.430)
pH, Ven: 7.375 (ref 7.250–7.430)
pO2, Ven: 24 mmHg — CL (ref 32.0–45.0)
pO2, Ven: 25 mmHg — CL (ref 32.0–45.0)

## 2019-12-08 LAB — BASIC METABOLIC PANEL
Anion gap: 13 (ref 5–15)
BUN: 28 mg/dL — ABNORMAL HIGH (ref 8–23)
CO2: 24 mmol/L (ref 22–32)
Calcium: 9.3 mg/dL (ref 8.9–10.3)
Chloride: 99 mmol/L (ref 98–111)
Creatinine, Ser: 1.01 mg/dL — ABNORMAL HIGH (ref 0.44–1.00)
GFR, Estimated: 60 mL/min — ABNORMAL LOW (ref >60)
Glucose, Bld: 186 mg/dL — ABNORMAL HIGH (ref 70–99)
Potassium: 4.7 mmol/L (ref 3.5–5.1)
Sodium: 136 mmol/L (ref 135–145)

## 2019-12-08 LAB — BRAIN NATRIURETIC PEPTIDE: B Natriuretic Peptide: 1693 pg/mL — ABNORMAL HIGH (ref 0.0–100.0)

## 2019-12-08 LAB — MAGNESIUM: Magnesium: 2.6 mg/dL — ABNORMAL HIGH (ref 1.7–2.4)

## 2019-12-08 SURGERY — RIGHT/LEFT HEART CATH AND CORONARY ANGIOGRAPHY
Anesthesia: LOCAL

## 2019-12-08 MED ORDER — SODIUM CHLORIDE 0.9 % IV SOLN: 250.0000 mL | INTRAVENOUS | Status: DC | PRN

## 2019-12-08 MED ORDER — MIDAZOLAM HCL 2 MG/2ML IJ SOLN
INTRAMUSCULAR | Status: AC
  Filled 2019-12-08: qty 2

## 2019-12-08 MED ORDER — ACETAMINOPHEN 325 MG PO TABS: 650.0000 mg | ORAL_TABLET | ORAL | Status: DC | PRN

## 2019-12-08 MED ORDER — LABETALOL HCL 5 MG/ML IV SOLN: 10.0000 mg | INTRAVENOUS | Status: AC | PRN

## 2019-12-08 MED ORDER — SODIUM CHLORIDE 0.9 % IV SOLN: INTRAVENOUS | Status: DC

## 2019-12-08 MED ORDER — MORPHINE SULFATE (PF) 2 MG/ML IV SOLN
1.0000 mg | Freq: Once | INTRAVENOUS | Status: AC
  Administered 2019-12-08: 1 mg via INTRAVENOUS
  Filled 2019-12-08: qty 1

## 2019-12-08 MED ORDER — HYDRALAZINE HCL 20 MG/ML IJ SOLN: 10.0000 mg | INTRAMUSCULAR | Status: AC | PRN

## 2019-12-08 MED ORDER — VERAPAMIL HCL 2.5 MG/ML IV SOLN
INTRAVENOUS | Status: AC
  Filled 2019-12-08: qty 2

## 2019-12-08 MED ORDER — SODIUM CHLORIDE 0.9% FLUSH: 3.0000 mL | INTRAVENOUS | Status: DC | PRN

## 2019-12-08 MED ORDER — HEPARIN (PORCINE) IN NACL 1000-0.9 UT/500ML-% IV SOLN
INTRAVENOUS | Status: AC
  Filled 2019-12-08: qty 500

## 2019-12-08 MED ORDER — HEPARIN SODIUM (PORCINE) 1000 UNIT/ML IJ SOLN
INTRAMUSCULAR | Status: AC
  Filled 2019-12-08: qty 1

## 2019-12-08 MED ORDER — HEPARIN (PORCINE) IN NACL 1000-0.9 UT/500ML-% IV SOLN
INTRAVENOUS | Status: DC | PRN
  Administered 2019-12-08 (×2): 500 mL

## 2019-12-08 MED ORDER — FENTANYL CITRATE (PF) 100 MCG/2ML IJ SOLN
INTRAMUSCULAR | Status: DC | PRN
  Administered 2019-12-08: 25 ug via INTRAVENOUS

## 2019-12-08 MED ORDER — MIDAZOLAM HCL 2 MG/2ML IJ SOLN
INTRAMUSCULAR | Status: DC | PRN
  Administered 2019-12-08: 1 mg via INTRAVENOUS

## 2019-12-08 MED ORDER — IOHEXOL 350 MG/ML SOLN
INTRAVENOUS | Status: DC | PRN
  Administered 2019-12-08: 45 mL

## 2019-12-08 MED ORDER — SODIUM CHLORIDE 0.9 % IV SOLN: INTRAVENOUS | Status: AC

## 2019-12-08 MED ORDER — SODIUM CHLORIDE 0.9% FLUSH
3.0000 mL | Freq: Two times a day (BID) | INTRAVENOUS | Status: DC
  Administered 2019-12-08 – 2019-12-09 (×3): 3 mL via INTRAVENOUS

## 2019-12-08 MED ORDER — FENTANYL CITRATE (PF) 100 MCG/2ML IJ SOLN
INTRAMUSCULAR | Status: AC
  Filled 2019-12-08: qty 2

## 2019-12-08 MED ORDER — DIAZEPAM 2 MG PO TABS
2.0000 mg | ORAL_TABLET | Freq: Four times a day (QID) | ORAL | Status: DC | PRN
  Administered 2019-12-09: 2 mg via ORAL
  Filled 2019-12-08: qty 1

## 2019-12-08 MED ORDER — LIDOCAINE HCL (PF) 1 % IJ SOLN
INTRAMUSCULAR | Status: AC
  Filled 2019-12-08: qty 30

## 2019-12-08 MED ORDER — FUROSEMIDE 10 MG/ML IJ SOLN
40.0000 mg | Freq: Two times a day (BID) | INTRAMUSCULAR | Status: DC
  Administered 2019-12-08 – 2019-12-09 (×3): 40 mg via INTRAVENOUS
  Filled 2019-12-08 (×3): qty 4

## 2019-12-08 MED ORDER — LIDOCAINE HCL (PF) 1 % IJ SOLN
INTRAMUSCULAR | Status: DC | PRN
  Administered 2019-12-08: 10 mL

## 2019-12-08 SURGICAL SUPPLY — 10 items
CATH INFINITI 5FR JL4 (CATHETERS) ×1 IMPLANT
CATH INFINITI 5FR MULTPACK ANG (CATHETERS) ×1 IMPLANT
CATH SWAN GANZ 7F STRAIGHT (CATHETERS) ×1 IMPLANT
KIT HEART LEFT (KITS) ×2 IMPLANT
PACK CARDIAC CATHETERIZATION (CUSTOM PROCEDURE TRAY) ×2 IMPLANT
SHEATH PINNACLE 5F 10CM (SHEATH) ×1 IMPLANT
SHEATH PINNACLE 7F 10CM (SHEATH) ×1 IMPLANT
TRANSDUCER W/STOPCOCK (MISCELLANEOUS) ×2 IMPLANT
WIRE EMERALD 3MM-J .025X260CM (WIRE) ×1 IMPLANT
WIRE EMERALD 3MM-J .035X150CM (WIRE) ×1 IMPLANT

## 2019-12-08 NOTE — Progress Notes (Signed)
Site area: rt groin arterial and venous sheaths Site Prior to Removal:  Level 0 Pressure Applied For: 20 minutes Manual:   yes Patient Status During Pull:  stable Post Pull Site:  Level 0 Post Pull Instructions Given:  yes Post Pull Pulses Present:  Rt dp 1+ palpable Dressing Applied:  Gauze and tegaderm Bedrest begins @ 1115 Comments:

## 2019-12-08 NOTE — Assessment & Plan Note (Signed)
-  Patient requiring 2 L oxygen.  Not typically on oxygen at home.  Etiology considered volume overload from new diagnosis of CHF -Continue diuresis -Continue weaning oxygen as able

## 2019-12-08 NOTE — Progress Notes (Addendum)
Progress Note  Patient Name: Emma Hahn Date of Encounter: 12/08/2019  Primary Cardiologist: [name], MD  Subjective   Still feels about the same - fatigued, easily short of breath. Able to lie back this morning without acute dyspnea. Eager to proceed. Does report some occasional chest pain overnight as well; not clear that she mentioned this to anyone.  Inpatient Medications    Scheduled Meds: . aspirin EC  81 mg Oral Daily  . atorvastatin  40 mg Oral Daily  . enoxaparin (LOVENOX) injection  40 mg Subcutaneous Q24H  . folic acid  1 mg Oral Daily  . metoprolol tartrate  12.5 mg Oral BID  . multivitamin with minerals  1 tablet Oral Daily  . sodium chloride flush  3 mL Intravenous Q12H  . sodium chloride flush  3 mL Intravenous Q12H  . thiamine  100 mg Oral Daily   Or  . thiamine  100 mg Intravenous Daily   Continuous Infusions: . sodium chloride    . sodium chloride 250 mL (12/08/19 0549)  . sodium chloride     PRN Meds: sodium chloride, sodium chloride, acetaminophen, LORazepam, sodium chloride flush, sodium chloride flush   Vital Signs    Vitals:   12/07/19 2314 12/07/19 2318 12/08/19 0523 12/08/19 0543  BP: 116/75 116/75 92/61   Pulse: 100 97 69   Resp:   17   Temp:   (!) 97.5 F (36.4 C)   TempSrc:   Oral   SpO2:  99% 100%   Weight:    67.4 kg  Height:        Intake/Output Summary (Last 24 hours) at 12/08/2019 0848 Last data filed at 12/08/2019 0600 Gross per 24 hour  Intake 481.54 ml  Output 800 ml  Net -318.46 ml   Last 3 Weights 12/08/2019 12/07/2019 12/06/2019  Weight (lbs) 148 lb 11.2 oz 147 lb 14.4 oz 148 lb 9.4 oz  Weight (kg) 67.45 kg 67.087 kg 67.4 kg     Telemetry    NSR with occasional PVCs - Personally Reviewed  Physical Exam   GEN: No acute distress.  HEENT: Normocephalic, atraumatic, sclera non-icteric. Neck: No JVD or bruits. Cardiac: RRR no murmurs, rubs, or gallops.  Radials/DP/PT 1+ and equal bilaterally.   Respiratory: Mild expiratory wheezing anteriorly, no rales or rhonchi, diffusely diminshed. Clear to auscultation bilaterally. Breathing is unlabored. GI: Soft, nontender, non-distended, BS +x 4. MS: no deformity. Extremities: No clubbing or cyanosis. No edema. Distal pedal pulses are 2+ and equal bilaterally. Neuro:  AAOx3. Follows commands. Psych:  Responds to questions appropriately with a normal affect.  Labs    High Sensitivity Troponin:   Recent Labs  Lab 12/06/19 1404 12/06/19 1551  TROPONINIHS 28* 32*      Cardiac EnzymesNo results for input(s): TROPONINI in the last 168 hours. No results for input(s): TROPIPOC in the last 168 hours.   Chemistry Recent Labs  Lab 12/06/19 1404 12/07/19 0433 12/08/19 0348  NA 137 137 136  K 3.7 3.5 4.7  CL 106 100 99  CO2 18* 22 24  GLUCOSE 149* 285* 186*  BUN 15 21 28*  CREATININE 0.81 1.07* 1.01*  CALCIUM 8.9 9.7 9.3  PROT 6.5  --   --   ALBUMIN 3.2*  --   --   AST 23  --   --   ALT 16  --   --   ALKPHOS 40  --   --   BILITOT 1.2  --   --  GFRNONAA >60 56* 60*  ANIONGAP 13 15 13      Hematology Recent Labs  Lab 12/06/19 1404 12/08/19 0348  WBC 7.1 11.6*  RBC 4.53 4.45  HGB 12.5 12.5  HCT 40.5 38.7  MCV 89.4 87.0  MCH 27.6 28.1  MCHC 30.9 32.3  RDW 15.0 14.9  PLT 186 214    BNP Recent Labs  Lab 12/06/19 1404 12/08/19 0349  BNP 1,862.8* 1,693.0*     DDimer No results for input(s): DDIMER in the last 168 hours.   Radiology    DG Chest Portable 1 View  Result Date: 12/06/2019 CLINICAL DATA:  Shortness of breath, chest pain EXAM: PORTABLE CHEST 1 VIEW COMPARISON:  None. FINDINGS: Cardiomegaly. Atherosclerotic calcification of the aortic knob. Pulmonary vascular congestion. Increased interstitial markings throughout both lungs with patchy bibasilar opacities. No appreciable pleural fluid collection. No pneumothorax. IMPRESSION: Cardiomegaly with pulmonary vascular congestion and interstitial edema. Patchy  bibasilar opacities may reflect alveolar edema versus infection. Electronically Signed   By: 12/08/2019 D.O.   On: 12/06/2019 14:03   ECHOCARDIOGRAM COMPLETE  Result Date: 12/06/2019    ECHOCARDIOGRAM REPORT   Patient Name:   [name] Date of Exam: 12/06/2019 Medical Rec #:  12/08/2019    Height: Accession #:    [id number]   Weight: Date of Birth:  May 17, 1950    BSA: Patient Age:    69 years     BP:           155/96 mmHg Patient Gender: F            HR:           102 bpm. Exam Location:  Inpatient Procedure: 2D Echo Indications:    congestive heart failure  History:        Patient has prior history of Echocardiogram examinations. COPD,                 Mitral Valve Disease; Signs/Symptoms:Dyspnea.  Sonographer:    04/25/1950 Referring Phys: Delcie Roch RONDELL A SMITH IMPRESSIONS  1. Left ventricular ejection fraction, by estimation, is 25 to 30%. The left ventricle has severely decreased function. The left ventricle demonstrates global hypokinesis. There is mild left ventricular hypertrophy. Left ventricular diastolic parameters  are consistent with Grade II diastolic dysfunction (pseudonormalization). Elevated left atrial pressure.  2. Right ventricular systolic function is normal. The right ventricular size is normal. There is moderately elevated pulmonary artery systolic pressure. The estimated right ventricular systolic pressure is 59.5 mmHg.  3. Left atrial size was moderately dilated.  4. Right atrial size was moderately dilated.  5. Moderate pleural effusion in the left lateral region.  6. The mitral valve is degenerative. Moderate to severe mitral valve regurgitation. No evidence of mitral stenosis.  7. Tricuspid valve regurgitation is severe.  8. The aortic valve is normal in structure. Aortic valve regurgitation is not visualized. No aortic stenosis is present.  9. The inferior vena cava is normal in size with greater than 50% respiratory variability, suggesting right atrial pressure of 3  mmHg. FINDINGS  Left Ventricle: Left ventricular ejection fraction, by estimation, is 25 to 30%. The left ventricle has severely decreased function. The left ventricle demonstrates global hypokinesis. The left ventricular internal cavity size was normal in size. There is mild left ventricular hypertrophy. Left ventricular diastolic parameters are consistent with Grade II diastolic dysfunction (pseudonormalization). Elevated left atrial pressure. Right Ventricle: The right ventricular size is normal. No increase in right ventricular wall thickness. Right ventricular systolic  function is normal. There is moderately elevated pulmonary artery systolic pressure. The tricuspid regurgitant velocity is 3.69 m/s, and with an assumed right atrial pressure of 5 mmHg, the estimated right ventricular systolic pressure is 59.5 mmHg. Left Atrium: Left atrial size was moderately dilated. Right Atrium: Right atrial size was moderately dilated. Pericardium: There is no evidence of pericardial effusion. Mitral Valve: The mitral valve is degenerative in appearance. There is moderate thickening of the mitral valve leaflet(s). Moderate to severe mitral valve regurgitation, with posteriorly-directed jet. No evidence of mitral valve stenosis. Tricuspid Valve: The tricuspid valve is normal in structure. Tricuspid valve regurgitation is severe. No evidence of tricuspid stenosis. Aortic Valve: The aortic valve is normal in structure. Aortic valve regurgitation is not visualized. No aortic stenosis is present. Pulmonic Valve: The pulmonic valve was normal in structure. Pulmonic valve regurgitation is mild. No evidence of pulmonic stenosis. Aorta: The aortic root is normal in size and structure. Venous: The inferior vena cava is normal in size with greater than 50% respiratory variability, suggesting right atrial pressure of 3 mmHg. IAS/Shunts: No atrial level shunt detected by color flow Doppler. Additional Comments: There is a moderate  pleural effusion in the left lateral region.  LEFT VENTRICLE PLAX 2D LVIDd:         5.10 cm     Diastology LVIDs:         4.40 cm     LV e' medial:    7.94 cm/s LV PW:         1.20 cm     LV E/e' medial:  15.9 LV IVS:        1.00 cm     LV e' lateral:   6.85 cm/s LVOT diam:     1.70 cm     LV E/e' lateral: 18.4 LV SV:         29 LVOT Area:     2.27 cm  LV Volumes (MOD) LV vol d, MOD A4C: 97.9 ml LV vol s, MOD A4C: 69.6 ml LV SV MOD A4C:     97.9 ml RIGHT VENTRICLE            IVC RV S prime:     8.38 cm/s  IVC diam: 2.00 cm TAPSE (M-mode): 1.1 cm LEFT ATRIUM             RIGHT ATRIUM LA diam:        4.90 cm RA Area:     17.90 cm LA Vol (A2C):   74.2 ml RA Volume:   47.60 ml LA Vol (A4C):   86.5 ml LA Biplane Vol: 86.3 ml  AORTIC VALVE LVOT Vmax:   93.30 cm/s LVOT Vmean:  57.600 cm/s LVOT VTI:    0.129 m  AORTA Ao Root diam: 3.00 cm Ao Asc diam:  3.10 cm MITRAL VALVE                 TRICUSPID VALVE MV Area (PHT): 6.32 cm      TR Peak grad:   54.5 mmHg MV Decel Time: 120 msec      TR Vmax:        369.00 cm/s MR Peak grad:    117.9 mmHg MR Mean grad:    73.0 mmHg   SHUNTS MR Vmax:         543.00 cm/s Systemic VTI:  0.13 m MR Vmean:        403.0 cm/s  Systemic Diam: 1.70 cm MR PISA:  1.01 cm MR PISA Eff ROA: 7 mm MR PISA Radius:  0.40 cm MV E velocity: 126.00 cm/s MV A velocity: 47.60 cm/s MV E/A ratio:  2.65 Donato Schultz MD Electronically signed by Donato Schultz MD Signature Date/Time: 12/06/2019/6:08:48 PM    Final     Cardiac Studies   2D echo 12/06/19   1. Left ventricular ejection fraction, by estimation, is 25 to 30%. The  left ventricle has severely decreased function. The left ventricle  demonstrates global hypokinesis. There is mild left ventricular  hypertrophy. Left ventricular diastolic parameters   are consistent with Grade II diastolic dysfunction (pseudonormalization).  Elevated left atrial pressure.   2. Right ventricular systolic function is normal. The right ventricular  size is  normal. There is moderately elevated pulmonary artery systolic  pressure. The estimated right ventricular systolic pressure is 59.5 mmHg.   3. Left atrial size was moderately dilated.   4. Right atrial size was moderately dilated.   5. Moderate pleural effusion in the left lateral region.   6. The mitral valve is degenerative. Moderate to severe mitral valve  regurgitation. No evidence of mitral stenosis.   7. Tricuspid valve regurgitation is severe.   8. The aortic valve is normal in structure. Aortic valve regurgitation is  not visualized. No aortic stenosis is present.   9. The inferior vena cava is normal in size with greater than 50%  respiratory variability, suggesting right atrial pressure of 3 mmHg.   Patient Profile     69 y.o. female with COPD, borderline DM, ongoing tobacco use, habitual alcohol use, and recent abnormal echo (details unclear, 06/2019). She presented to [facility] with worsening SOB and chest pain for several months, found to have new heart failure (EF 25-30%) and mitral/tricuspid regurgitation  Assessment & Plan    1. 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 She is agreeable and has no questions about the procedure today - hold off further diuresis - added trial of low dose metoprolol yesterday but BPs are soft and she has mild wheezing so will discontinue - BP too soft to add additional management at present time - watch BUN/Cr (slight increase in BUN s/p diuresis, Cr stable compared to yesterday)   2. Chest pain concerning for unstable angina - low/flat troponins, arguing against active ACS but symptoms  certainly suspicious for coronary disease especially given LV dysfunction - cath today - continue ASA, new statin, see above re: BB   3. Tobacco/alcohol use - ultimate goal of cessation advised -  CIWA per primary team   4. Hyperlipidemia - LDL 143 - started atorvastatin (normal baseline LFTs)   5. COPD - pre-admission was instructed to f/u with pulmonology per notes but did not wish to do so - likely further diminishing her reserve   6. Prolonged QT interval  - QTc on f/u EKG this AM, ? ischemia - lytes repleted yesterday, no need for additional repletion today - avoid additional QT prolonging agents   7. Unintentional weight loss - 13lb from baseline over last 1 year - TSH wnl - patient previously told to get colonoscopy and mammogram as OP but elected to defer, will need to reconsider after discharge  For questions or updates, please contact [facility] Please consult [email or web address] for contact info under Cardiology/STEMI.  Signed, Laurann Montana, PA-C 12/08/2019, 8:48 AM    Personally seen and examined. 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 Without evidence of arrhythmic cause; ischemia cause, or infilitrative sequalae; MR leading to HFrEF is on DDX - will diuresis today given SOB - will get medications for back pain - will check BP Hgb; back pain post R Fem access could lead to possible RP bleed; low threshold for CTA Discussed the  the risks and benefits of transesophageal echocardiogram with patient and niiece Konnie,  including risks of esophageal damage, perforation (1:10,000 risk), bleeding, pharyngeal hematoma as well as other potential complications associated with conscious sedation including aspiration, arrhythmia, respiratory failure and death. Alternatives to treatment were discussed, questions were answered. Patient is willing to proceed.   Planned for tomorrow; NPO at midnight  [name], MD  12/08/2019 12:43 PM

## 2019-12-08 NOTE — Progress Notes (Signed)
PT Cancellation Note  Patient Details Name: Emma Hahn MRN: [id number] DOB: [birth year]   Cancelled Treatment:    Reason Eval/Treat Not Completed: Active bedrest order - noted pt on bedrest starting at 11:15a for sheath removal. Will follow-up for PT Evaluation as schedule permits.  Ina Homes, PT, DPT Acute Rehabilitation Services  Pager [phone] Office [phone]  Malachy Chamber 12/08/2019, 1:00 PM

## 2019-12-08 NOTE — Progress Notes (Signed)
PROGRESS NOTE    Emma Hahn   WGN:[id number]  DOB: 08/28/1950  DOA: 12/06/2019     2  PCP: Jackie Plum, MD  CC: SOB, CP  Hospital Course: Emma Hahn is a 69 y.o. female with PMH prediabetes, COPD, ongoing tobacco use.  She has not seen primary care in several years.  She did however present to a physician earlier this year around June due to shortness of breath and chest pain.  At that time she was started on multiple medications which she cannot remember other than nitroglycerin.  She was to follow-up with cardiology however did not do so.  She also ran out of refills of her medications therefore did not think she needed to be on them any further. She continues to smoke.  She also endorses occasional alcohol use at home but states no more than 1 or 2 drinks at a time for approximately 3-4 nights per week. She endorses increased swelling in her abdomen lately.  She also has difficulty sleeping at night due to inability to lay flat. On work-up in the ER she was found to be hypoxic and placed on 2 L nasal cannula oxygen.  BNP was elevated.  CXR showed cardiomegaly and interstitial edema. She was admitted for further cardiac work-up and cardiology evaluation. She was evaluated by cardiology with recommendations for heart catheterization and underwent on 11/23. She had no significant coronary disease.  PWCP was elevated.  In setting of her neg cath, she was recommended to undergo TEE to further evaluate.   Interval History:  Seen in her room after cath today.  Mostly was wanting to sit up and eat.  Understands next step is for TEE tomorrow.  Denies any significant chest pain, still short of breath.  Old records reviewed in assessment of this patient  ROS: Constitutional: negative for chills and fevers, Respiratory: positive for dyspnea on exertion, Cardiovascular: positive for exertional chest pressure/discomfort, fatigue and paroxysmal nocturnal dyspnea and Gastrointestinal:  negative for abdominal pain  Assessment & Plan: * Acute on chronic combined systolic and diastolic CHF (congestive heart failure) (HCC) - EF 25-30%, Gr II DD. Etiology possibly underlying CAD; lower chance that etoh use contributing large role but did counsel her to monitor intake; tobacco use also counseled for cessation - s/p lasix on admission; now resumed after cath -Continue aspirin -Patient will undergo TEE on 12/09/2019 to further evaluate  Chest pain - troponin indeterminate; given her worsening SOB/CP at home this does suggest USA/crescendo angina - appreciate cardiology consult - cath surprisingly negative for ischemic heart disease - continue asa; statin added per cardiology - TEE planned for 11/24; further plan pending results of this   Prediabetes -A1c 5.8% -Continue diet control  Prolonged QT interval -QTC on admission 510 -Monitor for any QT prolonging agents -Continue telemetry  Tobacco abuse -Patient strongly encouraged on tobacco cessation   Antimicrobials: None  DVT prophylaxis: Lovenox Code Status: Full Family Communication: None present Disposition Plan: Status is: Inpatient  Remains inpatient appropriate because:Ongoing diagnostic testing needed not appropriate for outpatient work up, IV treatments appropriate due to intensity of illness or inability to take PO and Inpatient level of care appropriate due to severity of illness   Dispo: The patient is from: Home              Anticipated d/c is to: Home              Anticipated d/c date is: 2 days  Patient currently is not medically stable to d/c.   Objective: Blood pressure 121/72, pulse 73, temperature 97.7 F (36.5 C), temperature source Oral, resp. rate (!) 0, height 5\' 4"  (1.626 m), weight 67.4 kg, SpO2 97 %.  Examination: General appearance: alert, cooperative and no distress Head: Normocephalic, without obvious abnormality, atraumatic Eyes: EOMI Lungs: clear to auscultation  bilaterally Heart: regular rate and rhythm and S1, S2 normal Abdomen: Mildly distended, soft, nontender, bowel sounds present Extremities: No lower extremity edema Skin: mobility and turgor normal Neurologic: Grossly normal  Consultants:   Cardiology  Procedures:   None  Data Reviewed: I have personally reviewed following labs and imaging studies Results for orders placed or performed during the hospital encounter of 12/06/19 (from the past 24 hour(s))  Basic metabolic panel     Status: Abnormal   Collection Time: 12/08/19  3:48 AM  Result Value Ref Range   Sodium 136 135 - 145 mmol/L   Potassium 4.7 3.5 - 5.1 mmol/L   Chloride 99 98 - 111 mmol/L   CO2 24 22 - 32 mmol/L   Glucose, Bld 186 (H) 70 - 99 mg/dL   BUN 28 (H) 8 - 23 mg/dL   Creatinine, Ser 12/10/19 (H) 0.44 - 1.00 mg/dL   Calcium 9.3 8.9 - 0.86 mg/dL   GFR, Estimated 60 (L) >60 mL/min   Anion gap 13 5 - 15  CBC     Status: Abnormal   Collection Time: 12/08/19  3:48 AM  Result Value Ref Range   WBC 11.6 (H) 4.0 - 10.5 K/uL   RBC 4.45 3.87 - 5.11 MIL/uL   Hemoglobin 12.5 12.0 - 15.0 g/dL   HCT 12/10/19 36 - 46 %   MCV 87.0 80.0 - 100.0 fL   MCH 28.1 26.0 - 34.0 pg   MCHC 32.3 30.0 - 36.0 g/dL   RDW 95.0 93.2 - 67.1 %   Platelets 214 150 - 400 K/uL   nRBC 0.0 0.0 - 0.2 %  Magnesium     Status: Abnormal   Collection Time: 12/08/19  3:48 AM  Result Value Ref Range   Magnesium 2.6 (H) 1.7 - 2.4 mg/dL  Brain natriuretic peptide     Status: Abnormal   Collection Time: 12/08/19  3:49 AM  Result Value Ref Range   B Natriuretic Peptide 1,693.0 (H) 0.0 - 100.0 pg/mL  CBC     Status: Abnormal   Collection Time: 12/08/19  1:17 PM  Result Value Ref Range   WBC 10.6 (H) 4.0 - 10.5 K/uL   RBC 4.81 3.87 - 5.11 MIL/uL   Hemoglobin 13.3 12.0 - 15.0 g/dL   HCT 12/10/19 36 - 46 %   MCV 87.5 80.0 - 100.0 fL   MCH 27.7 26.0 - 34.0 pg   MCHC 31.6 30.0 - 36.0 g/dL   RDW 80.9 98.3 - 38.2 %   Platelets 194 150 - 400 K/uL   nRBC 0.0  0.0 - 0.2 %    Recent Results (from the past 240 hour(s))  Respiratory Panel by RT PCR (Flu A&B, Covid) - Nasopharyngeal Swab     Status: None   Collection Time: 12/06/19  2:00 PM   Specimen: Nasopharyngeal Swab; Nasopharyngeal(NP) swabs in vial transport medium  Result Value Ref Range Status   SARS Coronavirus 2 by RT PCR NEGATIVE NEGATIVE Final    Comment: (NOTE) SARS-CoV-2 target nucleic acids are NOT DETECTED.  The SARS-CoV-2 RNA is generally detectable in upper respiratoy specimens during the acute phase of  infection. The lowest concentration of SARS-CoV-2 viral copies this assay can detect is 131 copies/mL. A negative result does not preclude SARS-Cov-2 infection and should not be used as the sole basis for treatment or other patient management decisions. A negative result may occur with  improper specimen collection/handling, submission of specimen other than nasopharyngeal swab, presence of viral mutation(s) within the areas targeted by this assay, and inadequate number of viral copies (<131 copies/mL). A negative result must be combined with clinical observations, patient history, and epidemiological information. The expected result is Negative.  Fact Sheet for Patients:  https://www.moore.com/  Fact Sheet for Healthcare Providers:  https://www.young.biz/  This test is no t yet approved or cleared by the [place] FDA and  has been authorized for detection and/or diagnosis of SARS-CoV-2 by FDA under an Emergency Use Authorization (EUA). This EUA will remain  in effect (meaning this test can be used) for the duration of the COVID-19 declaration under Section 564(b)(1) of the Act, 21 U.S.C. section 360bbb-3(b)(1), unless the authorization is terminated or revoked sooner.     Influenza A by PCR NEGATIVE NEGATIVE Final   Influenza B by PCR NEGATIVE NEGATIVE Final    Comment: (NOTE) The Xpert Xpress SARS-CoV-2/FLU/RSV assay is  intended as an aid in  the diagnosis of influenza from Nasopharyngeal swab specimens and  should not be used as a sole basis for treatment. Nasal washings and  aspirates are unacceptable for Xpert Xpress SARS-CoV-2/FLU/RSV  testing.  Fact Sheet for Patients: https://www.moore.com/  Fact Sheet for Healthcare Providers: https://www.young.biz/  This test is not yet approved or cleared by the [place] FDA and  has been authorized for detection and/or diagnosis of SARS-CoV-2 by  FDA under an Emergency Use Authorization (EUA). This EUA will remain  in effect (meaning this test can be used) for the duration of the  Covid-19 declaration under Section 564(b)(1) of the Act, 21  U.S.C. section 360bbb-3(b)(1), unless the authorization is  terminated or revoked. Performed at [facility], [address] [place], [place] [address]      Radiology Studies: CARDIAC CATHETERIZATION  Result Date: 12/08/2019 Mild - moderate right heart pressure elevation with mild pulmonary hypertension (mean PA 32 mmHg). Normal coronary arteries. Echo documentation of EF at 25 to 30%. 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  Smoking cessation.  ECHOCARDIOGRAM COMPLETE  Result Date: 12/06/2019    ECHOCARDIOGRAM REPORT   Patient Name:   Emma Hahn Date of Exam: 12/06/2019 Medical Rec #:  [id number]    Height: Accession #:    [id number]   Weight: Date of Birth:  04/28/1950    BSA: Patient Age:    69 years     BP:           155/96 mmHg Patient Gender: F            HR:           102 bpm. Exam Location:  Inpatient Procedure: 2D Echo Indications:    congestive heart failure  History:        Patient has prior history of Echocardiogram examinations. COPD,                 Mitral Valve Disease; Signs/Symptoms:Dyspnea.  Sonographer:    Delcie Roch Referring  Phys: [id number] RONDELL A SMITH IMPRESSIONS  1. Left ventricular ejection fraction, by estimation, is 25 to 30%. The left ventricle has severely decreased function.  The left ventricle demonstrates global hypokinesis. There is mild left ventricular hypertrophy. Left ventricular diastolic parameters  are consistent with Grade II diastolic dysfunction (pseudonormalization). Elevated left atrial pressure.  2. Right ventricular systolic function is normal. The right ventricular size is normal. There is moderately elevated pulmonary artery systolic pressure. The estimated right ventricular systolic pressure is 59.5 mmHg.  3. Left atrial size was moderately dilated.  4. Right atrial size was moderately dilated.  5. Moderate pleural effusion in the left lateral region.  6. The mitral valve is degenerative. Moderate to severe mitral valve regurgitation. No evidence of mitral stenosis.  7. Tricuspid valve regurgitation is severe.  8. The aortic valve is normal in structure. Aortic valve regurgitation is not visualized. No aortic stenosis is present.  9. The inferior vena cava is normal in size with greater than 50% respiratory variability, suggesting right atrial pressure of 3 mmHg. FINDINGS  Left Ventricle: Left ventricular ejection fraction, by estimation, is 25 to 30%. The left ventricle has severely decreased function. The left ventricle demonstrates global hypokinesis. The left ventricular internal cavity size was normal in size. There is mild left ventricular hypertrophy. Left ventricular diastolic parameters are consistent with Grade II diastolic dysfunction (pseudonormalization). Elevated left atrial pressure. Right Ventricle: The right ventricular size is normal. No increase in right ventricular wall thickness. Right ventricular systolic function is normal. There is moderately elevated pulmonary artery systolic pressure. The tricuspid regurgitant velocity is 3.69 m/s, and with an assumed right atrial pressure of 5  mmHg, the estimated right ventricular systolic pressure is 59.5 mmHg. Left Atrium: Left atrial size was moderately dilated. Right Atrium: Right atrial size was moderately dilated. Pericardium: There is no evidence of pericardial effusion. Mitral Valve: The mitral valve is degenerative in appearance. There is moderate thickening of the mitral valve leaflet(s). Moderate to severe mitral valve regurgitation, with posteriorly-directed jet. No evidence of mitral valve stenosis. Tricuspid Valve: The tricuspid valve is normal in structure. Tricuspid valve regurgitation is severe. No evidence of tricuspid stenosis. Aortic Valve: The aortic valve is normal in structure. Aortic valve regurgitation is not visualized. No aortic stenosis is present. Pulmonic Valve: The pulmonic valve was normal in structure. Pulmonic valve regurgitation is mild. No evidence of pulmonic stenosis. Aorta: The aortic root is normal in size and structure. Venous: The inferior vena cava is normal in size with greater than 50% respiratory variability, suggesting right atrial pressure of 3 mmHg. IAS/Shunts: No atrial level shunt detected by color flow Doppler. Additional Comments: There is a moderate pleural effusion in the left lateral region.  LEFT VENTRICLE PLAX 2D LVIDd:         5.10 cm     Diastology LVIDs:         4.40 cm     LV e' medial:    7.94 cm/s LV PW:         1.20 cm     LV E/e' medial:  15.9 LV IVS:        1.00 cm     LV e' lateral:   6.85 cm/s LVOT diam:     1.70 cm     LV E/e' lateral: 18.4 LV SV:         29 LVOT Area:     2.27 cm  LV Volumes (MOD) LV vol d, MOD A4C: 97.9 ml LV vol s, MOD A4C: 69.6 ml LV SV MOD A4C:     97.9 ml RIGHT VENTRICLE  IVC RV S prime:     8.38 cm/s  IVC diam: 2.00 cm TAPSE (M-mode): 1.1 cm LEFT ATRIUM             RIGHT ATRIUM LA diam:        4.90 cm RA Area:     17.90 cm LA Vol (A2C):   74.2 ml RA Volume:   47.60 ml LA Vol (A4C):   86.5 ml LA Biplane Vol: 86.3 ml  AORTIC VALVE LVOT Vmax:   93.30  cm/s LVOT Vmean:  57.600 cm/s LVOT VTI:    0.129 m  AORTA Ao Root diam: 3.00 cm Ao Asc diam:  3.10 cm MITRAL VALVE                 TRICUSPID VALVE MV Area (PHT): 6.32 cm      TR Peak grad:   54.5 mmHg MV Decel Time: 120 msec      TR Vmax:        369.00 cm/s MR Peak grad:    117.9 mmHg MR Mean grad:    73.0 mmHg   SHUNTS MR Vmax:         543.00 cm/s Systemic VTI:  0.13 m MR Vmean:        403.0 cm/s  Systemic Diam: 1.70 cm MR PISA:         1.01 cm MR PISA Eff ROA: 7 mm MR PISA Radius:  0.40 cm MV E velocity: 126.00 cm/s MV A velocity: 47.60 cm/s MV E/A ratio:  2.65 Donato Schultz MD Electronically signed by Donato Schultz MD Signature Date/Time: 12/06/2019/6:08:48 PM    Final    DG Chest Portable 1 View  Final Result      Scheduled Meds: . aspirin EC  81 mg Oral Daily  . atorvastatin  40 mg Oral Daily  . enoxaparin (LOVENOX) injection  40 mg Subcutaneous Q24H  . folic acid  1 mg Oral Daily  . furosemide  40 mg Intravenous BID  .  morphine injection  1 mg Intravenous Once  . multivitamin with minerals  1 tablet Oral Daily  . sodium chloride flush  3 mL Intravenous Q12H  . sodium chloride flush  3 mL Intravenous Q12H  . sodium chloride flush  3 mL Intravenous Q12H  . thiamine  100 mg Oral Daily   Or  . thiamine  100 mg Intravenous Daily   PRN Meds: sodium chloride, sodium chloride, acetaminophen, acetaminophen, diazepam, hydrALAZINE, labetalol, LORazepam, sodium chloride flush, sodium chloride flush Continuous Infusions: . sodium chloride    . sodium chloride    . sodium chloride       LOS: 2 days  Time spent: Greater than 50% of the 35 minute visit was spent in counseling/coordination of care for the patient as laid out in the A&P.   Lewie Chamber, MD [facility] 12/08/2019, 3:38 PM

## 2019-12-08 NOTE — Interval H&P Note (Signed)
Cath Lab Visit (complete for each Cath Lab visit)  Clinical Evaluation Leading to the Procedure:   ACS: No.  Non-ACS:    Anginal Classification: CCS II  Anti-ischemic medical therapy: Minimal Therapy (1 class of medications)  Non-Invasive Test Results: No non-invasive testing performed  Prior CABG: No previous CABG      History and Physical Interval Note:  12/08/2019 9:53 AM  Emma Hahn  has presented today for surgery, with the diagnosis of heart failure - mr - chest pain.  The various methods of treatment have been discussed with the patient and family. After consideration of risks, benefits and other options for treatment, the patient has consented to  Procedure(s): RIGHT/LEFT HEART CATH AND CORONARY ANGIOGRAPHY (N/A) as a surgical intervention.  The patient's history has been reviewed, patient examined, no change in status, stable for surgery.  I have reviewed the patient's chart and labs.  Questions were answered to the patient's satisfaction.     Nicki Guadalajara

## 2019-12-08 NOTE — Progress Notes (Signed)
  Mobility Specialist Criteria Algorithm Info.  Mobility Team: HOB elevated: Activity: Ambulated in hall;Dangled on edge of bed Range of motion: Active;All extremities Level of assistance: Standby assist, set-up cues, supervision of patient - no hands on Assistive device: None;Other (Comment) (IV Pole) Minutes sitting in chair:  Minutes stood: 5 minutes Minutes ambulated: 5 minutes Distance ambulated (ft): 120 ft Mobility response: Tolerated well Bed Position: Semi-fowlers  Pt presented to mobility while ambulating in hall independently with IV pole. Eager and anxious for procedure explaining she's ready to be discharged. Pt 3/4 dyspneic with exertion and unable to complete sentences without gasping for air, requiring standing rest break x2. At one point she had to stop and put her head down on the nurse station desk to catch her breath. Unable to obtain SPO2 reading. Ambulated back to room without incident but is still SOB. Pt is now dangling at the EOB with all needs met and call bell in reach.   12/08/2019 8:23 AM

## 2019-12-09 ENCOUNTER — Inpatient Hospital Stay (HOSPITAL_COMMUNITY): Payer: Medicare HMO | Admitting: Certified Registered Nurse Anesthetist

## 2019-12-09 ENCOUNTER — Encounter (HOSPITAL_COMMUNITY): Payer: Self-pay | Admitting: Internal Medicine

## 2019-12-09 ENCOUNTER — Inpatient Hospital Stay (HOSPITAL_COMMUNITY): Payer: Medicare HMO

## 2019-12-09 ENCOUNTER — Encounter (HOSPITAL_COMMUNITY)
Admission: EM | Disposition: A | Discharge status: Home / Self Care | Payer: Self-pay | Source: Home / Self Care | Attending: Internal Medicine

## 2019-12-09 DIAGNOSIS — I340 Nonrheumatic mitral (valve) insufficiency: Secondary | ICD-10-CM

## 2019-12-09 DIAGNOSIS — F1090 Alcohol use, unspecified, uncomplicated: Secondary | ICD-10-CM

## 2019-12-09 DIAGNOSIS — I5020 Unspecified systolic (congestive) heart failure: Secondary | ICD-10-CM | POA: Diagnosis not present

## 2019-12-09 DIAGNOSIS — I493 Ventricular premature depolarization: Secondary | ICD-10-CM

## 2019-12-09 DIAGNOSIS — J9601 Acute respiratory failure with hypoxia: Secondary | ICD-10-CM

## 2019-12-09 DIAGNOSIS — Z7289 Other problems related to lifestyle: Secondary | ICD-10-CM

## 2019-12-09 DIAGNOSIS — I361 Nonrheumatic tricuspid (valve) insufficiency: Secondary | ICD-10-CM

## 2019-12-09 DIAGNOSIS — I5043 Acute on chronic combined systolic (congestive) and diastolic (congestive) heart failure: Secondary | ICD-10-CM | POA: Diagnosis not present

## 2019-12-09 DIAGNOSIS — Z789 Other specified health status: Secondary | ICD-10-CM

## 2019-12-09 HISTORY — PX: TEE WITHOUT CARDIOVERSION: SHX5443

## 2019-12-09 LAB — BASIC METABOLIC PANEL
Anion gap: 10 (ref 5–15)
BUN: 23 mg/dL (ref 8–23)
CO2: 24 mmol/L (ref 22–32)
Calcium: 9.2 mg/dL (ref 8.9–10.3)
Chloride: 102 mmol/L (ref 98–111)
Creatinine, Ser: 0.9 mg/dL (ref 0.44–1.00)
GFR, Estimated: >60 mL/min (ref >60)
Glucose, Bld: 133 mg/dL — ABNORMAL HIGH (ref 70–99)
Potassium: 4.4 mmol/L (ref 3.5–5.1)
Sodium: 136 mmol/L (ref 135–145)

## 2019-12-09 LAB — ECHO TEE
MV M vel: 6.27 m/s
MV Peak grad: 157 mmHg
Radius: 0.7 cm

## 2019-12-09 LAB — CBC WITH DIFFERENTIAL/PLATELET
Abs Immature Granulocytes: 0.03 10*3/uL (ref 0.00–0.07)
Basophils Absolute: 0 10*3/uL (ref 0.0–0.1)
Basophils Relative: 0 %
Eosinophils Absolute: 0.2 10*3/uL (ref 0.0–0.5)
Eosinophils Relative: 2 %
HCT: 38.1 % (ref 36.0–46.0)
Hemoglobin: 11.9 g/dL — ABNORMAL LOW (ref 12.0–15.0)
Immature Granulocytes: 0 %
Lymphocytes Relative: 45 %
Lymphs Abs: 4.2 10*3/uL — ABNORMAL HIGH (ref 0.7–4.0)
MCH: 27.1 pg (ref 26.0–34.0)
MCHC: 31.2 g/dL (ref 30.0–36.0)
MCV: 86.8 fL (ref 80.0–100.0)
Monocytes Absolute: 0.8 10*3/uL (ref 0.1–1.0)
Monocytes Relative: 9 %
Neutro Abs: 4.2 10*3/uL (ref 1.7–7.7)
Neutrophils Relative %: 44 %
Platelets: 193 10*3/uL (ref 150–400)
RBC: 4.39 MIL/uL (ref 3.87–5.11)
RDW: 14.7 % (ref 11.5–15.5)
WBC: 9.5 10*3/uL (ref 4.0–10.5)
nRBC: 0 % (ref 0.0–0.2)

## 2019-12-09 LAB — MAGNESIUM: Magnesium: 2.1 mg/dL (ref 1.7–2.4)

## 2019-12-09 SURGERY — ECHOCARDIOGRAM, TRANSESOPHAGEAL
Anesthesia: Monitor Anesthesia Care

## 2019-12-09 MED ORDER — BUTAMBEN-TETRACAINE-BENZOCAINE 2-2-14 % EX AERO
INHALATION_SPRAY | CUTANEOUS | Status: DC | PRN
  Administered 2019-12-09: 2 via TOPICAL

## 2019-12-09 MED ORDER — LIDOCAINE 2% (20 MG/ML) 5 ML SYRINGE
INTRAMUSCULAR | Status: DC | PRN
  Administered 2019-12-09: 100 mg via INTRAVENOUS

## 2019-12-09 MED ORDER — PROPOFOL 500 MG/50ML IV EMUL
INTRAVENOUS | Status: DC | PRN
  Administered 2019-12-09: 100 ug/kg/min via INTRAVENOUS

## 2019-12-09 MED ORDER — METOPROLOL TARTRATE 12.5 MG HALF TABLET
12.5000 mg | ORAL_TABLET | Freq: Two times a day (BID) | ORAL | Status: DC
  Administered 2019-12-09 (×2): 12.5 mg via ORAL
  Filled 2019-12-09 (×2): qty 1

## 2019-12-09 MED ORDER — PROPOFOL 10 MG/ML IV BOLUS
INTRAVENOUS | Status: DC | PRN
  Administered 2019-12-09: 40 mg via INTRAVENOUS

## 2019-12-09 NOTE — Transfer of Care (Signed)
Immediate Anesthesia Transfer of Care Note  Patient: Emma Hahn  Procedure(s) Performed: TRANSESOPHAGEAL ECHOCARDIOGRAM (TEE) (N/A )  Patient Location: Endoscopy Unit  Anesthesia Type:MAC  Level of Consciousness: drowsy  Airway & Oxygen Therapy: Patient Spontanous Breathing and Patient connected to nasal cannula oxygen  Post-op Assessment: Report given to RN and Post -op Vital signs reviewed and stable  Post vital signs: Reviewed and stable  Last Vitals:  Vitals Value Taken Time  BP 119/73 12/09/19 1004  Temp    Pulse 88 12/09/19 1006  Resp 22 12/09/19 1006  SpO2 96 % 12/09/19 1006  Vitals shown include unvalidated device data.  Last Pain:  Vitals:   12/09/19 1004  TempSrc: Oral  PainSc: Asleep      Patients Stated Pain Goal: 1 (64/15/83 0940)  Complications: No complications documented.

## 2019-12-09 NOTE — Progress Notes (Signed)
ReDS Clip Diuretic Study Pt study # [id number]  Your patient is in the Blinded arm of the ReDS Clip Diuretic study.  Your patient has had a ReDS reading and the reading has been transmitted to the cloud.   Thank You   The research team   Lewis Grivas Boothby, PharmD, BCPS Heart Failure Stewardship Pharmacist Phone [phone]  Please check [email or web address] for unit-specific pharmacist phone numbers  

## 2019-12-09 NOTE — Progress Notes (Signed)
PROGRESS NOTE    Emma Hahn   WFU:[id number]  DOB: 08-08-1950  DOA: 12/06/2019     3  PCP: Jackie Plum, MD  CC: SOB, CP  Hospital Course: Emma Hahn is a 69 y.o. female with PMH prediabetes, COPD, ongoing tobacco use.  She has not seen primary care in several years.  She did however present to a physician earlier this year around June due to shortness of breath and chest pain.  At that time she was started on multiple medications which she cannot remember other than nitroglycerin.  She was to follow-up with cardiology however did not do so.  She also ran out of refills of her medications therefore did not think she needed to be on them any further. She continues to smoke.  She also endorses occasional alcohol use at home but states no more than 1 or 2 drinks at a time for approximately 3-4 nights per week. She endorses increased swelling in her abdomen lately.  She also has difficulty sleeping at night due to inability to lay flat. On work-up in the ER she was found to be hypoxic and placed on 2 L nasal cannula oxygen.  BNP was elevated.  CXR showed cardiomegaly and interstitial edema. She was admitted for further cardiac work-up and cardiology evaluation. She was evaluated by cardiology with recommendations for heart catheterization and underwent on 11/23. She had no significant coronary disease.  PWCP was elevated.  In setting of her neg cath, she was recommended to undergo TEE to further evaluate. TEE on 11/24 showed severe MR with recommendations for evaluation from the structural heart team.   Interval History:  Seen in room after TEE today.  She was found to have severe MR and will follow up outpatient for further evaluation and discussion of repair for this. She definitely is anxious about going home.  She does not appear to be in overt withdrawal from alcohol; she also declined a nicotine patch from me as well. She is stable on her feet and walked to the bathroom and back  while I was in the room.  Old records reviewed in assessment of this patient  ROS: Constitutional: negative for chills and fevers, Respiratory: positive for dyspnea on exertion, Cardiovascular: positive for exertional chest pressure/discomfort, fatigue and paroxysmal nocturnal dyspnea and Gastrointestinal: negative for abdominal pain  Assessment & Plan: * Acute on chronic combined systolic and diastolic CHF (congestive heart failure) (HCC) - EF 25-30%, Gr II DD. Etiology possibly underlying CAD; lower chance that etoh use contributing large role but did counsel her to monitor intake; tobacco use also counseled for cessation - s/p lasix on admission; now resumed after cath per cardiology  -Continue aspirin -Patient will undergo TEE on 12/09/2019 to further evaluate: Severe MR found, patient referred to structural heart team for outpatient repair  Chest pain - troponin indeterminate; given her worsening SOB/CP at home this does suggest USA/crescendo angina - appreciate cardiology consult - cath surprisingly negative for ischemic heart disease - continue asa; statin added per cardiology - TEE  Revealed severe MR; patient being referred for further outpt followup for repair   Acute respiratory failure with hypoxia (HCC) -Patient requiring 2 L oxygen.  Not typically on oxygen at home.  Etiology considered volume overload from new diagnosis of CHF -Continue diuresis -Continue weaning oxygen as able  Alcohol use -Patient endorses moderate use of alcohol outpatient.  She was counseled on admission to try cutting back on this -No obvious signs of withdrawal at this time,  if starts to exhibit then will initiate CIWA protocol  Prediabetes -A1c 5.8% -Continue diet control  Prolonged QT interval -QTC on admission 510 -Monitor for any QT prolonging agents -Continue telemetry  Tobacco abuse -Patient strongly encouraged on tobacco cessation; states that she is down to approximately 4  cigarettes/day - patient refusing nicotine patch; currently has a straw cut in half that she is pretending is a cigarette which seems to be satisfying the urge   Antimicrobials: None  DVT prophylaxis: Lovenox Code Status: Full Family Communication: None present Disposition Plan: Status is: Inpatient  Remains inpatient appropriate because:IV treatments appropriate due to intensity of illness or inability to take PO and Inpatient level of care appropriate due to severity of illness   Dispo: The patient is from: Home              Anticipated d/c is to: Home              Anticipated d/c date is: 1 day              Patient currently is not medically stable to d/c.   Objective: Blood pressure (!) 133/97, pulse 90, temperature 98.2 F (36.8 C), temperature source Temporal, resp. rate 18, height 5\' 4"  (1.626 m), weight 66.1 kg, SpO2 100 %.  Examination: General appearance: alert, cooperative and no distress Head: Normocephalic, without obvious abnormality, atraumatic Eyes: EOMI Lungs: clear to auscultation bilaterally Heart: regular rate and rhythm and S1, S2 normal Abdomen: Mildly distended, soft, nontender, bowel sounds present Extremities: No lower extremity edema Skin: mobility and turgor normal Neurologic: Grossly normal  Consultants:   Cardiology  Procedures:   None  Data Reviewed: I have personally reviewed following labs and imaging studies Results for orders placed or performed during the hospital encounter of 12/06/19 (from the past 24 hour(s))  Basic metabolic panel     Status: Abnormal   Collection Time: 12/09/19  3:24 AM  Result Value Ref Range   Sodium 136 135 - 145 mmol/L   Potassium 4.4 3.5 - 5.1 mmol/L   Chloride 102 98 - 111 mmol/L   CO2 24 22 - 32 mmol/L   Glucose, Bld 133 (H) 70 - 99 mg/dL   BUN 23 8 - 23 mg/dL   Creatinine, Ser 12/11/19 0.44 - 1.00 mg/dL   Calcium 9.2 8.9 - 5.32 mg/dL   GFR, Estimated 99.2 >42 mL/min   Anion gap 10 5 - 15  Magnesium      Status: None   Collection Time: 12/09/19  3:24 AM  Result Value Ref Range   Magnesium 2.1 1.7 - 2.4 mg/dL  CBC with Differential/Platelet     Status: Abnormal   Collection Time: 12/09/19  3:24 AM  Result Value Ref Range   WBC 9.5 4.0 - 10.5 K/uL   RBC 4.39 3.87 - 5.11 MIL/uL   Hemoglobin 11.9 (L) 12.0 - 15.0 g/dL   HCT 12/11/19 36 - 46 %   MCV 86.8 80.0 - 100.0 fL   MCH 27.1 26.0 - 34.0 pg   MCHC 31.2 30.0 - 36.0 g/dL   RDW 34.1 96.2 - 22.9 %   Platelets 193 150 - 400 K/uL   nRBC 0.0 0.0 - 0.2 %   Neutrophils Relative % 44 %   Neutro Abs 4.2 1.7 - 7.7 K/uL   Lymphocytes Relative 45 %   Lymphs Abs 4.2 (H) 0.7 - 4.0 K/uL   Monocytes Relative 9 %   Monocytes Absolute 0.8 0.1 - 1.0 K/uL  Eosinophils Relative 2 %   Eosinophils Absolute 0.2 0.0 - 0.5 K/uL   Basophils Relative 0 %   Basophils Absolute 0.0 0.0 - 0.1 K/uL   Immature Granulocytes 0 %   Abs Immature Granulocytes 0.03 0.00 - 0.07 K/uL    Recent Results (from the past 240 hour(s))  Respiratory Panel by RT PCR (Flu A&B, Covid) - Nasopharyngeal Swab     Status: None   Collection Time: 12/06/19  2:00 PM   Specimen: Nasopharyngeal Swab; Nasopharyngeal(NP) swabs in vial transport medium  Result Value Ref Range Status   SARS Coronavirus 2 by RT PCR NEGATIVE NEGATIVE Final    Comment: (NOTE) SARS-CoV-2 target nucleic acids are NOT DETECTED.  The SARS-CoV-2 RNA is generally detectable in upper respiratoy specimens during the acute phase of infection. The lowest concentration of SARS-CoV-2 viral copies this assay can detect is 131 copies/mL. A negative result does not preclude SARS-Cov-2 infection and should not be used as the sole basis for treatment or other patient management decisions. A negative result may occur with  improper specimen collection/handling, submission of specimen other than nasopharyngeal swab, presence of viral mutation(s) within the areas targeted by this assay, and inadequate number of viral  copies (<131 copies/mL). A negative result must be combined with clinical observations, patient history, and epidemiological information. The expected result is Negative.  Fact Sheet for Patients:  https://www.moore.com/  Fact Sheet for Healthcare Providers:  https://www.young.biz/  This test is no t yet approved or cleared by the [place] FDA and  has been authorized for detection and/or diagnosis of SARS-CoV-2 by FDA under an Emergency Use Authorization (EUA). This EUA will remain  in effect (meaning this test can be used) for the duration of the COVID-19 declaration under Section 564(b)(1) of the Act, 21 U.S.C. section 360bbb-3(b)(1), unless the authorization is terminated or revoked sooner.     Influenza A by PCR NEGATIVE NEGATIVE Final   Influenza B by PCR NEGATIVE NEGATIVE Final    Comment: (NOTE) The Xpert Xpress SARS-CoV-2/FLU/RSV assay is intended as an aid in  the diagnosis of influenza from Nasopharyngeal swab specimens and  should not be used as a sole basis for treatment. Nasal washings and  aspirates are unacceptable for Xpert Xpress SARS-CoV-2/FLU/RSV  testing.  Fact Sheet for Patients: https://www.moore.com/  Fact Sheet for Healthcare Providers: https://www.young.biz/  This test is not yet approved or cleared by the [place] FDA and  has been authorized for detection and/or diagnosis of SARS-CoV-2 by  FDA under an Emergency Use Authorization (EUA). This EUA will remain  in effect (meaning this test can be used) for the duration of the  Covid-19 declaration under Section 564(b)(1) of the Act, 21  U.S.C. section 360bbb-3(b)(1), unless the authorization is  terminated or revoked. Performed at [facility], [address] [place], [place] [address]      Radiology Studies: CARDIAC CATHETERIZATION  Result Date: 12/08/2019 Mild - moderate right heart pressure  elevation with mild pulmonary hypertension (mean PA 32 mmHg). Normal coronary arteries. Echo documentation of EF at 25 to 30%. 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  Smoking cessation.  DG Chest Portable 1 View  Final Result      Scheduled Meds: . aspirin EC  81 mg Oral Daily  . atorvastatin  40 mg Oral Daily  . enoxaparin (LOVENOX) injection  40 mg Subcutaneous Q24H  . folic acid  1 mg Oral  Daily  . furosemide  40 mg Intravenous BID  . metoprolol tartrate  12.5 mg Oral BID  . multivitamin with minerals  1 tablet Oral Daily  . sodium chloride flush  3 mL Intravenous Q12H  . sodium chloride flush  3 mL Intravenous Q12H  . sodium chloride flush  3 mL Intravenous Q12H  . thiamine  100 mg Oral Daily   Or  . thiamine  100 mg Intravenous Daily   PRN Meds: sodium chloride, sodium chloride, acetaminophen, diazepam, sodium chloride flush, sodium chloride flush Continuous Infusions: . sodium chloride    . sodium chloride       LOS: 3 days  Time spent: Greater than 50% of the 35 minute visit was spent in counseling/coordination of care for the patient as laid out in the A&P.   Lewie Chamber, MD [facility] 12/09/2019, 2:59 PM

## 2019-12-09 NOTE — Evaluation (Signed)
Physical Therapy Evaluation Patient Details Name: Emma Hahn MRN: [id number] DOB: Sep 27, 1950 Today's Date: 12/09/2019   History of Present Illness  Pt is a 69 y.o. F with significant PMH of COPD, borderline DM, ongoing tobacco abuse, habitual alcohol use who presents for evaluation of new heart failure and mitral/triscuspid regurgitation.   Clinical Impression  Prior to admission, pt lives alone and is independent. Pt seems to be fairly close to her functional baseline; displays mildly decreased cardiovascular endurance. Pt ambulating 200 feet with no assistive device and negotiated 6 steps with single railing without physical difficulty. HR stable in 90's, Spo2 96% on RA. See below for recommendations. No further acute or follow up PT needs. Thank you for this consult.    Follow Up Recommendations No PT follow up    Equipment Recommendations  3in1 (PT)    Recommendations for Other Services       Precautions / Restrictions Precautions Precautions: None Restrictions Weight Bearing Restrictions: No      Mobility  Bed Mobility Overal bed mobility: Independent                  Transfers Overall transfer level: Independent Equipment used: None                Ambulation/Gait Ambulation/Gait assistance: Independent Gait Distance (Feet): 200 Feet Assistive device: None Gait Pattern/deviations: WFL(Within Functional Limits)     General Gait Details: No gait abnormalities or gross instability noted  Stairs Stairs: Yes Stairs assistance: Supervision Stair Management: One rail Right;One rail Left Number of Stairs: 6 General stair comments: Step over step pattern  Wheelchair Mobility    Modified Rankin (Stroke Patients Only)       Balance Overall balance assessment: No apparent balance deficits (not formally assessed)                                           Pertinent Vitals/Pain Pain Assessment: No/denies pain    Home Living  Family/patient expects to be discharged to:: Private residence Living Arrangements: Alone Available Help at Discharge: Family;Available PRN/intermittently Type of Home: House Home Access: Stairs to enter   Entrance Stairs-Number of Steps: 3 Home Layout: Two level Home Equipment: None      Prior Function Level of Independence: Independent         Comments: Drives, community ambulatory     Hand Dominance        Extremity/Trunk Assessment   Upper Extremity Assessment Upper Extremity Assessment: Overall WFL for tasks assessed    Lower Extremity Assessment Lower Extremity Assessment: Overall WFL for tasks assessed    Cervical / Trunk Assessment Cervical / Trunk Assessment: Normal  Communication   Communication: No difficulties  Cognition Arousal/Alertness: Awake/alert Behavior During Therapy: WFL for tasks assessed/performed Overall Cognitive Status: Within Functional Limits for tasks assessed                                        General Comments      Exercises     Assessment/Plan    PT Assessment Patent does not need any further PT services  PT Problem List         PT Treatment Interventions      PT Goals (Current goals can be found in the Care Plan section)  Acute  Rehab PT Goals Patient Stated Goal: continue to remain independent PT Goal Formulation: All assessment and education complete, DC therapy    Frequency     Barriers to discharge        Co-evaluation               AM-PAC PT "6 Clicks" Mobility  Outcome Measure Help needed turning from your back to your side while in a flat bed without using bedrails?: None Help needed moving from lying on your back to sitting on the side of a flat bed without using bedrails?: None Help needed moving to and from a bed to a chair (including a wheelchair)?: None Help needed standing up from a chair using your arms (e.g., wheelchair or bedside chair)?: None Help needed to walk in  hospital room?: None Help needed climbing 3-5 steps with a railing? : None 6 Click Score: 24    End of Session Equipment Utilized During Treatment: Gait belt Activity Tolerance: Patient tolerated treatment well Patient left: in bed;with call bell/phone within reach Nurse Communication: Mobility status PT Visit Diagnosis: Difficulty in walking, not elsewhere classified (R26.2)    Time: 1316-1340 PT Time Calculation (min) (ACUTE ONLY): 24 min   Charges:   PT Evaluation $PT Eval Moderate Complexity: 1 Mod PT Treatments $Therapeutic Activity: 8-22 mins        Lillia Pauls, PT, DPT Acute Rehabilitation Services Pager [phone] Office [phone]   Norval Morton 12/09/2019, 2:33 PM

## 2019-12-09 NOTE — Plan of Care (Signed)
  Problem: Education: Goal: Knowledge of General Education information will improve Description Including pain rating scale, medication(s)/side effects and non-pharmacologic comfort measures Outcome: Progressing   

## 2019-12-09 NOTE — CV Procedure (Signed)
TRANSESOPHAGEAL ECHOCARDIOGRAM (TEE) NOTE  INDICATIONS: mitral regurgitation  PROCEDURE:   Informed consent was obtained prior to the procedure. The risks, benefits and alternatives for the procedure were discussed and the patient comprehended these risks.  Risks include, but are not limited to, cough, sore throat, vomiting, nausea, somnolence, esophageal and stomach trauma or perforation, bleeding, low blood pressure, aspiration, pneumonia, infection, trauma to the teeth and death.    After a procedural time-out, the patient was given propofol per anesthesia for sedation.  The patient's heart rate, blood pressure, and oxygen saturation are monitored continuously during the procedure.The oropharynx was anesthetized with topical cetacaine spray.  The transesophageal probe was inserted in the esophagus and stomach without difficulty and multiple views were obtained.  The patient was kept under observation until the patient left the procedure room.  I was present face-to-face 100% of this time. The patient left the procedure room in stable condition.   Agitated microbubble saline contrast was not administered.  COMPLICATIONS:    There were no immediate complications.  Findings:  1. LEFT VENTRICLE: The left ventricular wall thickness is mildly increased.  The left ventricular cavity is normal in size. Wall motion is globally hypokinetic.  LVEF is 25-30%.  2. RIGHT VENTRICLE:  The right ventricle is normal in structure and function without any thrombus or masses.    3. LEFT ATRIUM:  The left atrium is moderately dilated in size without any thrombus or masses.  There is spontaneous echo contrast ("smoke") in the left atrium consistent with a low flow state.  4. LEFT ATRIAL APPENDAGE:  The left atrial appendage is very large and windsock shape free of any thrombus or masses. The appendage has single lobes. Pulse doppler indicates moderate flow in the appendage.  5. ATRIAL SEPTUM:  The atrial  septum appears intact and is free of thrombus and/or masses.  There is no evidence for interatrial shunting by color doppler and saline microbubble.  6. RIGHT ATRIUM:  The right atrium is moderately dilated in size and function without any thrombus or masses.  7. MITRAL VALVE:  The mitral valve is abnormal in structure, demonstrating myxomatous thickening and degenerate changes with immobility of the basal portions of both leaflets and thickening of the mid to distal portions. There is thickening the subvalvular apparatus and some small mobile material on the LV side of the posterior leaflet, likely a partially flail cord. There are 2 major eccentric regurgitant jets that converge centrally and are directed more posteriorly, whose velocities were measured in an additive fashion. Overall, there is Severe regurgitation (see formal report for PISA/Rvol details) - Carpentier Type IIIA. The annulus measures 3.0 cm and therefore is not dilated.  There were no vegetations or stenosis.  8. AORTIC VALVE:  The aortic valve is trileaflet, normal in structure and function with no regurgitation.  There were no vegetations or stenosis  9. TRICUSPID VALVE:  The tricuspid valve is abnormal in structure with leaflet thickening and Moderate regurgitation. RVSP is 32 mmHg + RAP. There were no vegetations or stenosis  10.  PULMONIC VALVE:  The pulmonic valve appears normal structure and function with Mild regurgitation.  There were no vegetations or stenosis.   11. AORTIC ARCH, ASCENDING AND DESCENDING AORTA:  There was grade 2 Myrtis Ser et. Al, 1992) atherosclerosis of the aortic arch and proximal descending aorta.  12. PULMONARY VEINS: Anomalous pulmonary venous return was not noted. Flow reversal in the LUPV was noted d/t severe MR.  13. PERICARDIUM: The pericardium appeared  normal and non-thickened.  There is a trivial circumferential pericardial effusion.  IMPRESSION:   1. 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  2. Moderate TR - RVSP 32 mmHg + RAP 3. Moderate biatrial enlargement 4. No LAA thrombus 5. No PFO by color doppler 6. Severe global hypokinesis 7. LVEF 25-30%  RECOMMENDATIONS:    1.  Given primary degenerative MV disease, would recommend structural heart team evaluation for MV repair or replacement. Consider mitra-clip given age and concomitant cardiomyopathy.  Time Spent Directly with the Patient:  60 minutes   Chrystie Nose, MD, [facility], FACP  [place]  [facility]  Medical Director of the [facility] Lipid Disorders &  Cardiovascular Risk Reduction Clinic Diplomate of the American Board of Clinical Lipidology Attending Cardiologist  Direct Dial: [phone]  Fax: [phone]  Website:  [email or web address] Nicely Ambrea Hegler 12/09/2019, 10:01 AM

## 2019-12-09 NOTE — Interval H&P Note (Signed)
History and Physical Interval Note:  12/09/2019 9:11 AM  Emma Hahn  has presented today for surgery, with the diagnosis of MITRAL REGURGITATION.  The various methods of treatment have been discussed with the patient and family. After consideration of risks, benefits and other options for treatment, the patient has consented to  Procedure(s): TRANSESOPHAGEAL ECHOCARDIOGRAM (TEE) (N/A) as a surgical intervention.  The patient's history has been reviewed, patient examined, no change in status, stable for surgery.  I have reviewed the patient's chart and labs.  Questions were answered to the patient's satisfaction.     Chrystie Nose

## 2019-12-09 NOTE — Progress Notes (Addendum)
Progress Note  Patient Name: Emma Hahn Date of Encounter: 12/09/2019  Primary Cardiologist: [name], MD  Subjective   SOB about the same. She does note less phlegm. No chest pain this morning. She is most concerned about getting back to her home and cleaning it.   Inpatient Medications    Scheduled Meds: . aspirin EC  81 mg Oral Daily  . atorvastatin  40 mg Oral Daily  . enoxaparin (LOVENOX) injection  40 mg Subcutaneous Q24H  . folic acid  1 mg Oral Daily  . furosemide  40 mg Intravenous BID  . multivitamin with minerals  1 tablet Oral Daily  . sodium chloride flush  3 mL Intravenous Q12H  . sodium chloride flush  3 mL Intravenous Q12H  . sodium chloride flush  3 mL Intravenous Q12H  . thiamine  100 mg Oral Daily   Or  . thiamine  100 mg Intravenous Daily   Continuous Infusions: . sodium chloride    . sodium chloride    . sodium chloride 20 mL/hr at 12/08/19 1535   PRN Meds: sodium chloride, sodium chloride, acetaminophen, diazepam, sodium chloride flush, sodium chloride flush   Vital Signs    Vitals:   12/08/19 2021 12/09/19 0516 12/09/19 0520 12/09/19 0722  BP: (!) 125/95 122/82  117/71  Pulse: 85 72  76  Resp: 16 16  20   Temp: 97.8 F (36.6 C) 98.2 F (36.8 C)  98 F (36.7 C)  TempSrc: Oral Oral  Oral  SpO2: 100% 100%  100%  Weight:   66.1 kg   Height:        Intake/Output Summary (Last 24 hours) at 12/09/2019 0856 Last data filed at 12/09/2019 0400 Gross per 24 hour  Intake 695.33 ml  Output 1300 ml  Net -604.67 ml   Last 3 Weights 12/09/2019 12/08/2019 12/07/2019  Weight (lbs) 145 lb 11.6 oz 148 lb 11.2 oz 147 lb 14.4 oz  Weight (kg) 66.1 kg 67.45 kg 67.087 kg     Telemetry    NSR, brief NSVT (9 beats), also episode last night of brief irregularity with wide complex beating - Personally Reviewed  Physical Exam   GEN: No acute distress.  HEENT: Normocephalic, atraumatic, sclera non-icteric. Neck: No JVD or  bruits. Cardiac: RRR no murmurs, rubs, or gallops.  Radials/DP/PT 1+ and equal bilaterally.  Respiratory: Diffusely diminished bilaterally. Breathing is unlabored. GI: Soft, nontender, non-distended, BS +x 4. MS: no deformity. Extremities: No clubbing or cyanosis. No edema. Distal pedal pulses are 2+ and equal bilaterally. Mild right groin ecchymosis but no active bleeding, hematoma or bruit Neuro:  AAOx3. Follows commands. Psych:  Responds to questions appropriately with a normal affect.  Labs    High Sensitivity Troponin:   Recent Labs  Lab 12/06/19 1404 12/06/19 1551  TROPONINIHS 28* 32*      Cardiac EnzymesNo results for input(s): TROPONINI in the last 168 hours. No results for input(s): TROPIPOC in the last 168 hours.   Chemistry Recent Labs  Lab 12/06/19 1404 12/06/19 1404 12/07/19 0433 12/07/19 0433 12/08/19 0348 12/08/19 0348 12/08/19 1022 12/08/19 1026 12/09/19 0324  NA 137   < > 137   < > 136   < > 140  140 133* 136  K 3.7   < > 3.5   < > 4.7   < > 5.0  5.0 4.8 4.4  CL 106   < > 100  --  99  --   --   --  102  CO2 18*   < > 22  --  24  --   --   --  24  GLUCOSE 149*   < > 285*  --  186*  --   --   --  133*  BUN 15   < > 21  --  28*  --   --   --  23  CREATININE 0.81   < > 1.07*  --  1.01*  --   --   --  0.90  CALCIUM 8.9   < > 9.7  --  9.3  --   --   --  9.2  PROT 6.5  --   --   --   --   --   --   --   --   ALBUMIN 3.2*  --   --   --   --   --   --   --   --   AST 23  --   --   --   --   --   --   --   --   ALT 16  --   --   --   --   --   --   --   --   ALKPHOS 40  --   --   --   --   --   --   --   --   BILITOT 1.2  --   --   --   --   --   --   --   --   GFRNONAA >60   < > 56*  --  60*  --   --   --  >60  ANIONGAP 13   < > 15  --  13  --   --   --  10   < > = values in this interval not displayed.     Hematology Recent Labs  Lab 12/08/19 0348 12/08/19 1022 12/08/19 1026 12/08/19 1317 12/09/19 0324  WBC 11.6*  --   --  10.6* 9.5  RBC 4.45   --   --  4.81 4.39  HGB 12.5   < > 13.3 13.3 11.9*  HCT 38.7   < > 39.0 42.1 38.1  MCV 87.0  --   --  87.5 86.8  MCH 28.1  --   --  27.7 27.1  MCHC 32.3  --   --  31.6 31.2  RDW 14.9  --   --  15.0 14.7  PLT 214  --   --  194 193   < > = values in this interval not displayed.    BNP Recent Labs  Lab 12/06/19 1404 12/08/19 0349  BNP 1,862.8* 1,693.0*     DDimer No results for input(s): DDIMER in the last 168 hours.   Radiology    CARDIAC CATHETERIZATION  Result Date: 12/08/2019 Mild - moderate right heart pressure elevation with mild pulmonary hypertension (mean PA 32 mmHg). Normal coronary arteries. Echo documentation of EF at 25 to 30%. 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  Smoking cessation.   Cardiac Studies   2D echo 12/06/19  1. Left ventricular ejection fraction, by estimation, is 25 to 30%. The  left ventricle has severely decreased function. The left ventricle  demonstrates global hypokinesis. There is mild left ventricular  hypertrophy. Left ventricular diastolic parameters  are consistent with Grade II  diastolic dysfunction (pseudonormalization).  Elevated left atrial pressure.  2. Right ventricular systolic function is normal. The right ventricular  size is normal. There is moderately elevated pulmonary artery systolic  pressure. The estimated right ventricular systolic pressure is 59.5 mmHg.  3. Left atrial size was moderately dilated.  4. Right atrial size was moderately dilated.  5. Moderate pleural effusion in the left lateral region.  6. The mitral valve is degenerative. Moderate to severe mitral valve  regurgitation. No evidence of mitral stenosis.  7. Tricuspid valve regurgitation is severe.  8. The aortic valve is normal in structure. Aortic valve regurgitation is  not visualized. No aortic stenosis is present.  9.  The inferior vena cava is normal in size with greater than 50%  respiratory variability, suggesting right atrial pressure of 3 mmHg.   Cath 12/08/19 Mild - moderate right heart pressure elevation with mild pulmonary hypertension (mean PA 32 mmHg).  Normal coronary arteries.  Echo documentation of EF at 25 to 30%.  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  Smoking cessation.   Patient Profile     69 y.o. female with COPD, borderline DM, ongoing tobacco use, habitual alcohol use, and recent abnormal echo(details unclear, 06/2019). She presented to [facility] with worsening SOB and chest pain for several months, found to have new heart failure (EF 25-30%) and mitral/tricuspid regurgitation  Assessment & Plan    1. Acute on possibly chronic combined heart failure/NICM - symptoms only marginally improved despite diuresis - R/LHC with findings above - etiology of cardiomyopathy not entirely clear - Dr. [name] doubted primarily related to alcohol given relative lack of dilation, infiltrative disease felt less likely given lack of significant LVH, not enough ectopy on telemetry to suggest arrhythmogenic, question whether this could be related to a prior viral myocarditis or mitral regurgitation - mild decline in Hgb but no significant hematoma, back pain/abdominal pain not currently present, VSS - continue IV Lasix for now - BP slightly improved now so will discuss GDMT with MD - add back low dose BB this AM - plan TEE to evaluate valvular disease  2. Moderate-severe MR, severe TR, and mild pulmonary HTN - plan TEE today to further evaluate - risk/benefits discussed with patient yesterday, remains agreeable to proceeding  3. Tobacco/alcohol use - ultimate goal of cessation advised -  CIWA per primary team - she is getting antsy to go home today and I think this  might be a sequaele of going without these substances. Will defer to internal medicine whether she needs to begin a regimen of CIWA-directed Ativan post procedure. Offered nicotine patch but she declined  4. Hyperlipidemia - LDL 143 - started atorvastatin (normal baseline LFTs)  5. COPD - pre-admission was instructed to f/u with pulmonology per notes but did not wish to do so - likely further diminishing her reserve  6. Prolonged QT interval  - lytes OK - avoid additional QT prolonging agents - QTc appears about on tele this AM  7. Brief NSVT/occasional PVCs - add back low dose BB today - lytes OK - will review with MD given NICM  8. Unintentional weight loss - 13lb from baselineover last 1 year - TSH wnl - patient previously told to get colonoscopy and mammogram as OP but elected to defer, will need to reconsider after discharge  For questions or updates, please contact [facility] Please consult [email or web address] for contact info under Cardiology/STEMI.  Signed, Laurann Montana, PA-C 12/09/2019, 8:56 AM    Personally seen and examined. Agree with APP above with the following comments: Briefly 69 yo F with HF possibly due to Severe MR Patient notes that she is ready to go home Exam notable for clear lungs bilaterally; able to ambulate without hypoxia of tachypnea Tele notable for one 10 beat run of MMVT; one 10 beat run of PMVT- will add back BB Personally reviewed relevant tests severe eccentric MR in the setting of Prolpase Would recommend transition to PO Lasix tomorrow; reaching out to structural heart team; may be best served with percutaneous intervention.  Possible DC tomorrow if ectopy improves, and euvolemic.  Will need follow up with me in outpatient if DC.  [name], MD

## 2019-12-09 NOTE — Progress Notes (Deleted)
*  PRELIMINARY RESULTS* Echocardiogram Echocardiogram Transesophageal has been performed.  Emma Hahn 12/09/2019, 10:09 AM

## 2019-12-09 NOTE — Anesthesia Postprocedure Evaluation (Signed)
Anesthesia Post Note  Patient: Jennene Downie  Procedure(s) Performed: TRANSESOPHAGEAL ECHOCARDIOGRAM (TEE) (N/A )     Patient location during evaluation: Endoscopy Anesthesia Type: MAC Level of consciousness: awake and alert Pain management: pain level controlled Vital Signs Assessment: post-procedure vital signs reviewed and stable Respiratory status: spontaneous breathing, nonlabored ventilation, respiratory function stable and patient connected to nasal cannula oxygen Cardiovascular status: blood pressure returned to baseline and stable Postop Assessment: no apparent nausea or vomiting Anesthetic complications: no   No complications documented.  Last Vitals:  Vitals:   12/09/19 1025 12/09/19 1233  BP: (!) 144/81 (!) 133/97  Pulse: 91 90  Resp: (!) 23 18  Temp:    SpO2: (!) 85% 100%    Last Pain:  Vitals:   12/09/19 1025  TempSrc:   PainSc: 0-No pain                 Siaosi Alter L Suhaas Agena

## 2019-12-09 NOTE — Progress Notes (Signed)
  Echocardiogram Echocardiogram Transesophageal has been performed.  Emma Hahn 12/09/2019, 10:13 AM

## 2019-12-09 NOTE — Anesthesia Preprocedure Evaluation (Signed)
Anesthesia Evaluation  Patient identified by MRN, date of birth, ID band Patient awake    Reviewed: Allergy & Precautions, NPO status , Patient's Chart, lab work & pertinent test results  Airway Mallampati: II  TM Distance: >3 FB Neck ROM: Full    Dental  (+) Poor Dentition, Missing, Chipped, Dental Advisory Given,    Pulmonary COPD, Current Smoker and Patient abstained from smoking.,    Pulmonary exam normal breath sounds clear to auscultation       Cardiovascular +CHF  Normal cardiovascular exam+ Valvular Problems/Murmurs MR  Rhythm:Regular Rate:Normal  TTE 11/2019 1. Left ventricular ejection fraction, by estimation, is 25 to 30%. The  left ventricle has severely decreased function. The left ventricle  demonstrates global hypokinesis. There is mild left ventricular  hypertrophy. Left ventricular diastolic parameters  are consistent with Grade II diastolic dysfunction (pseudonormalization).  Elevated left atrial pressure.  2. Right ventricular systolic function is normal. The right ventricular  size is normal. There is moderately elevated pulmonary artery systolic  pressure. The estimated right ventricular systolic pressure is 62.6 mmHg.  3. Left atrial size was moderately dilated.  4. Right atrial size was moderately dilated.  5. Moderate pleural effusion in the left lateral region.  6. The mitral valve is degenerative. Moderate to severe mitral valve  regurgitation. No evidence of mitral stenosis.  7. Tricuspid valve regurgitation is severe.  8. The aortic valve is normal in structure. Aortic valve regurgitation is  not visualized. No aortic stenosis is present.  9. The inferior vena cava is normal in size with greater than 50%  respiratory variability, suggesting right atrial pressure of 3 mmHg  RHC/LHC Mild - moderate right heart pressure elevation with mild pulmonary hypertension (mean PA 32 mmHg). Normal  coronary arteries. Echo documentation of EF at 25 to 30%. 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  Smoking cessation    Neuro/Psych negative neurological ROS  negative psych ROS   GI/Hepatic negative GI ROS, Neg liver ROS,   Endo/Other  negative endocrine ROS  Renal/GU negative Renal ROS  negative genitourinary   Musculoskeletal negative musculoskeletal ROS (+)   Abdominal   Peds  Hematology negative hematology ROS (+)   Anesthesia Other Findings Presented on 11/20 with acute exacerbation of CHF  Reproductive/Obstetrics                             Anesthesia Physical Anesthesia Plan  ASA: III  Anesthesia Plan: MAC   Post-op Pain Management:    Induction: Intravenous  PONV Risk Score and Plan: Propofol infusion and Treatment may vary due to age or medical condition  Airway Management Planned: Natural Airway  Additional Equipment:   Intra-op Plan:   Post-operative Plan:   Informed Consent: I have reviewed the patients History and Physical, chart, labs and discussed the procedure including the risks, benefits and alternatives for the proposed anesthesia with the patient or authorized representative who has indicated his/her understanding and acceptance.   Patient has DNR.  Discussed DNR with patient and Suspend DNR.   Dental advisory given  Plan Discussed with: CRNA  Anesthesia Plan Comments:         Anesthesia Quick Evaluation

## 2019-12-09 NOTE — Assessment & Plan Note (Addendum)
-  Patient endorses moderate use of alcohol outpatient.  She was counseled on admission to try cutting back on this -No obvious signs of withdrawal during hospitalization

## 2019-12-10 DIAGNOSIS — I5043 Acute on chronic combined systolic (congestive) and diastolic (congestive) heart failure: Secondary | ICD-10-CM | POA: Diagnosis not present

## 2019-12-10 DIAGNOSIS — I340 Nonrheumatic mitral (valve) insufficiency: Secondary | ICD-10-CM

## 2019-12-10 DIAGNOSIS — Z599 Problem related to housing and economic circumstances, unspecified: Secondary | ICD-10-CM

## 2019-12-10 LAB — CBC WITH DIFFERENTIAL/PLATELET
Abs Immature Granulocytes: 0.03 10*3/uL (ref 0.00–0.07)
Basophils Absolute: 0 10*3/uL (ref 0.0–0.1)
Basophils Relative: 0 %
Eosinophils Absolute: 0.2 10*3/uL (ref 0.0–0.5)
Eosinophils Relative: 2 %
HCT: 38.7 % (ref 36.0–46.0)
Hemoglobin: 12.3 g/dL (ref 12.0–15.0)
Immature Granulocytes: 0 %
Lymphocytes Relative: 46 %
Lymphs Abs: 4.1 10*3/uL — ABNORMAL HIGH (ref 0.7–4.0)
MCH: 27.3 pg (ref 26.0–34.0)
MCHC: 31.8 g/dL (ref 30.0–36.0)
MCV: 86 fL (ref 80.0–100.0)
Monocytes Absolute: 0.7 10*3/uL (ref 0.1–1.0)
Monocytes Relative: 8 %
Neutro Abs: 4.1 10*3/uL (ref 1.7–7.7)
Neutrophils Relative %: 44 %
Platelets: 181 10*3/uL (ref 150–400)
RBC: 4.5 MIL/uL (ref 3.87–5.11)
RDW: 14.7 % (ref 11.5–15.5)
WBC: 9.2 10*3/uL (ref 4.0–10.5)
nRBC: 0 % (ref 0.0–0.2)

## 2019-12-10 LAB — BASIC METABOLIC PANEL
Anion gap: 14 (ref 5–15)
BUN: 28 mg/dL — ABNORMAL HIGH (ref 8–23)
CO2: 23 mmol/L (ref 22–32)
Calcium: 9.1 mg/dL (ref 8.9–10.3)
Chloride: 100 mmol/L (ref 98–111)
Creatinine, Ser: 1.2 mg/dL — ABNORMAL HIGH (ref 0.44–1.00)
GFR, Estimated: 49 mL/min — ABNORMAL LOW (ref >60)
Glucose, Bld: 160 mg/dL — ABNORMAL HIGH (ref 70–99)
Potassium: 4 mmol/L (ref 3.5–5.1)
Sodium: 137 mmol/L (ref 135–145)

## 2019-12-10 LAB — MAGNESIUM: Magnesium: 1.9 mg/dL (ref 1.7–2.4)

## 2019-12-10 MED ORDER — FUROSEMIDE 40 MG PO TABS
40.0000 mg | ORAL_TABLET | Freq: Two times a day (BID) | ORAL | 3 refills | Status: DC
Start: 2019-12-10 — End: 2020-05-31

## 2019-12-10 MED ORDER — METOPROLOL SUCCINATE ER 25 MG PO TB24
25.0000 mg | ORAL_TABLET | Freq: Every day | ORAL | Status: DC
  Administered 2019-12-10: 25 mg via ORAL
  Filled 2019-12-10: qty 1

## 2019-12-10 MED ORDER — FUROSEMIDE 40 MG PO TABS
40.0000 mg | ORAL_TABLET | Freq: Two times a day (BID) | ORAL | Status: DC
  Administered 2019-12-10: 40 mg via ORAL
  Filled 2019-12-10: qty 1

## 2019-12-10 MED ORDER — METOPROLOL SUCCINATE ER 25 MG PO TB24
25.0000 mg | ORAL_TABLET | Freq: Every day | ORAL | 3 refills | Status: DC
Start: 2019-12-10 — End: 2020-05-31

## 2019-12-10 MED ORDER — ATORVASTATIN CALCIUM 40 MG PO TABS
40.0000 mg | ORAL_TABLET | Freq: Every day | ORAL | 3 refills | Status: DC
Start: 2019-12-10 — End: 2020-05-31

## 2019-12-10 MED ORDER — ASPIRIN 81 MG PO TBEC
81.0000 mg | DELAYED_RELEASE_TABLET | Freq: Every day | ORAL | 11 refills | Status: DC
Start: 2019-12-10 — End: 2021-08-15

## 2019-12-10 NOTE — Plan of Care (Deleted)

## 2019-12-10 NOTE — Progress Notes (Signed)
Progress Note  Patient Name: Emma Hahn Date of Encounter: 12/10/2019  Primary Cardiologist: [name], MD  Subjective   Tearful this morning.  Walked with PT yesterday PM without SOB, DOE and with good strength.  Wants to go home for Thanksgiving.  Aware of dietary restrictions.  Inpatient Medications    Scheduled Meds: . aspirin EC  81 mg Oral Daily  . atorvastatin  40 mg Oral Daily  . enoxaparin (LOVENOX) injection  40 mg Subcutaneous Q24H  . folic acid  1 mg Oral Daily  . furosemide  40 mg Intravenous BID  . metoprolol tartrate  12.5 mg Oral BID  . multivitamin with minerals  1 tablet Oral Daily  . sodium chloride flush  3 mL Intravenous Q12H  . sodium chloride flush  3 mL Intravenous Q12H  . sodium chloride flush  3 mL Intravenous Q12H  . thiamine  100 mg Oral Daily   Or  . thiamine  100 mg Intravenous Daily   Continuous Infusions: . sodium chloride    . sodium chloride     PRN Meds: sodium chloride, sodium chloride, acetaminophen, diazepam, sodium chloride flush, sodium chloride flush   Vital Signs    Vitals:   12/09/19 1233 12/09/19 1842 12/09/19 2055 12/10/19 0500  BP: (!) 133/97 111/74 111/81 129/85  Pulse: 90  86 77  Resp: 18  18 18   Temp: 98.4 F (36.9 C)  97.7 F (36.5 C) 98 F (36.7 C)  TempSrc: Oral  Oral Oral  SpO2: 100%  100% 96%  Weight:    66.7 kg  Height:        Intake/Output Summary (Last 24 hours) at 12/10/2019 0750 Last data filed at 12/10/2019 0600 Gross per 24 hour  Intake 1037.33 ml  Output 2600 ml  Net -1562.67 ml   Last 3 Weights 12/10/2019 12/09/2019 12/09/2019  Weight (lbs) 147 lb 0.8 oz 145 lb 11.6 oz 145 lb 11.6 oz  Weight (kg) 66.7 kg 66.1 kg 66.1 kg     Telemetry    SR without NSVT - Personally Reviewed  Physical Exam   GEN: No acute distress.  HEENT: Normocephalic, atraumatic, sclera non-icteric. Neck: No JVD or bruits. Cardiac: RRR no murmurs, rubs, or gallops.  Respiratory: CTAB  bilaterally (sitting up in the bed) GI: Soft, nontender, non-distended, BS +x 4. MS: no deformity. Extremities: No clubbing or cyanosis. No edema. Distal pedal pulses are 2+ and equal bilaterally.  Neuro:  AAOx3. Follows commands. Psych:  Responds to questions appropriately with a tearful affect.  Labs    High Sensitivity Troponin:   Recent Labs  Lab 12/06/19 1404 12/06/19 1551  TROPONINIHS 28* 32*      Cardiac EnzymesNo results for input(s): TROPONINI in the last 168 hours. No results for input(s): TROPIPOC in the last 168 hours.   Chemistry Recent Labs  Lab 12/06/19 1404 12/07/19 0433 12/08/19 0348 12/08/19 1022 12/08/19 1026 12/09/19 0324 12/10/19 0221  NA 137   < > 136   < > 133* 136 137  K 3.7   < > 4.7   < > 4.8 4.4 4.0  CL 106   < > 99  --   --  102 100  CO2 18*   < > 24  --   --  24 23  GLUCOSE 149*   < > 186*  --   --  133* 160*  BUN 15   < > 28*  --   --  23 28*  CREATININE  0.81   < > 1.01*  --   --  0.90 1.20*  CALCIUM 8.9   < > 9.3  --   --  9.2 9.1  PROT 6.5  --   --   --   --   --   --   ALBUMIN 3.2*  --   --   --   --   --   --   AST 23  --   --   --   --   --   --   ALT 16  --   --   --   --   --   --   ALKPHOS 40  --   --   --   --   --   --   BILITOT 1.2  --   --   --   --   --   --   GFRNONAA >60   < > 60*  --   --  >60 49*  ANIONGAP 13   < > 13  --   --  10 14   < > = values in this interval not displayed.     Hematology Recent Labs  Lab 12/08/19 1317 12/09/19 0324 12/10/19 0221  WBC 10.6* 9.5 9.2  RBC 4.81 4.39 4.50  HGB 13.3 11.9* 12.3  HCT 42.1 38.1 38.7  MCV 87.5 86.8 86.0  MCH 27.7 27.1 27.3  MCHC 31.6 31.2 31.8  RDW 15.0 14.7 14.7  PLT 194 193 181    BNP Recent Labs  Lab 12/06/19 1404 12/08/19 0349  BNP 1,862.8* 1,693.0*     DDimer No results for input(s): DDIMER in the last 168 hours.   Radiology    CARDIAC CATHETERIZATION  Result Date: 12/08/2019 Mild - moderate right heart pressure elevation with mild  pulmonary hypertension (mean PA 32 mmHg). Normal coronary arteries. Echo documentation of EF at 25 to 30%. 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  Smoking cessation.  ECHO TEE  Result Date: 12/09/2019    TRANSESOPHOGEAL ECHO REPORT   Patient Name:   Emma Hahn Date of Exam: 12/09/2019 Medical Rec #:  [id number]    Height:       64.0 in Accession #:    [id number]   Weight:       145.7 lb Date of Birth:  April 30, 1950    BSA:          1.710 m Patient Age:    69 years     BP:           117/71 mmHg Patient Gender: F            HR:           92 bpm. Exam Location:  Inpatient Procedure: Transesophageal Echo Indications:    Mitral regurgitation  History:        Patient has prior history of Echocardiogram examinations, most                 recent 12/06/2019. COPD; Risk Factors:Current Smoker and ETOH.  Sonographer:    Eulah Pont RDCS Referring Phys: 9 DAYNA N DUNN PROCEDURE: After discussion of the risks and benefits of a TEE, an informed consent was obtained from the patient. The transesophogeal probe was passed without difficulty through the esophogus of the patient. Local oropharyngeal anesthetic was provided with Cetacaine. Sedation performed by different physician. The patient was monitored while under deep  sedation. Anesthestetic sedation was provided intravenously by Anesthesiology: 152.37mg  of Propofol, 100mg  of Lidocaine. The patient's vital signs; including heart rate, blood pressure, and oxygen saturation; remained stable throughout the procedure. The patient developed no complications during the procedure. IMPRESSIONS  1. Left ventricular ejection fraction, by estimation, is 25 to 30%. The left ventricle has severely decreased function. The left ventricle demonstrates global hypokinesis. There is mild left ventricular hypertrophy. Left ventricular diastolic function could not be  evaluated.  2. Right ventricular systolic function is normal. The right ventricular size is normal.  3. Left atrial size was moderately dilated. No left atrial/left atrial appendage thrombus was detected.  4. Right atrial size was moderately dilated.  5. The pericardial effusion is circumferential.  6. Mild mitral subvalvular thickening/fibrosis.  7. The mitral valve is myxomatous. Severe mitral valve regurgitation.  8. The tricuspid valve is abnormal. Tricuspid valve regurgitation is moderate.  9. The aortic valve is tricuspid. Aortic valve regurgitation is not visualized. 10. There is mild (Grade II) layered plaque involving the transverse and descending aorta. Comparison(s): A prior study was performed on 12/06/2019. LVEF unchanged at 25-30%, severe MR. Conclusion(s)/Recommendation(s): Recommend evaluation by structural heart team for possible mitraclip. FINDINGS  Left Ventricle: Left ventricular ejection fraction, by estimation, is 25 to 30%. The left ventricle has severely decreased function. The left ventricle demonstrates global hypokinesis. The left ventricular internal cavity size was normal in size. There is mild left ventricular hypertrophy. Left ventricular diastolic function could not be evaluated. Right Ventricle: The right ventricular size is normal. No increase in right ventricular wall thickness. Right ventricular systolic function is normal. Left Atrium: Left atrial size was moderately dilated. Spontaneous echo contrast was present in the left atrium. No left atrial/left atrial appendage thrombus was detected. Right Atrium: Right atrial size was moderately dilated. Pericardium: Trivial pericardial effusion is present. The pericardial effusion is circumferential. Mitral Valve: The mitral valve is myxomatous. There is moderate thickening of the mitral valve leaflet(s). Moderately decreased mobility of the mitral valve leaflets. Mild mitral subvalvular thickening/fibrosis. There is mild chordal  rupture of the posterior leaflet of the mitral valve. Severe mitral valve regurgitation, with eccentric posteriorly directed jet. Tricuspid Valve: The tricuspid valve is abnormal. Tricuspid valve regurgitation is moderate. Aortic Valve: The aortic valve is tricuspid. Aortic valve regurgitation is not visualized. Pulmonic Valve: The pulmonic valve was grossly normal. Pulmonic valve regurgitation is mild. Aorta: The aortic root and ascending aorta are structurally normal, with no evidence of dilitation. There is mild (Grade II) layered plaque involving the transverse and descending aorta. Venous: The pulmonary veins were not well visualized. A pattern of systolic flow reversal, suggestive of severe mitral regurgitation is recorded from the left upper pulmonary vein. IAS/Shunts: No atrial level shunt detected by color flow Doppler.  LEFT VENTRICLE PLAX 2D LVOT diam:     1.70 cm LVOT Area:     2.27 cm  MR Peak grad:    157.0 mmHg MR Mean grad:    92.0 mmHg   SHUNTS MR Vmax:         626.50 cm/s Systemic Diam: 1.70 cm MR Vmean:        438.5 cm/s MR PISA:         3.08 cm MR PISA Eff ROA: 19 mm MR PISA Radius:  0.70 cm MD Electronically signed by 12/08/2019 MD Signature Date/Time: 12/09/2019/3:11:14 PM    Final     Cardiac Studies   2D echo 12/06/19  1. Left ventricular ejection  fraction, by estimation, is 25 to 30%. The  left ventricle has severely decreased function. The left ventricle  demonstrates global hypokinesis. There is mild left ventricular  hypertrophy. Left ventricular diastolic parameters  are consistent with Grade II diastolic dysfunction (pseudonormalization).  Elevated left atrial pressure.  2. Right ventricular systolic function is normal. The right ventricular  size is normal. There is moderately elevated pulmonary artery systolic  pressure. The estimated right ventricular systolic pressure is 59.5 mmHg.  3. Left atrial size was moderately dilated.  4. Right  atrial size was moderately dilated.  5. Moderate pleural effusion in the left lateral region.  6. The mitral valve is degenerative. Moderate to severe mitral valve  regurgitation. No evidence of mitral stenosis.  7. Tricuspid valve regurgitation is severe.  8. The aortic valve is normal in structure. Aortic valve regurgitation is  not visualized. No aortic stenosis is present.  9. The inferior vena cava is normal in size with greater than 50%  respiratory variability, suggesting right atrial pressure of 3 mmHg.   Cath 12/08/19 Mild - moderate right heart pressure elevation with mild pulmonary hypertension (mean PA 32 mmHg).  Normal coronary arteries.  Echo documentation of EF at 25 to 30%.  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  Smoking cessation.   Patient Profile     69 y.o. female with COPD, borderline DM, ongoing tobacco use, habitual alcohol use, and recent abnormal echo(details unclear, 06/2019). She presented to [facility] with worsening SOB and chest pain for several months, found to have new heart failure (EF 25-30%) and mitral/tricuspid regurgitation  Assessment & Plan    1. Acute on possibly chronic combined heart failure/NICM - symptoms only marginally improved despite diuresis - R/LHC with findings above - transitioned to 40 mg PO BID lasix and will need BMET in one week - starting low dose metoprolol succinate for HFrEF (NSVT has resolved) - suspect etiology is HF related  2. Moderate-severe MR, severe TR, and mild pulmonary HTN - will see structural heart team for possible MitraClip  3. Tobacco/alcohol use - ultimate goal of cessation advised  4. Hyperlipidemia - LDL 143 - continue atorvastatin (normal baseline LFTs)  5. COPD - pre-admission was instructed to f/u with pulmonology per notes but did not wish to do so - likely  further diminishing her reserve  6. Prolonged QT interval  - lytes OK - avoid additional QT prolonging agents - QTc appears about on tele this AM  7. Brief NSVT/occasional PVCs - no runs of NSVT last 24 hours - continue BB  8. Unintentional weight loss - 13lb from baselineover last 1 year - TSH wnl - patient previously told to get colonoscopy and mammogram as OP but elected to defer, will need to reconsider after discharge  Had 12/21/19 Structural Heart Follow up (10:00 AM). Will arrange follow up with me.  Discussed with patient and primary MD.  Reasonable DC  For questions or updates, please contact [facility] Please consult [email or web address] for contact info under Cardiology/STEMI.  Signed, [name], MD 12/10/2019, 7:50 AM

## 2019-12-10 NOTE — Assessment & Plan Note (Signed)
-  Patient expressed some hesitancy with affording her medications going forward given new prescriptions.  Offered for patient to remain in hospital due to holiday and [facility] pharmacy not opening until Friday.  Patient expressed wish to go home and will pick up her prescriptions from the [facility] pharmacy -In the future, consider transferring prescriptions to the [facility] pharmacy for further assistance if affordability and compliance become an issue

## 2019-12-10 NOTE — Discharge Summary (Signed)
Physician Discharge Summary   Emma Hahn VZD:[id number] DOB: 05-26-1950 DOA: 12/06/2019  PCP: Jackie Plum, MD  Admit date: 12/06/2019 Discharge date: 12/10/2019  Admitted From: Home Disposition: Home Discharging physician: Lewie Chamber, MD  Recommendations for Outpatient Follow-up:  1. Follow-up with cardiology and structural heart team 2. Consider transferring prescriptions to [facility] pharmacy when able 3. Continue encouragement of smoking cessation and decreasing alcohol use   Patient discharged to home in Discharge Condition: stable CODE STATUS: Full Diet recommendation:  Diet Orders (From admission, onward)    Start     Ordered   12/10/19 0000  Diet - low sodium heart healthy        12/10/19 0917   12/09/19 1343  Diet Heart Room service appropriate? Yes; Fluid consistency: Thin  Diet effective now       Question Answer Comment  Room service appropriate? Yes   Fluid consistency: Thin      12/09/19 1342          Hospital Course: Emma Hahn is a 69 y.o. female with PMH prediabetes, COPD, ongoing tobacco use.  She has not seen primary care in several years.  She did however present to a physician earlier this year around June due to shortness of breath and chest pain.  At that time she was started on multiple medications which she cannot remember other than nitroglycerin.  She was to follow-up with cardiology however did not do so.  She also ran out of refills of her medications therefore did not think she needed to be on them any further. She continues to smoke.  She also endorses occasional alcohol use at home but states no more than 1 or 2 drinks at a time for approximately 3-4 nights per week. She endorses increased swelling in her abdomen lately.  She also has difficulty sleeping at night due to inability to lay flat. On work-up in the ER she was found to be hypoxic and placed on 2 L nasal cannula oxygen.  BNP was elevated.  CXR showed cardiomegaly and  interstitial edema. She was admitted for further cardiac work-up and cardiology evaluation. She was evaluated by cardiology with recommendations for heart catheterization and underwent on 11/23. She had no significant coronary disease.  PWCP was elevated.  In setting of her neg cath, she was recommended to undergo TEE to further evaluate. TEE on 11/24 showed severe MR with recommendations for evaluation from the structural heart team. At discharge she was continued on oral Lasix.  She will have close outpatient follow-up with cardiology and the structural heart team for further plans regarding repair of severe MR. Patient was anxious and amenable for discharge home.  Importance of medication compliance was stressed to her prior to discharge as well.   * Acute on chronic combined systolic and diastolic CHF (congestive heart failure) (HCC) - EF 25-30%, Gr II DD. Etiology possibly underlying CAD; lower chance that etoh use contributing large role but did counsel her to monitor intake; tobacco use also counseled for cessation - s/p lasix on admission; now resumed after cath per cardiology  -Continue aspirin -Patient will undergo TEE on 12/09/2019 to further evaluate: Severe MR found, patient referred to structural heart team for outpatient repair  Chest pain - troponin indeterminate; given her worsening SOB/CP at home this does suggest USA/crescendo angina - appreciate cardiology consult - cath surprisingly negative for ischemic heart disease - continue asa; statin added per cardiology - TEE  Revealed severe MR; patient being referred for further outpt followup  for repair   Acute respiratory failure with hypoxia (HCC) -Patient requiring 2 L oxygen.  Not typically on oxygen at home.  Etiology considered volume overload from new diagnosis of CHF -Continue diuresis -Continue weaning oxygen as able  Financial difficulties -Patient expressed some hesitancy with affording her medications going  forward given new prescriptions.  Offered for patient to remain in hospital due to holiday and [facility] pharmacy not opening until Friday.  Patient expressed wish to go home and will pick up her prescriptions from the [facility] pharmacy -In the future, consider transferring prescriptions to the [facility] pharmacy for further assistance if affordability and compliance become an issue  Alcohol use -Patient endorses moderate use of alcohol outpatient.  She was counseled on admission to try cutting back on this -No obvious signs of withdrawal during hospitalization  Prediabetes -A1c 5.8% -Continue diet control  Prolonged QT interval -QTC on admission 510 -Monitor for any QT prolonging agents -Continue telemetry  Tobacco abuse -Patient strongly encouraged on tobacco cessation; states that she is down to approximately 4 cigarettes/day - patient refusing nicotine patch; currently has a straw cut in half that she is pretending is a cigarette which seems to be satisfying the urge    The patient's chronic medical conditions were treated accordingly per the patient's home medication regimen except as noted.  On day of discharge, patient was felt deemed stable for discharge. Patient/family member advised to call PCP or come back to ER if needed.   Principal Diagnosis: Acute on chronic combined systolic and diastolic CHF (congestive heart failure) (HCC)  Discharge Diagnoses: Active Hospital Problems   Diagnosis Date Noted  . Acute on chronic combined systolic and diastolic CHF (congestive heart failure) (HCC) 12/06/2019    Priority: High  . Chest pain 12/06/2019    Priority: High  . Acute respiratory failure with hypoxia (HCC) 12/08/2019    Priority: Medium  . Financial difficulties 12/10/2019    Priority: Low  . Alcohol use 12/09/2019  . Tobacco abuse 12/06/2019  . Prolonged QT interval 12/06/2019  . Prediabetes 12/06/2019    Resolved Hospital Problems  No resolved problems to display.     Discharge Instructions    Diet - low sodium heart healthy   Complete by: As directed    Increase activity slowly   Complete by: As directed      Allergies as of 12/10/2019   No Known Allergies     Medication List    TAKE these medications   albuterol 108 (90 Base) MCG/ACT inhaler Commonly known as: VENTOLIN HFA Inhale 1-2 puffs into the lungs every 6 (six) hours as needed for wheezing or shortness of breath.   aspirin 81 MG EC tablet Take 1 tablet (81 mg total) by mouth daily. Swallow whole.   atorvastatin 40 MG tablet Commonly known as: LIPITOR Take 1 tablet (40 mg total) by mouth daily.   furosemide 40 MG tablet Commonly known as: LASIX Take 1 tablet (40 mg total) by mouth 2 (two) times daily.   metoprolol succinate 25 MG 24 hr tablet Commonly known as: TOPROL-XL Take 1 tablet (25 mg total) by mouth daily.   nitroGLYCERIN 0.4 MG SL tablet Commonly known as: NITROSTAT Place 0.4 mg under the tongue as needed for chest pain.       Follow-up Information    Jackie Plum, MD.   Specialty: Internal Medicine Why: Please follow up in a week Contact information: [address] [place] [address] [phone]  No Known Allergies  Consultations: Cardiology  Discharge Exam: BP 121/77 (BP Location: Right Arm)   Pulse 84   Temp (!) 97.4 F (36.3 C) (Oral)   Resp 18   Ht  (1.626 m)   Wt 66.7 kg   SpO2 94%   BMI 25.24 kg/m  General appearance: alert, cooperative and no distress Head: Normocephalic, without obvious abnormality, atraumatic Eyes: EOMI Lungs: clear to auscultation bilaterally Heart: regular rate and rhythm and S1, S2 normal Abdomen: Mildly distended, soft, nontender, bowel sounds present Extremities: No lower extremity edema Skin: mobility and turgor normal Neurologic: Grossly normal  The results of significant diagnostics from this hospitalization (including imaging, microbiology, ancillary  and laboratory) are listed below for reference.   Microbiology: Recent Results (from the past 240 hour(s))  Respiratory Panel by RT PCR (Flu A&B, Covid) - Nasopharyngeal Swab     Status: None   Collection Time: 12/06/19  2:00 PM   Specimen: Nasopharyngeal Swab; Nasopharyngeal(NP) swabs in vial transport medium  Result Value Ref Range Status   SARS Coronavirus 2 by RT PCR NEGATIVE NEGATIVE Final    Comment: (NOTE) SARS-CoV-2 target nucleic acids are NOT DETECTED.  The SARS-CoV-2 RNA is generally detectable in upper respiratoy specimens during the acute phase of infection. The lowest concentration of SARS-CoV-2 viral copies this assay can detect is 131 copies/mL. A negative result does not preclude SARS-Cov-2 infection and should not be used as the sole basis for treatment or other patient management decisions. A negative result may occur with  improper specimen collection/handling, submission of specimen other than nasopharyngeal swab, presence of viral mutation(s) within the areas targeted by this assay, and inadequate number of viral copies (<131 copies/mL). A negative result must be combined with clinical observations, patient history, and epidemiological information. The expected result is Negative.  Fact Sheet for Patients:  https://www.moore.com/  Fact Sheet for Healthcare Providers:  https://www.young.biz/  This test is no t yet approved or cleared by the [place] FDA and  has been authorized for detection and/or diagnosis of SARS-CoV-2 by FDA under an Emergency Use Authorization (EUA). This EUA will remain  in effect (meaning this test can be used) for the duration of the COVID-19 declaration under Section 564(b)(1) of the Act, 21 U.S.C. section 360bbb-3(b)(1), unless the authorization is terminated or revoked sooner.     Influenza A by PCR NEGATIVE NEGATIVE Final   Influenza B by PCR NEGATIVE NEGATIVE Final    Comment:  (NOTE) The Xpert Xpress SARS-CoV-2/FLU/RSV assay is intended as an aid in  the diagnosis of influenza from Nasopharyngeal swab specimens and  should not be used as a sole basis for treatment. Nasal washings and  aspirates are unacceptable for Xpert Xpress SARS-CoV-2/FLU/RSV  testing.  Fact Sheet for Patients: https://www.moore.com/  Fact Sheet for Healthcare Providers: https://www.young.biz/  This test is not yet approved or cleared by the [place] FDA and  has been authorized for detection and/or diagnosis of SARS-CoV-2 by  FDA under an Emergency Use Authorization (EUA). This EUA will remain  in effect (meaning this test can be used) for the duration of the  Covid-19 declaration under Section 564(b)(1) of the Act, 21  U.S.C. section 360bbb-3(b)(1), unless the authorization is  terminated or revoked. Performed at [facility], [address] [place], [place] [address]      Labs: BNP (last 3 results) Recent Labs    12/06/19 1404 12/08/19 0349  BNP 1,862.8* 1,693.0*   Basic Metabolic Panel: Recent Labs  Lab 12/06/19 1404 12/06/19 1404 12/07/19 0433 12/07/19 0433 12/08/19 0348 12/08/19 1022 12/08/19 1026 12/09/19 0324 12/10/19 0221  NA 137   < > 137   < > 136 140  140 133* 136 137  K 3.7   < > 3.5   < > 4.7 5.0  5.0 4.8 4.4 4.0  CL 106  --  100  --  99  --   --  102 100  CO2 18*  --  22  --  24  --   --  24 23  GLUCOSE 149*  --  285*  --  186*  --   --  133* 160*  BUN 15  --  21  --  28*  --   --  23 28*  CREATININE 0.81  --  1.07*  --  1.01*  --   --  0.90 1.20*  CALCIUM 8.9  --  9.7  --  9.3  --   --  9.2 9.1  MG  --   --  1.5*  --  2.6*  --   --  2.1 1.9  PHOS  --   --  3.3  --   --   --   --   --   --    < > = values in this interval not displayed.   Liver Function Tests: Recent Labs  Lab 12/06/19 1404  AST 23  ALT 16  ALKPHOS 40  BILITOT 1.2  PROT 6.5  ALBUMIN 3.2*   Recent Labs  Lab  12/06/19 1404  LIPASE 30   No results for input(s): AMMONIA in the last 168 hours. CBC: Recent Labs  Lab 12/06/19 1404 12/06/19 1404 12/08/19 0348 12/08/19 0348 12/08/19 1022 12/08/19 1026 12/08/19 1317 12/09/19 0324 12/10/19 0221  WBC 7.1  --  11.6*  --   --   --  10.6* 9.5 9.2  NEUTROABS 3.6  --   --   --   --   --   --  4.2 4.1  HGB 12.5   < > 12.5   < > 13.6  13.6 13.3 13.3 11.9* 12.3  HCT 40.5   < > 38.7   < > 40.0  40.0 39.0 42.1 38.1 38.7  MCV 89.4  --  87.0  --   --   --  87.5 86.8 86.0  PLT 186  --  214  --   --   --  194 193 181   < > = values in this interval not displayed.   Cardiac Enzymes: No results for input(s): CKTOTAL, CKMB, CKMBINDEX, TROPONINI in the last 168 hours. BNP: Invalid input(s): POCBNP CBG: Recent Labs  Lab 12/07/19 1213  GLUCAP 145*   D-Dimer No results for input(s): DDIMER in the last 72 hours. Hgb A1c No results for input(s): HGBA1C in the last 72 hours. Lipid Profile No results for input(s): CHOL, HDL, LDLCALC, TRIG, CHOLHDL, LDLDIRECT in the last 72 hours. Thyroid function studies No results for input(s): TSH, T4TOTAL, T3FREE, THYROIDAB in the last 72 hours.  Invalid input(s): FREET3 Anemia work up No results for input(s): VITAMINB12, FOLATE, FERRITIN, TIBC, IRON, RETICCTPCT in the last 72 hours. Urinalysis    Component Value Date/Time   COLORURINE STRAW (A) 12/06/2019 1716   APPEARANCEUR CLEAR 12/06/2019 1716   LABSPEC 1.006 12/06/2019 1716   PHURINE 5.0 12/06/2019 1716   GLUCOSEU NEGATIVE 12/06/2019 1716   HGBUR NEGATIVE 12/06/2019 1716   BILIRUBINUR NEGATIVE 12/06/2019 1716   KETONESUR NEGATIVE  12/06/2019 1716   PROTEINUR 30 (A) 12/06/2019 1716   NITRITE NEGATIVE 12/06/2019 1716   LEUKOCYTESUR TRACE (A) 12/06/2019 1716   Sepsis Labs Invalid input(s): PROCALCITONIN,  WBC,  LACTICIDVEN Microbiology Recent Results (from the past 240 hour(s))  Respiratory Panel by RT PCR (Flu A&B, Covid) - Nasopharyngeal Swab      Status: None   Collection Time: 12/06/19  2:00 PM   Specimen: Nasopharyngeal Swab; Nasopharyngeal(NP) swabs in vial transport medium  Result Value Ref Range Status   SARS Coronavirus 2 by RT PCR NEGATIVE NEGATIVE Final    Comment: (NOTE) SARS-CoV-2 target nucleic acids are NOT DETECTED.  The SARS-CoV-2 RNA is generally detectable in upper respiratoy specimens during the acute phase of infection. The lowest concentration of SARS-CoV-2 viral copies this assay can detect is 131 copies/mL. A negative result does not preclude SARS-Cov-2 infection and should not be used as the sole basis for treatment or other patient management decisions. A negative result may occur with  improper specimen collection/handling, submission of specimen other than nasopharyngeal swab, presence of viral mutation(s) within the areas targeted by this assay, and inadequate number of viral copies (<131 copies/mL). A negative result must be combined with clinical observations, patient history, and epidemiological information. The expected result is Negative.  Fact Sheet for Patients:  https://www.moore.com/  Fact Sheet for Healthcare Providers:  https://www.young.biz/  This test is no t yet approved or cleared by the [place] FDA and  has been authorized for detection and/or diagnosis of SARS-CoV-2 by FDA under an Emergency Use Authorization (EUA). This EUA will remain  in effect (meaning this test can be used) for the duration of the COVID-19 declaration under Section 564(b)(1) of the Act, 21 U.S.C. section 360bbb-3(b)(1), unless the authorization is terminated or revoked sooner.     Influenza A by PCR NEGATIVE NEGATIVE Final   Influenza B by PCR NEGATIVE NEGATIVE Final    Comment: (NOTE) The Xpert Xpress SARS-CoV-2/FLU/RSV assay is intended as an aid in  the diagnosis of influenza from Nasopharyngeal swab specimens and  should not be used as a sole basis for  treatment. Nasal washings and  aspirates are unacceptable for Xpert Xpress SARS-CoV-2/FLU/RSV  testing.  Fact Sheet for Patients: https://www.moore.com/  Fact Sheet for Healthcare Providers: https://www.young.biz/  This test is not yet approved or cleared by the [place] FDA and  has been authorized for detection and/or diagnosis of SARS-CoV-2 by  FDA under an Emergency Use Authorization (EUA). This EUA will remain  in effect (meaning this test can be used) for the duration of the  Covid-19 declaration under Section 564(b)(1) of the Act, 21  U.S.C. section 360bbb-3(b)(1), unless the authorization is  terminated or revoked. Performed at [facility], [address] [place], [place] [address]     Procedures/Studies: CARDIAC CATHETERIZATION  Result Date: 12/08/2019 Mild - moderate right heart pressure elevation with mild pulmonary hypertension (mean PA 32 mmHg). Normal coronary arteries. Echo documentation of EF at 25 to 30%. 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  Smoking cessation.  DG Chest Portable 1 View  Result Date: 12/06/2019 CLINICAL DATA:  Shortness of breath, chest pain EXAM: PORTABLE CHEST 1 VIEW COMPARISON:  None. FINDINGS: Cardiomegaly. Atherosclerotic calcification of the aortic knob. Pulmonary vascular congestion. Increased interstitial markings throughout both lungs with patchy bibasilar opacities. No appreciable pleural fluid collection. No pneumothorax. IMPRESSION: Cardiomegaly with pulmonary vascular congestion and interstitial edema. Patchy bibasilar  opacities may reflect alveolar edema versus infection. Electronically Signed   By: Duanne Guess D.O.   On: 12/06/2019 14:03   ECHOCARDIOGRAM COMPLETE  Result Date: 12/06/2019    ECHOCARDIOGRAM REPORT   Patient Name:   PAYSLEY POPLAR Date of Exam:  12/06/2019 Medical Rec #:  [id number]    Height: Accession #:    [id number]   Weight: Date of Birth:  06-Aug-1950    BSA: Patient Age:    69 years     BP:           155/96 mmHg Patient Gender: F            HR:           102 bpm. Exam Location:  Inpatient Procedure: 2D Echo Indications:    congestive heart failure  History:        Patient has prior history of Echocardiogram examinations. COPD,                 Mitral Valve Disease; Signs/Symptoms:Dyspnea.  Sonographer:    Delcie Roch Referring Phys: [id number] RONDELL A SMITH IMPRESSIONS  1. Left ventricular ejection fraction, by estimation, is 25 to 30%. The left ventricle has severely decreased function. The left ventricle demonstrates global hypokinesis. There is mild left ventricular hypertrophy. Left ventricular diastolic parameters  are consistent with Grade II diastolic dysfunction (pseudonormalization). Elevated left atrial pressure.  2. Right ventricular systolic function is normal. The right ventricular size is normal. There is moderately elevated pulmonary artery systolic pressure. The estimated right ventricular systolic pressure is 59.5 mmHg.  3. Left atrial size was moderately dilated.  4. Right atrial size was moderately dilated.  5. Moderate pleural effusion in the left lateral region.  6. The mitral valve is degenerative. Moderate to severe mitral valve regurgitation. No evidence of mitral stenosis.  7. Tricuspid valve regurgitation is severe.  8. The aortic valve is normal in structure. Aortic valve regurgitation is not visualized. No aortic stenosis is present.  9. The inferior vena cava is normal in size with greater than 50% respiratory variability, suggesting right atrial pressure of 3 mmHg. FINDINGS  Left Ventricle: Left ventricular ejection fraction, by estimation, is 25 to 30%. The left ventricle has severely decreased function. The left ventricle demonstrates global hypokinesis. The left ventricular internal cavity size was normal in size.  There is mild left ventricular hypertrophy. Left ventricular diastolic parameters are consistent with Grade II diastolic dysfunction (pseudonormalization). Elevated left atrial pressure. Right Ventricle: The right ventricular size is normal. No increase in right ventricular wall thickness. Right ventricular systolic function is normal. There is moderately elevated pulmonary artery systolic pressure. The tricuspid regurgitant velocity is 3.69 m/s, and with an assumed right atrial pressure of 5 mmHg, the estimated right ventricular systolic pressure is 59.5 mmHg. Left Atrium: Left atrial size was moderately dilated. Right Atrium: Right atrial size was moderately dilated. Pericardium: There is no evidence of pericardial effusion. Mitral Valve: The mitral valve is degenerative in appearance. There is moderate thickening of the mitral valve leaflet(s). Moderate to severe mitral valve regurgitation, with posteriorly-directed jet. No evidence of mitral valve stenosis. Tricuspid Valve: The tricuspid valve is normal in structure. Tricuspid valve regurgitation is severe. No evidence of tricuspid stenosis. Aortic Valve: The aortic valve is normal in structure. Aortic valve regurgitation is not visualized. No aortic stenosis is present. Pulmonic Valve: The pulmonic valve was normal in structure. Pulmonic valve regurgitation is mild. No evidence of pulmonic stenosis. Aorta: The aortic root is normal in  size and structure. Venous: The inferior vena cava is normal in size with greater than 50% respiratory variability, suggesting right atrial pressure of 3 mmHg. IAS/Shunts: No atrial level shunt detected by color flow Doppler. Additional Comments: There is a moderate pleural effusion in the left lateral region.  LEFT VENTRICLE PLAX 2D LVIDd:         5.10 cm     Diastology LVIDs:         4.40 cm     LV e' medial:    7.94 cm/s LV PW:         1.20 cm     LV E/e' medial:  15.9 LV IVS:        1.00 cm     LV e' lateral:   6.85 cm/s LVOT  diam:     1.70 cm     LV E/e' lateral: 18.4 LV SV:         29 LVOT Area:     2.27 cm  LV Volumes (MOD) LV vol d, MOD A4C: 97.9 ml LV vol s, MOD A4C: 69.6 ml LV SV MOD A4C:     97.9 ml RIGHT VENTRICLE            IVC RV S prime:     8.38 cm/s  IVC diam: 2.00 cm TAPSE (M-mode): 1.1 cm LEFT ATRIUM             RIGHT ATRIUM LA diam:        4.90 cm RA Area:     17.90 cm LA Vol (A2C):   74.2 ml RA Volume:   47.60 ml LA Vol (A4C):   86.5 ml LA Biplane Vol: 86.3 ml  AORTIC VALVE LVOT Vmax:   93.30 cm/s LVOT Vmean:  57.600 cm/s LVOT VTI:    0.129 m  AORTA Ao Root diam: 3.00 cm Ao Asc diam:  3.10 cm MITRAL VALVE                 TRICUSPID VALVE MV Area (PHT): 6.32 cm      TR Peak grad:   54.5 mmHg MV Decel Time: 120 msec      TR Vmax:        369.00 cm/s MR Peak grad:    117.9 mmHg MR Mean grad:    73.0 mmHg   SHUNTS MR Vmax:         543.00 cm/s Systemic VTI:  0.13 m MR Vmean:        403.0 cm/s  Systemic Diam: 1.70 cm MR PISA:         1.01 cm MR PISA Eff ROA: 7 mm MR PISA Radius:  0.40 cm MV E velocity: 126.00 cm/s MV A velocity: 47.60 cm/s MV E/A ratio:  2.65 Donato Schultz MD Electronically signed by Donato Schultz MD Signature Date/Time: 12/06/2019/6:08:48 PM    Final    ECHO TEE  Result Date: 12/09/2019    TRANSESOPHOGEAL ECHO REPORT   Patient Name:   [name] Date of Exam: 12/09/2019 Medical Rec #:  [id number]    Height:       64.0 in Accession #:    [id number]   Weight:       145.7 lb Date of Birth:  1950/06/17    BSA:          1.710 m Patient Age:    69 years     BP:           117/71 mmHg Patient Gender: F  HR:           92 bpm. Exam Location:  Inpatient Procedure: Transesophageal Echo Indications:    Mitral regurgitation  History:        Patient has prior history of Echocardiogram examinations, most                 recent 12/06/2019. COPD; Risk Factors:Current Smoker and ETOH.  Sonographer:    Eulah Pont RDCS Referring Phys: [id number] DAYNA N DUNN PROCEDURE: After discussion of the risks and benefits of a  TEE, an informed consent was obtained from the patient. The transesophogeal probe was passed without difficulty through the esophogus of the patient. Local oropharyngeal anesthetic was provided with Cetacaine. Sedation performed by different physician. The patient was monitored while under deep sedation. Anesthestetic sedation was provided intravenously by Anesthesiology: 152.37mg  of Propofol, 100mg  of Lidocaine. The patient's vital signs; including heart rate, blood pressure, and oxygen saturation; remained stable throughout the procedure. The patient developed no complications during the procedure. IMPRESSIONS  1. Left ventricular ejection fraction, by estimation, is 25 to 30%. The left ventricle has severely decreased function. The left ventricle demonstrates global hypokinesis. There is mild left ventricular hypertrophy. Left ventricular diastolic function could not be evaluated.  2. Right ventricular systolic function is normal. The right ventricular size is normal.  3. Left atrial size was moderately dilated. No left atrial/left atrial appendage thrombus was detected.  4. Right atrial size was moderately dilated.  5. The pericardial effusion is circumferential.  6. Mild mitral subvalvular thickening/fibrosis.  7. The mitral valve is myxomatous. Severe mitral valve regurgitation.  8. The tricuspid valve is abnormal. Tricuspid valve regurgitation is moderate.  9. The aortic valve is tricuspid. Aortic valve regurgitation is not visualized. 10. There is mild (Grade II) layered plaque involving the transverse and descending aorta. Comparison(s): A prior study was performed on 12/06/2019. LVEF unchanged at 25-30%, severe MR. Conclusion(s)/Recommendation(s): Recommend evaluation by structural heart team for possible mitraclip. FINDINGS  Left Ventricle: Left ventricular ejection fraction, by estimation, is 25 to 30%. The left ventricle has severely decreased function. The left ventricle demonstrates global  hypokinesis. The left ventricular internal cavity size was normal in size. There is mild left ventricular hypertrophy. Left ventricular diastolic function could not be evaluated. Right Ventricle: The right ventricular size is normal. No increase in right ventricular wall thickness. Right ventricular systolic function is normal. Left Atrium: Left atrial size was moderately dilated. Spontaneous echo contrast was present in the left atrium. No left atrial/left atrial appendage thrombus was detected. Right Atrium: Right atrial size was moderately dilated. Pericardium: Trivial pericardial effusion is present. The pericardial effusion is circumferential. Mitral Valve: The mitral valve is myxomatous. There is moderate thickening of the mitral valve leaflet(s). Moderately decreased mobility of the mitral valve leaflets. Mild mitral subvalvular thickening/fibrosis. There is mild chordal rupture of the posterior leaflet of the mitral valve. Severe mitral valve regurgitation, with eccentric posteriorly directed jet. Tricuspid Valve: The tricuspid valve is abnormal. Tricuspid valve regurgitation is moderate. Aortic Valve: The aortic valve is tricuspid. Aortic valve regurgitation is not visualized. Pulmonic Valve: The pulmonic valve was grossly normal. Pulmonic valve regurgitation is mild. Aorta: The aortic root and ascending aorta are structurally normal, with no evidence of dilitation. There is mild (Grade II) layered plaque involving the transverse and descending aorta. Venous: The pulmonary veins were not well visualized. A pattern of systolic flow reversal, suggestive of severe mitral regurgitation is recorded from the left upper pulmonary vein. IAS/Shunts: No atrial  level shunt detected by color flow Doppler.  LEFT VENTRICLE PLAX 2D LVOT diam:     1.70 cm LVOT Area:     2.27 cm  MR Peak grad:    157.0 mmHg MR Mean grad:    92.0 mmHg   SHUNTS MR Vmax:         626.50 cm/s Systemic Diam: 1.70 cm MR Vmean:        438.5 cm/s  MR PISA:         3.08 cm MR PISA Eff ROA: 19 mm MR PISA Radius:  0.70 cm Zoila Shutter MD Electronically signed by Zoila Shutter MD Signature Date/Time: 12/09/2019/3:11:14 PM    Final      Time coordinating discharge: Over 30 minutes    Lewie Chamber, MD  [facility] 12/10/2019, 3:00 PM

## 2019-12-10 NOTE — Plan of Care (Signed)
Problem: Education: Goal: Knowledge of General Education information will improve Description: Including pain rating scale, medication(s)/side effects and non-pharmacologic comfort measures 12/10/2019 1110 by Caswell Corwin, RN Outcome: Adequate for Discharge 12/10/2019 1110 by Caswell Corwin, RN Outcome: Adequate for Discharge   Problem: Health Behavior/Discharge Planning: Goal: Ability to manage health-related needs will improve 12/10/2019 1110 by Caswell Corwin, RN Outcome: Adequate for Discharge 12/10/2019 1110 by Caswell Corwin, RN Outcome: Adequate for Discharge   Problem: Clinical Measurements: Goal: Ability to maintain clinical measurements within normal limits will improve 12/10/2019 1110 by Caswell Corwin, RN Outcome: Adequate for Discharge 12/10/2019 1110 by Caswell Corwin, RN Outcome: Adequate for Discharge Goal: Will remain free from infection 12/10/2019 1110 by Caswell Corwin, RN Outcome: Adequate for Discharge 12/10/2019 1110 by Caswell Corwin, RN Outcome: Adequate for Discharge Goal: Diagnostic test results will improve 12/10/2019 1110 by Caswell Corwin, RN Outcome: Adequate for Discharge 12/10/2019 1110 by Caswell Corwin, RN Outcome: Adequate for Discharge Goal: Respiratory complications will improve 12/10/2019 1110 by Caswell Corwin, RN Outcome: Adequate for Discharge 12/10/2019 1110 by Caswell Corwin, RN Outcome: Adequate for Discharge Goal: Cardiovascular complication will be avoided 12/10/2019 1110 by Caswell Corwin, RN Outcome: Adequate for Discharge 12/10/2019 1110 by Caswell Corwin, RN Outcome: Adequate for Discharge   Problem: Activity: Goal: Risk for activity intolerance will decrease 12/10/2019 1110 by Caswell Corwin, RN Outcome: Adequate for Discharge 12/10/2019 1110 by Caswell Corwin, RN Outcome: Adequate for Discharge   Problem: Nutrition: Goal: Adequate nutrition will be maintained 12/10/2019 1110 by Caswell Corwin, RN Outcome: Adequate for  Discharge 12/10/2019 1110 by Caswell Corwin, RN Outcome: Adequate for Discharge   Problem: Coping: Goal: Level of anxiety will decrease 12/10/2019 1110 by Caswell Corwin, RN Outcome: Adequate for Discharge 12/10/2019 1110 by Caswell Corwin, RN Outcome: Adequate for Discharge   Problem: Elimination: Goal: Will not experience complications related to bowel motility 12/10/2019 1110 by Caswell Corwin, RN Outcome: Adequate for Discharge 12/10/2019 1110 by Caswell Corwin, RN Outcome: Adequate for Discharge Goal: Will not experience complications related to urinary retention 12/10/2019 1110 by Caswell Corwin, RN Outcome: Adequate for Discharge 12/10/2019 1110 by Caswell Corwin, RN Outcome: Adequate for Discharge   Problem: Pain Managment: Goal: General experience of comfort will improve 12/10/2019 1110 by Caswell Corwin, RN Outcome: Adequate for Discharge 12/10/2019 1110 by Caswell Corwin, RN Outcome: Adequate for Discharge   Problem: Safety: Goal: Ability to remain free from injury will improve 12/10/2019 1110 by Caswell Corwin, RN Outcome: Adequate for Discharge 12/10/2019 1110 by Caswell Corwin, RN Outcome: Adequate for Discharge   Problem: Skin Integrity: Goal: Risk for impaired skin integrity will decrease 12/10/2019 1110 by Caswell Corwin, RN Outcome: Adequate for Discharge 12/10/2019 1110 by Caswell Corwin, RN Outcome: Adequate for Discharge   Problem: Education: Goal: Ability to demonstrate management of disease process will improve 12/10/2019 1110 by Caswell Corwin, RN Outcome: Adequate for Discharge 12/10/2019 1110 by Caswell Corwin, RN Outcome: Adequate for Discharge Goal: Ability to verbalize understanding of medication therapies will improve 12/10/2019 1110 by Caswell Corwin, RN Outcome: Adequate for Discharge 12/10/2019 1110 by Caswell Corwin, RN Outcome: Adequate for Discharge Goal: Individualized Educational Video(s) 12/10/2019 1110 by Caswell Corwin, RN Outcome:  Adequate for Discharge 12/10/2019 1110 by Caswell Corwin, RN Outcome: Adequate for Discharge   Problem: Activity: Goal: Capacity to carry out  activities will improve 12/10/2019 1110 by Caswell Corwin, RN Outcome: Adequate for Discharge 12/10/2019 1110 by Caswell Corwin, RN Outcome: Adequate for Discharge   Problem: Cardiac: Goal: Ability to achieve and maintain adequate cardiopulmonary perfusion will improve 12/10/2019 1110 by Caswell Corwin, RN Outcome: Adequate for Discharge 12/10/2019 1110 by Caswell Corwin, RN Outcome: Adequate for Discharge

## 2019-12-13 ENCOUNTER — Encounter (HOSPITAL_COMMUNITY): Payer: Self-pay | Admitting: Internal Medicine

## 2019-12-14 ENCOUNTER — Other Ambulatory Visit: Payer: Self-pay

## 2019-12-14 NOTE — Patient Outreach (Signed)
[facility]) Care Management  12/14/2019  Emma Hahn August 29, 1950 [id number]   Referral Date: 12/14/19 Referral Source: [employer] Report Date of Discharge: 12/10/19 Facility:  [facility] Insurance: [facility]   Referral received.  No outreach warranted at this time.  Transition of Care calls being completed via EMMI. RN CM will outreach patient for any red flags received.    Plan: RN CM will close case.    Bary Leriche, RN, MSN [facility] Care Management Care Management Coordinator Direct Line [phone] Toll Free: [phone]  Fax: [phone]

## 2019-12-15 ENCOUNTER — Institutional Professional Consult (permissible substitution): Payer: Medicare HMO | Admitting: Pulmonary Disease

## 2019-12-21 ENCOUNTER — Institutional Professional Consult (permissible substitution): Payer: Medicare HMO | Admitting: Cardiovascular Disease

## 2020-01-05 ENCOUNTER — Other Ambulatory Visit: Payer: Self-pay

## 2020-01-05 ENCOUNTER — Encounter: Payer: Self-pay | Admitting: Cardiovascular Disease

## 2020-01-05 ENCOUNTER — Ambulatory Visit: Payer: Medicare HMO | Admitting: Cardiovascular Disease

## 2020-01-05 VITALS — BP 120/70 | HR 82 | Ht 64.0 in | Wt 144.0 lb

## 2020-01-05 DIAGNOSIS — I340 Nonrheumatic mitral (valve) insufficiency: Secondary | ICD-10-CM

## 2020-01-05 DIAGNOSIS — I5022 Chronic systolic (congestive) heart failure: Secondary | ICD-10-CM

## 2020-01-05 MED ORDER — ENTRESTO 24-26 MG PO TABS: 1.0000 | ORAL_TABLET | Freq: Two times a day (BID) | ORAL | 11 refills | Status: DC

## 2020-01-05 NOTE — Patient Instructions (Signed)
Medication Instructions:  1) START ENTRESTO 24-46 mg twice daily *If you need a refill on your cardiac medications before your next appointment, please call your pharmacy*  Lab Work: Your provider recommends that you return for lab work in 2-3 weeks. If you have labs (blood work) drawn today and your tests are completely normal, you will receive your results only by: Marland Kitchen MyChart Message (if you have MyChart) OR . A paper copy in the mail If you have any lab test that is abnormal or we need to change your treatment, we will call you to review the results.  Testing/Procedures: Your physician has requested that you have an echocardiogram. Echocardiography is a painless test that uses sound waves to create images of your heart. It provides your doctor with information about the size and shape of your heart and how well your heart's chambers and valves are working. This procedure takes approximately one hour. There are no restrictions for this procedure.  Follow-Up: You have been referred to the [facility].

## 2020-01-05 NOTE — Progress Notes (Signed)
Cardiology Office Note:    Date:  01/05/2020   ID:  Emma Hahn, DOB 25-Sep-1950, MRN [id number]  PCP:  Emma Plum, MD  [facility] Cardiologist:  [name], MD  [facility] Electrophysiologist:  None   Referring MD: Emma Plum, MD   Chief Complaint  Patient presents with  . Mitral Regurgitation    History of Present Illness:    Emma Hahn is a 69 y.o. female referred for evaluation of severe mitral regurgitation by Dr. [name].  The patient has a history of alcohol and tobacco use with COPD.  She developed dyspnea and chest pressure in May 2021.  She reportedly had an echocardiogram performed with recommendations to have outpatient cardiology evaluation.  The patient declined any further assessment at that time.  She went on to develop worsening shortness of breath and chest pressure, prompting hospitalization in November 2021.  At that time she was found to have severe LV dysfunction with LVEF 25 to 30% as well as severe mitral and tricuspid regurgitation.  She ultimately underwent right and left heart catheterization demonstrating patent coronary arteries with no obstructive CAD.  She had mild pulmonary hypertension with a mean PA pressure of 32 mmHg likely related to left heart disease with pulmonary capillary wedge pressure of 27 mmHg.  She was noted to have low cardiac output and index by the Fick method, with an output of 2.5 L/min and an index of 1.5 L/min/m.  Medical therapy was optimized and she presents today for outpatient evaluation for consideration of edge-to-edge mitral valve repair.  Blood pressure was felt to be too low to start an ACE or ARB.  She has been treated with furosemide and metoprolol succinate.  She is here alone today. States that she has been feeling much better since discharge from the hospital.  She continues to have occasional chest pain but the symptoms are fleeting.  Orthopnea has resolved.  She is not short of  breath with her normal activities such as climbing 1 flight of stairs.  She reports good compliance with her medications. She had no medical care for over 20 years prior to being hospitalized in November 2021. She had a remote hysterectomy but otherwise hasn't had any surgeries or required hospitalization. She had never been told of a heart murmur in the past.   She admits to chronic cough but this is better since she's been taking diuretics. No fevers, chills, or body aches. No history of rheumatic fever as a child. She still smokes but has cut back to a pack of cigarettes over 3 days. She is also drinking alcohol and doesn't want to quantify how much she is drinking - states it depends on how she is doing.   Past Medical History:  Diagnosis Date  . Borderline diabetes   . COPD (chronic obstructive pulmonary disease) (HCC)   . Habitual alcohol use   . Tobacco abuse     Past Surgical History:  Procedure Laterality Date  . RIGHT/LEFT HEART CATH AND CORONARY ANGIOGRAPHY N/A 12/08/2019   Procedure: RIGHT/LEFT HEART CATH AND CORONARY ANGIOGRAPHY;  Surgeon: Lennette Bihari, MD;  Location: [facility] INVASIVE CV LAB;  Service: Cardiovascular;  Laterality: N/A;  . TEE WITHOUT CARDIOVERSION N/A 12/09/2019   Procedure: TRANSESOPHAGEAL ECHOCARDIOGRAM (TEE);  Surgeon: Chrystie Nose, MD;  Location: [facility] ENDOSCOPY;  Service: Cardiovascular;  Laterality: N/A;  . TOTAL ABDOMINAL HYSTERECTOMY      Current Medications: Current Meds  Medication Sig  . albuterol (VENTOLIN HFA) 108 (90 Base)  MCG/ACT inhaler Inhale 1-2 puffs into the lungs every 6 (six) hours as needed for wheezing or shortness of breath.  Marland Kitchen aspirin EC 81 MG EC tablet Take 1 tablet (81 mg total) by mouth daily. Swallow whole.  Marland Kitchen atorvastatin (LIPITOR) 40 MG tablet Take 1 tablet (40 mg total) by mouth daily.  . furosemide (LASIX) 40 MG tablet Take 1 tablet (40 mg total) by mouth 2 (two) times daily.  . metoprolol succinate (TOPROL-XL) 25 MG 24 hr  tablet Take 1 tablet (25 mg total) by mouth daily.  . nitroGLYCERIN (NITROSTAT) 0.4 MG SL tablet Place 0.4 mg under the tongue as needed for chest pain.     Allergies:   Patient has no known allergies.   Social History   Socioeconomic History  . Marital status: Single    Spouse name: Not on file  . Number of children: Not on file  . Years of education: Not on file  . Highest education level: Not on file  Occupational History  . Not on file  Tobacco Use  . Smoking status: Current Every Day Smoker    Types: Cigarettes  . Smokeless tobacco: Never Used  Substance and Sexual Activity  . Alcohol use: Yes    Comment: Usually drinks vodka or gin or beer, 3 days out of the week  . Drug use: Never  . Sexual activity: Not on file  Other Topics Concern  . Not on file  Social History Narrative  . Not on file   Social Determinants of Health   Financial Resource Strain: Not on file  Food Insecurity: Not on file  Transportation Needs: Not on file  Physical Activity: Not on file  Stress: Not on file  Social Connections: Not on file     Family History: [name] history includes Stroke in her maternal grandmother.  ROS:   Please see the history of present illness.    Positive for chronic low back pain, bilateral knee pain, leg/foot cramps. All other systems reviewed and are negative.  EKGs/Labs/Other Studies Reviewed:    The following studies were reviewed today: TEE 12/25/19: IMPRESSIONS    1. Left ventricular ejection fraction, by estimation, is 25 to 30%. The  left ventricle has severely decreased function. The left ventricle  demonstrates global hypokinesis. There is mild left ventricular  hypertrophy. Left ventricular diastolic function  could not be evaluated.  2. Right ventricular systolic function is normal. The right ventricular  size is normal.  3. Left atrial size was moderately dilated. No left atrial/left atrial  appendage thrombus was  detected.  4. Right atrial size was moderately dilated.  5. The pericardial effusion is circumferential.  6. Mild mitral subvalvular thickening/fibrosis.  7. The mitral valve is myxomatous. Severe mitral valve regurgitation.  8. The tricuspid valve is abnormal. Tricuspid valve regurgitation is  moderate.  9. The aortic valve is tricuspid. Aortic valve regurgitation is not  visualized.  10. There is mild (Grade II) layered plaque involving the transverse and  descending aorta.   Comparison(s): A prior study was performed on 12/06/2019. LVEF unchanged  at 25-30%, severe MR.   Conclusion(s)/Recommendation(s): Recommend evaluation by structural heart  team for possible mitraclip.   Echo 12/06/2019: IMPRESSIONS    1. Left ventricular ejection fraction, by estimation, is 25 to 30%. The  left ventricle has severely decreased function. The left ventricle  demonstrates global hypokinesis. There is mild left ventricular  hypertrophy. Left ventricular diastolic parameters  are consistent with Grade II diastolic dysfunction (  pseudonormalization).  Elevated left atrial pressure.  2. Right ventricular systolic function is normal. The right ventricular  size is normal. There is moderately elevated pulmonary artery systolic  pressure. The estimated right ventricular systolic pressure is 59.5 mmHg.  3. Left atrial size was moderately dilated.  4. Right atrial size was moderately dilated.  5. Moderate pleural effusion in the left lateral region.  6. The mitral valve is degenerative. Moderate to severe mitral valve  regurgitation. No evidence of mitral stenosis.  7. Tricuspid valve regurgitation is severe.  8. The aortic valve is normal in structure. Aortic valve regurgitation is  not visualized. No aortic stenosis is present.  9. The inferior vena cava is normal in size with greater than 50%  respiratory variability, suggesting right atrial pressure of 3 mmHg.   Cardiac  Cath: Conclusion  Mild - moderate right heart pressure elevation with mild pulmonary hypertension (mean PA 32 mmHg).  Normal coronary arteries.  Echo documentation of EF at 25 to 30%.  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  Smoking cessation.  Right Heart  Right Heart Pressures RA: Weight 12, V wave 15, mean 13 RV: 49/14 PA: 44/23; mean 32 PW: a 23; V 33 mean 27 AO: 108/73 LV: 104/20  By the Fick method, cardiac output 2.5 L/min and cardiac index 1.5 L/min/m  PVR: 2.0 WU    Coronary Diagrams   Diagnostic Dominance: Co-dominant    EKG:  EKG is not ordered today.    Recent Labs: 12/06/2019: ALT 16 12/07/2019: TSH 0.509 12/08/2019: B Natriuretic Peptide 1,693.0 12/10/2019: BUN 28; Creatinine, Ser 1.20; Hemoglobin 12.3; Magnesium 1.9; Platelets 181; Potassium 4.0; Sodium 137  Recent Lipid Panel    Component Value Date/Time   CHOL 213 (H) 12/07/2019 0433   TRIG 45 12/07/2019 0433   HDL 61 12/07/2019 0433   CHOLHDL 3.5 12/07/2019 0433   VLDL 9 12/07/2019 0433   LDLCALC 143 (H) 12/07/2019 0433     Risk Assessment/Calculations:       Physical Exam:    VS:  BP 120/70   Pulse 82   Ht 5\' 4"  (1.626 m)   Wt 144 lb (65.3 kg)   SpO2 99%   BMI 24.72 kg/m     Wt Readings from Last 3 Encounters:  01/05/20 144 lb (65.3 kg)  12/10/19 147 lb 0.8 oz (66.7 kg)     GEN:  Well nourished, well developed in no acute distress HEENT: Normal NECK: No JVD; No carotid bruits LYMPHATICS: No lymphadenopathy CARDIAC: RRR, soft systolic murmur at the left axilla RESPIRATORY:  Clear to auscultation without rales, wheezing or rhonchi  ABDOMEN: Soft, non-tender, non-distended MUSCULOSKELETAL:  No edema; No deformity  SKIN: Warm and dry NEUROLOGIC:  Alert and oriented x 3 PSYCHIATRIC:  Normal affect    ASSESSMENT:    1. Chronic systolic  heart failure (HCC)   2. Nonrheumatic mitral (valve) insufficiency    PLAN:    In order of problems listed above:  1. The patient has New York Heart Association functional class II symptoms at present.  She seems to be tolerating low-dose metoprolol succinate and furosemide with improvement in her symptoms since she was hospitalized last month.  I discussed the natural history of severe mitral valve insufficiency in the setting of chronic systolic heart failure with the patient at length today.  We reviewed available trial data regarding surgical or transcatheter mitral valve repair with respect  to functional class, risk of recurrent hospitalization for heart failure, and survival.  We also discussed the importance of medical therapy and lifestyle modification.  The patient has nonischemic cardiomyopathy without severe LV dilatation.  Her LV end-diastolic diameter is 5.1 cm.  She does have TEE findings consistent with severe mitral regurgitation with a broad-based regurgitant jet secondary to posterior leaflet restriction and annular dilatation.  The mitral valve leaflets also have some degree of thickening.  There is no evidence of mitral stenosis or rheumatic mitral valve disease.  Cardiac catheterization demonstrates widely patent coronary arteries without any obstructive disease.  Hemodynamic findings are outlined above with the presence of mild pulmonary hypertension, moderately elevated pulmonary capillary wedge pressure, and low cardiac output.  She is clearly demonstrating clinical improvement since she was hospitalized last month.  It seems reasonable to further titrate her heart failure regimen at this point.  I have recommended the addition of Entresto 24/26 mg twice daily.  She will continue on metoprolol succinate and furosemide.  Will arrange labs in a few weeks.  We will refer her for advanced heart failure consultation as part of a multidisciplinary heart team approach to her care as we are  considering transcatheter edge-to-edge mitral repair.  I also reviewed the importance of complete alcohol cessation with the patient as this could be contributing to her cardiomyopathy.  We will tentatively set up an echo in 3 to 4 months and follow-up visit to continue evaluation for transcatheter edge-to-edge mitral valve repair with MitraClip.  I reviewed the MitraClip procedure with the patient today, including procedural steps with a specific discussion about potential complications and expected recovery.  Will review this in more detail with her when she returns for follow-up evaluation pending her echocardiogram findings.  Medication Adjustments/Labs and Tests Ordered: Current medicines are reviewed at length with the patient today.  Concerns regarding medicines are outlined above.  Orders Placed This Encounter  Procedures  . Basic metabolic panel  . AMB referral to CHF clinic  . ECHOCARDIOGRAM COMPLETE   Meds ordered this encounter  Medications  . sacubitril-valsartan (ENTRESTO) 24-26 MG    Sig: Take 1 tablet by mouth 2 (two) times daily.    Dispense:  60 tablet    Refill:  11    Please Honor Card patient is presenting for Cecelia Byars: [id number]; Alvira Philips: [id number]; RXPCN: [id number]; RXID: [id number]    Patient Instructions  Medication Instructions:  1) START ENTRESTO 24-46 mg twice daily *If you need a refill on your cardiac medications before your next appointment, please call your pharmacy*  Lab Work: Your provider recommends that you return for lab work in 2-3 weeks. If you have labs (blood work) drawn today and your tests are completely normal, you will receive your results only by: Marland Kitchen MyChart Message (if you have MyChart) OR . A paper copy in the mail If you have any lab test that is abnormal or we need to change your treatment, we will call you to review the results.  Testing/Procedures: Your physician has requested that you have an echocardiogram. Echocardiography is a  painless test that uses sound waves to create images of your heart. It provides your doctor with information about the size and shape of your heart and how well your heart's chambers and valves are working. This procedure takes approximately one hour. There are no restrictions for this procedure.  Follow-Up: You have been referred to the [facility].    Signed, Tonny Bollman, MD  01/05/2020 3:35  PM    [facility]

## 2020-01-11 ENCOUNTER — Telehealth: Payer: Self-pay | Admitting: Cardiovascular Disease

## 2020-01-11 NOTE — Telephone Encounter (Signed)
Routing to [place] [address] to assist.

## 2020-01-11 NOTE — Telephone Encounter (Signed)
Pt c/o medication issue:  1. Name of Medication: sacubitril-valsartan (ENTRESTO) 24-26 MG  2. How are you currently taking this medication (dosage and times per day)? As prescribed.  3. Are you having a reaction (difficulty breathing--STAT)? No.  4. What is your medication issue? Patient received sacubitril-valsartan (ENTRESTO) 24-26 MG  today and states that she sent it back to [facility] because she cant afford it. Please advise.

## 2020-01-12 ENCOUNTER — Other Ambulatory Visit: Payer: Self-pay

## 2020-01-12 MED ORDER — ENTRESTO 24-26 MG PO TABS: 1.0000 | ORAL_TABLET | Freq: Two times a day (BID) | ORAL | 0 refills | Status: DC

## 2020-01-12 NOTE — Telephone Encounter (Signed)
**Note De-Identified Ovida Delagarza Obfuscation** The pts Entresto RX was e-scribed to [employer] and should have been sent to the pts local pharmacy as the free co-pay card is for a 30 day supply. Mail pharmacy does not honor as they fill 90 day supply only.  The pt is aware that I am e-scribing her Sherryll Burger RX to [employer] for #60 with no refills as she wants to use [employer] going forward. I did advise her to call [facility] pharmacy prior to picking up to be sure the 30 day free co-pay card worked and if not she is aware to call us back.   We also discussed pt asst through [employer] for [place] and she states that she is going to call them see if she qualifies for the program and if so she will ask them to mail her an application to her home. She is aware to complete her part of the application, obtain required documents per [employer] pt asst [facility] and to drop all off at Dr Randolm Idol office and that we will handle the provider page of the application and will fax all to [employer] Pt Asst [facility].   She thanked me for calling her back.

## 2020-01-18 NOTE — Progress Notes (Signed)
Emma Hahn DOB [birth year]  This looks like a clippable valve. The fossa looks approachable for transseptal puncture in both the SAXB and Bicaval views. LA dimensions are large enough for device steering and straddle. The MR jet is broad based and centrally located. The posterior leaflet measures around 1.4 cm in the 120 LVOT grasping view. The chords appear to be calcified and that could affect clip placement and gradient. I would like to verify both MVA and gradient in the case prior to start.  Based on this information, I'd recommend starting with an XTW and assessing for gradient.

## 2020-01-19 ENCOUNTER — Institutional Professional Consult (permissible substitution): Payer: Medicare HMO | Admitting: Pulmonary Disease

## 2020-01-28 ENCOUNTER — Other Ambulatory Visit: Payer: Self-pay

## 2020-01-28 ENCOUNTER — Other Ambulatory Visit: Payer: Medicare HMO | Admitting: *Deleted

## 2020-01-28 DIAGNOSIS — I5022 Chronic systolic (congestive) heart failure: Secondary | ICD-10-CM

## 2020-01-28 DIAGNOSIS — I340 Nonrheumatic mitral (valve) insufficiency: Secondary | ICD-10-CM

## 2020-01-29 LAB — BASIC METABOLIC PANEL
BUN/Creatinine Ratio: 16 (ref 12–28)
BUN: 14 mg/dL (ref 8–27)
CO2: 27 mmol/L (ref 20–29)
Calcium: 9.2 mg/dL (ref 8.7–10.3)
Chloride: 98 mmol/L (ref 96–106)
Creatinine, Ser: 0.86 mg/dL (ref 0.57–1.00)
GFR calc Af Amer: 79 mL/min/{1.73_m2} (ref >59)
GFR calc non Af Amer: 69 mL/min/{1.73_m2} (ref >59)
Glucose: 129 mg/dL — ABNORMAL HIGH (ref 65–99)
Potassium: 3.3 mmol/L — ABNORMAL LOW (ref 3.5–5.2)
Sodium: 139 mmol/L (ref 134–144)

## 2020-02-01 ENCOUNTER — Telehealth: Payer: Self-pay | Admitting: *Deleted

## 2020-02-01 DIAGNOSIS — I5022 Chronic systolic (congestive) heart failure: Secondary | ICD-10-CM

## 2020-02-01 DIAGNOSIS — Z79899 Other long term (current) drug therapy: Secondary | ICD-10-CM

## 2020-02-01 MED ORDER — SPIRONOLACTONE 25 MG PO TABS: 12.5000 mg | ORAL_TABLET | Freq: Every day | ORAL | 3 refills | Status: DC

## 2020-02-01 NOTE — Telephone Encounter (Signed)
I spoke with patient and reviewed lab results and recommendations from Dr [name] with her.  She thinks she will get to pharmacy tomorrow to pick up spironolactone and will start tomorrow.  She will come in for lab work on 02/09/19.  Patient will let us know if she is unable to get to pharmacy tomorrow so lab work can be rescheduled to one week from start date.  Prescription sent to [facility] [address] [facility] Current weight is 147 lbs.  Reports on 01/28/19 she was 149 lbs.  Normal weight is 144 lbs per patient.  She will let us know if she gains 3 lbs over night or 5 lbs in a week.

## 2020-02-09 ENCOUNTER — Other Ambulatory Visit: Payer: Medicare HMO

## 2020-02-10 ENCOUNTER — Other Ambulatory Visit: Payer: Medicare HMO

## 2020-02-19 ENCOUNTER — Other Ambulatory Visit: Payer: Medicare HMO | Admitting: *Deleted

## 2020-02-19 ENCOUNTER — Other Ambulatory Visit: Payer: Self-pay

## 2020-02-19 DIAGNOSIS — Z79899 Other long term (current) drug therapy: Secondary | ICD-10-CM | POA: Diagnosis not present

## 2020-02-19 DIAGNOSIS — I5022 Chronic systolic (congestive) heart failure: Secondary | ICD-10-CM | POA: Diagnosis not present

## 2020-02-19 LAB — BASIC METABOLIC PANEL
BUN/Creatinine Ratio: 15 (ref 12–28)
BUN: 14 mg/dL (ref 8–27)
CO2: 29 mmol/L (ref 20–29)
Calcium: 9.5 mg/dL (ref 8.7–10.3)
Chloride: 99 mmol/L (ref 96–106)
Creatinine, Ser: 0.91 mg/dL (ref 0.57–1.00)
GFR calc Af Amer: 74 mL/min/{1.73_m2} (ref >59)
GFR calc non Af Amer: 64 mL/min/{1.73_m2} (ref >59)
Glucose: 180 mg/dL — ABNORMAL HIGH (ref 65–99)
Potassium: 3.6 mmol/L (ref 3.5–5.2)
Sodium: 138 mmol/L (ref 134–144)

## 2020-02-22 ENCOUNTER — Inpatient Hospital Stay (HOSPITAL_COMMUNITY)
Admission: RE | Admit: 2020-02-22 | Discharge: 2020-02-22 | Disposition: A | Discharge status: Home / Self Care | Payer: Medicare HMO | Source: Ambulatory Visit | Attending: Internal Medicine | Admitting: Internal Medicine

## 2020-02-22 DIAGNOSIS — I340 Nonrheumatic mitral (valve) insufficiency: Secondary | ICD-10-CM

## 2020-02-22 NOTE — Progress Notes (Signed)
Patient did not show for appt. Not left for templating purposes.      ADVANCED HF CLINIC CONSULT NOTE  Referring Physician: Dr. [name] Primary Care: Norm Salt, [place] Primary Cardiologist: [name], MD  HPI:  Emma Hahn is a 70 y.o. female referred for evaluation of severe mitral regurgitation and heart failure by Dr. [name]. She has been followed by Dr. [name] and was recently referred to Dr. [name] for consideration of a MitraClip for her severe MR.    The patient has a history of alcohol and tobacco use with COPD.  She developed dyspnea and chest pressure in May 2021.  She reportedly had an echocardiogram performed with recommendations to have outpatient cardiology evaluation.  The patient declined any further assessment at that time.  She went on to develop worsening shortness of breath and chest pressure, prompting hospitalization in November 2021.  At that time she was found to have severe LV dysfunction with LVEF 25 to 30% as well as severe mitral and tricuspid regurgitation.  She ultimately underwent right and left heart catheterization on 12/08/19 demonstrating patent coronary arteries with no obstructive CAD.  She had mild pulmonary hypertension with a mean PA pressure of 32 mmHg likely related to left heart disease with pulmonary capillary wedge pressure of 27 mmHg with a v-wave of 33.  She was noted to have low cardiac output and index by the Fick method, with an output of 2.5 L/min and an index of 1.5 L/min/m.  Medical therapy has been titrated but limited by low BP. She is now on Entresto 24/26 bid, spiro 12.5 daily and Toprol 25 daily. She takes lasix 40 bid.    TEE 12/09/19 EF 25-30% predominantly central MR with mild posterior component due to tethering of posterior MV leaflet   Her symptoms are much improved with medical therapy She reports good compliance with her medications. She had no medical care for over 20 years prior to being  hospitalized in November 2021. She had a remote hysterectomy but otherwise hasn't had any surgeries or required hospitalization. She had never been told of a heart murmur in the past.    She still smokes but has cut back to a pack of cigarettes over 3 days. She is also drinking alcohol and doesn't want to quantify how much she is drinking - states it depends on how she is doing.      Review of Systems: [y] = yes, [ ]  = no   General: Weight gain [ ] ; Weight loss [ ] ; Anorexia [ ] ; Fatigue [ ] ; Fever [ ] ; Chills [ ] ; Weakness [ ]   Cardiac: Chest pain/pressure [ ] ; Resting SOB [ ] ; Exertional SOB [ ] ; Orthopnea [ ] ; Pedal Edema [ ] ; Palpitations [ ] ; Syncope [ ] ; Presyncope [ ] ; Paroxysmal nocturnal dyspnea[ ]   Pulmonary: Cough [ ] ; Wheezing[ ] ; Hemoptysis[ ] ; Sputum [ ] ; Snoring [ ]   GI: Vomiting[ ] ; Dysphagia[ ] ; Melena[ ] ; Hematochezia [ ] ; Heartburn[ ] ; Abdominal pain [ ] ; Constipation [ ] ; Diarrhea [ ] ; BRBPR [ ]   GU: Hematuria[ ] ; Dysuria [ ] ; Nocturia[ ]   Vascular: Pain in legs with walking [ ] ; Pain in feet with lying flat [ ] ; Non-healing sores [ ] ; Stroke [ ] ; TIA [ ] ; Slurred speech [ ] ;  Neuro: Headaches[ ] ; Vertigo[ ] ; Seizures[ ] ; Paresthesias[ ] ;Blurred vision [ ] ; Diplopia [ ] ; Vision changes [ ]   Ortho/Skin: Arthritis [ ] ; Joint pain [ ] ; Muscle pain [ ] ; Joint  swelling [ ] ; Back Pain [ ] ; Rash [ ]   Psych: Depression[ ] ; Anxiety[ ]   Heme: Bleeding problems [ ] ; Clotting disorders [ ] ; Anemia [ ]   Endocrine: Diabetes [ ] ; Thyroid dysfunction[ ]    Past Medical History:  Diagnosis Date   Borderline diabetes    COPD (chronic obstructive pulmonary disease) (HCC)    Habitual alcohol use    Tobacco abuse     Current Outpatient Medications  Medication Sig Dispense Refill   albuterol (VENTOLIN HFA) 108 (90 Base) MCG/ACT inhaler Inhale 1-2 puffs into the lungs every 6 (six) hours as needed for wheezing or shortness of breath.     aspirin EC 81 MG EC tablet Take 1 tablet (81 mg  total) by mouth daily. Swallow whole. 30 tablet 11   atorvastatin (LIPITOR) 40 MG tablet Take 1 tablet (40 mg total) by mouth daily. 30 tablet 3   furosemide (LASIX) 40 MG tablet Take 1 tablet (40 mg total) by mouth 2 (two) times daily. 60 tablet 3   metoprolol succinate (TOPROL-XL) 25 MG 24 hr tablet Take 1 tablet (25 mg total) by mouth daily. 30 tablet 3   nitroGLYCERIN (NITROSTAT) 0.4 MG SL tablet Place 0.4 mg under the tongue as needed for chest pain.     sacubitril-valsartan (ENTRESTO) 24-26 MG Take 1 tablet by mouth 2 (two) times daily. 60 tablet 0   spironolactone (ALDACTONE) 25 MG tablet Take 0.5 tablets (12.5 mg total) by mouth daily. 15 tablet 3   No current facility-administered medications for this encounter.    No Known Allergies    Social History   Socioeconomic History   Marital status: Single    Spouse name: Not on file   Number of children: Not on file   Years of education: Not on file   Highest education level: Not on file  Occupational History   Not on file  Tobacco Use   Smoking status: Current Every Day Smoker    Types: Cigarettes   Smokeless tobacco: Never Used  Substance and Sexual Activity   Alcohol use: Yes    Comment: Usually drinks vodka or gin or beer, 3 days out of the week   Drug use: Never   Sexual activity: Not on file  Other Topics Concern   Not on file  Social History Narrative   Not on file   Social Determinants of Health   Financial Resource Strain: Not on file  Food Insecurity: Not on file  Transportation Needs: Not on file  Physical Activity: Not on file  Stress: Not on file  Social Connections: Not on file  Intimate Partner Violence: Not on file      Family History  Problem Relation Age of Onset   Stroke Maternal Grandmother        ? Possible heart disease too - patient unsure    There were no vitals filed for this visit.  PHYSICAL EXAM: General:  Well appearing. No respiratory difficulty HEENT: normal Neck: supple.  no JVD. Carotids 2+ bilat; no bruits. No lymphadenopathy or thryomegaly appreciated. Cor: PMI nondisplaced. Regular rate & rhythm. No rubs, gallops or murmurs. Lungs: clear Abdomen: soft, nontender, nondistended. No hepatosplenomegaly. No bruits or masses. Good bowel sounds. Extremities: no cyanosis, clubbing, rash, edema Neuro: alert & oriented x 3, cranial nerves grossly intact. moves all 4 extremities w/o difficulty. Affect pleasant.  ECG: NSR 82 narrow QRS. Nonspecific STs Personally reviewed   ASSESSMENT & PLAN:  1. 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 Severe MR - TEE reviewed personally. this appears primarily functional in nature - undergoing MitraClip evaluation. Will need to assess response to GDMT prior to proceeding. Suspect MR will recover with treatment of cardiomyopathy  - as above, will get cMRI to further evaluate etiology of CM and reassess EF and MR  3. Tobacco and ETOH dependence - encouraged complete cessation   Arvilla Meres, MD  8:43 AM

## 2020-04-11 ENCOUNTER — Encounter (HOSPITAL_COMMUNITY): Payer: Self-pay | Admitting: Cardiovascular Disease

## 2020-04-11 ENCOUNTER — Other Ambulatory Visit (HOSPITAL_COMMUNITY): Payer: Medicare HMO

## 2020-04-12 ENCOUNTER — Telehealth: Payer: Self-pay

## 2020-04-12 NOTE — Telephone Encounter (Signed)
  [facility]   MULTIDISCIPLINARY HEART VALVE TEAM  Pt was scheduled for Echo on 3/28 but no showed for this appointment.  I contacted the pt to reschedule this appointment but she advised that she did not have transportation at this time to come in for an Echo.  The pt has a pending appointment with Dr [name] on 4/7 and I reviewed this appointment information in depth with the pt and gave directions to the [facility] and parking instructions.  The pt did not have this appointment written down on her calendar when I contacted her and I advised her of the importance of evaluation with the HF team.

## 2020-04-21 ENCOUNTER — Ambulatory Visit (HOSPITAL_COMMUNITY)
Admission: RE | Admit: 2020-04-21 | Discharge: 2020-04-21 | Disposition: A | Discharge status: Home / Self Care | Payer: Medicare HMO | Source: Ambulatory Visit | Attending: Internal Medicine | Admitting: Internal Medicine

## 2020-04-21 ENCOUNTER — Other Ambulatory Visit: Payer: Self-pay

## 2020-04-21 ENCOUNTER — Encounter (HOSPITAL_COMMUNITY): Payer: Self-pay | Admitting: Internal Medicine

## 2020-04-21 VITALS — BP 130/68 | HR 67 | Wt 148.0 lb

## 2020-04-21 DIAGNOSIS — I272 Pulmonary hypertension, unspecified: Secondary | ICD-10-CM | POA: Diagnosis not present

## 2020-04-21 DIAGNOSIS — F17210 Nicotine dependence, cigarettes, uncomplicated: Secondary | ICD-10-CM | POA: Insufficient documentation

## 2020-04-21 DIAGNOSIS — F1010 Alcohol abuse, uncomplicated: Secondary | ICD-10-CM | POA: Diagnosis not present

## 2020-04-21 DIAGNOSIS — F1020 Alcohol dependence, uncomplicated: Secondary | ICD-10-CM | POA: Diagnosis not present

## 2020-04-21 DIAGNOSIS — Z720 Tobacco use: Secondary | ICD-10-CM | POA: Diagnosis not present

## 2020-04-21 DIAGNOSIS — I340 Nonrheumatic mitral (valve) insufficiency: Secondary | ICD-10-CM | POA: Insufficient documentation

## 2020-04-21 DIAGNOSIS — I428 Other cardiomyopathies: Secondary | ICD-10-CM | POA: Diagnosis not present

## 2020-04-21 DIAGNOSIS — I2510 Atherosclerotic heart disease of native coronary artery without angina pectoris: Secondary | ICD-10-CM | POA: Insufficient documentation

## 2020-04-21 DIAGNOSIS — Z79899 Other long term (current) drug therapy: Secondary | ICD-10-CM | POA: Diagnosis not present

## 2020-04-21 DIAGNOSIS — I5022 Chronic systolic (congestive) heart failure: Secondary | ICD-10-CM | POA: Diagnosis not present

## 2020-04-21 DIAGNOSIS — Z7982 Long term (current) use of aspirin: Secondary | ICD-10-CM | POA: Diagnosis not present

## 2020-04-21 DIAGNOSIS — J449 Chronic obstructive pulmonary disease, unspecified: Secondary | ICD-10-CM | POA: Insufficient documentation

## 2020-04-21 DIAGNOSIS — I5042 Chronic combined systolic (congestive) and diastolic (congestive) heart failure: Secondary | ICD-10-CM | POA: Diagnosis not present

## 2020-04-21 LAB — BRAIN NATRIURETIC PEPTIDE: B Natriuretic Peptide: 604.7 pg/mL — ABNORMAL HIGH (ref 0.0–100.0)

## 2020-04-21 LAB — BASIC METABOLIC PANEL
Anion gap: 6 (ref 5–15)
BUN: 9 mg/dL (ref 8–23)
CO2: 26 mmol/L (ref 22–32)
Calcium: 9.1 mg/dL (ref 8.9–10.3)
Chloride: 103 mmol/L (ref 98–111)
Creatinine, Ser: 0.81 mg/dL (ref 0.44–1.00)
GFR, Estimated: >60 mL/min (ref >60)
Glucose, Bld: 130 mg/dL — ABNORMAL HIGH (ref 70–99)
Potassium: 3.5 mmol/L (ref 3.5–5.1)
Sodium: 135 mmol/L (ref 135–145)

## 2020-04-21 MED ORDER — ENTRESTO 24-26 MG PO TABS: 1.0000 | ORAL_TABLET | Freq: Two times a day (BID) | ORAL | 3 refills | Status: DC

## 2020-04-21 NOTE — Progress Notes (Signed)
ADVANCED HF CLINIC CONSULT NOTE  Referring Physician: Dr. [name] Primary Care: Norm Salt, [place] Primary Cardiologist: [name], MD  HPI:  Emma Hahn is a 70 y.o. female referred for evaluation of severe mitral regurgitation and heart failure by Dr. [name]. She has been followed by Dr. [name] and was recently referred to Dr. [name] for consideration of a MitraClip for her severe MR.    The patient has a history of alcohol and tobacco use with COPD.  She developed dyspnea and chest pressure in May 2021.  She reportedly had an echocardiogram performed with recommendations to have outpatient cardiology evaluation.  The patient declined any further assessment at that time.  She went on to develop worsening shortness of breath and chest pressure, prompting hospitalization in 11/21.  At that time she was found to have severe LV dysfunction with LVEF 25 to 30% as well as severe mitral and tricuspid regurgitation.  She ultimately underwent right and left heart catheterization on 12/08/19 demonstrating patent coronary arteries with no obstructive CAD.  She had mild pulmonary hypertension with a mean PA pressure of 32 mmHg likely related to left heart disease with pulmonary capillary wedge pressure of 27 mmHg with a v-wave of 33.  She was noted to have low cardiac output and index by the Fick method, with an output of 2.5 L/min and an index of 1.5 L/min/m.  Medical therapy has been titrated but limited by low BP. She is now on Entresto 24/26 bid, spiro 12.5 daily and Toprol 25 daily. She takes lasix 40 bid.    TEE 12/09/19 EF 25-30% predominantly central MR with mild posterior component due to tethering of posterior MV leaflet  Says she feels fine except for aching in her shoulders. Every once in a while feels like she needs to take a deep breath. But denies DOE, CP, orthopnea or PND. Says knees hurt when she walks up steps. Says she stopped Entresto because she doesn't have  it, can't afford it and didn't send the paperwork in. Not taking meds every day   She still smokes but has cut back to a pack of cigarettes over 3 days. She drinks Miller beer and liquor except for rum. Says on average maybe one drink per day but she is quite evasive.    Review of Systems: [y] = yes, [ ]  = no   . General: Weight gain [ ] ; Weight loss [ ] ; Anorexia [ ] ; Fatigue [ ] ; Fever [ ] ; Chills [ ] ; Weakness [ ]   . Cardiac: Chest pain/pressure [ ] ; Resting SOB [ ] ; Exertional SOB [ ] ; Orthopnea [ ] ; Pedal Edema [ ] ; Palpitations [ ] ; Syncope [ ] ; Presyncope [ ] ; Paroxysmal nocturnal dyspnea[ ]   . Pulmonary: Cough [ ] ; Wheezing[ ] ; Hemoptysis[ ] ; Sputum [ ] ; Snoring [ ]   . GI: Vomiting[ ] ; Dysphagia[ ] ; Melena[ ] ; Hematochezia [ ] ; Heartburn[ ] ; Abdominal pain [ ] ; Constipation [ ] ; Diarrhea [ ] ; BRBPR [ ]   . GU: Hematuria[ ] ; Dysuria [ ] ; Nocturia[ ]   . Vascular: Pain in legs with walking ]; Pain in feet with lying flat [ ] ; Non-healing sores [ ] ; Stroke [ ] ; TIA [ ] ; Slurred speech [ ] ;  . Neuro: Headaches[ ] ; Vertigo[ ] ; Seizures[ ] ; Paresthesias[ ] ;Blurred vision [ ] ; Diplopia [ ] ; Vision changes [ ]   . Ortho/Skin: Arthritis ]; Joint pain [ y]; Muscle pain [ ] ; Joint swelling [ ] ; Back Pain [ ] ; Rash [ ]   .  Psych: Depression[ ] ; Anxiety[ ]   . Heme: Bleeding problems [ ] ; Clotting disorders [ ] ; Anemia [ ]   . Endocrine: Diabetes [ ] ; Thyroid dysfunction[ ]    Past Medical History:  Diagnosis Date  . Borderline diabetes   . COPD (chronic obstructive pulmonary disease) (HCC)   . Habitual alcohol use   . Tobacco abuse     Current Outpatient Medications  Medication Sig Dispense Refill  . albuterol (VENTOLIN HFA) 108 (90 Base) MCG/ACT inhaler Inhale 1-2 puffs into the lungs every 6 (six) hours as needed for wheezing or shortness of breath.    aspirin EC 81 MG EC tablet Take 1 tablet (81 mg total) by mouth daily. Swallow whole. 30 tablet 11  . atorvastatin (LIPITOR) 40 MG  tablet Take 1 tablet (40 mg total) by mouth daily. 30 tablet 3  . furosemide (LASIX) 40 MG tablet Take 1 tablet (40 mg total) by mouth 2 (two) times daily. 60 tablet 3  . metoprolol succinate (TOPROL-XL) 25 MG 24 hr tablet Take 1 tablet (25 mg total) by mouth daily. 30 tablet 3  . nitroGLYCERIN (NITROSTAT) 0.4 MG SL tablet Place 0.4 mg under the tongue as needed for chest pain.    spironolactone (ALDACTONE) 25 MG tablet Take 0.5 tablets (12.5 mg total) by mouth daily. 15 tablet 3   No current facility-administered medications for this encounter.    No Known Allergies    Social History   Socioeconomic History  . Marital status: Single    Spouse name: Not on file  . Number of children: Not on file  . Years of education: Not on file  . Highest education level: Not on file  Occupational History  . Not on file  Tobacco Use  . Smoking status: Current Every Day Smoker    Types: Cigarettes  . Smokeless tobacco: Never Used  Substance and Sexual Activity  . Alcohol use: Yes    Comment: Usually drinks vodka or gin or beer, 3 days out of the week  . Drug use: Never  . Sexual activity: Not on file  Other Topics Concern  . Not on file  Social History Narrative  . Not on file   Social Determinants of Health   Financial Resource Strain: Not on file  Food Insecurity: Not on file  Transportation Needs: Not on file  Physical Activity: Not on file  Stress: Not on file  Social Connections: Not on file  Intimate Partner Violence: Not on file      Family History  Problem Relation Age of Onset  . Stroke Maternal Grandmother        ? Possible heart disease too - patient unsure    Vitals:   04/21/20 1135  BP: 130/68  Pulse: 67  SpO2: 97%  Weight: 67.1 kg (148 lb)    PHYSICAL EXAM: General:  Well appearing. No resp difficulty HEENT: normal Neck: supple. no JVD. Carotids 2+ bilat; no bruits. No lymphadenopathy or thryomegaly appreciated. Cor: PMI nondisplaced. Regular rate &  rhythm. 3/6 MR Lungs: clear. Decreased throughout  Abdomen: soft, nontender, nondistended. No hepatosplenomegaly. No bruits or masses. Good bowel sounds. Extremities: no cyanosis, clubbing, rash, edema Neuro: alert & orientedx3, cranial nerves grossly intact. moves all 4 extremities w/o difficulty. Affect pleasant   ECG: NSR 73 narrow QRS. Nonspecific STs Personally reviewed   ASSESSMENT & PLAN:  1. 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 ? ETOH - echo 11/21 EF 25-30% - cath 11/21 non-obstructive CAD - NYHA  II - Volume status ok - taking lasix intermittently - Restart Entresto 24/26 bid - Continue spiro 12.5 daily - Continue Toprol 25 daily - Will get cMRI to further evaluate etiology of CM and reassess EF and MR  - She has very poor insight into her HF and not committed to medical therapy. Long talk about need to focus on taking her meds and giving her heart the chance to heal. Discussed the need to limit/stop ETOH use - f/u [facility] for med titration then back with me in 2-3 months  2. Severe MR - TEE reviewed personally. this appears primarily functional in nature - undergoing MitraClip evaluation. Will need to assess response to GDMT prior to proceeding. Suspect MR will recover with treatment of cardiomyopathy but not sure she will be compliant - as above, will get cMRI to further evaluate etiology of CM and reassess EF and MR  3. Tobacco and ETOH dependence - encouraged complete cessation   Arvilla Meres, MD  12:10 PM

## 2020-04-21 NOTE — Progress Notes (Signed)
Medication Samples have been provided to the patient.  Drug name: Sherryll Burger       Strength: 24/26mg         Qty: 2  LOT: [id number]  Exp.Date: 04/2022  Dosing instructions: take 1 tab Twice daily   The patient has been instructed regarding the correct time, dose, and frequency of taking this medication, including desired effects and most common side effects.   [name] 12:45 PM 04/21/2020   [employer] pt assist application completed and signed, will send to [employer]

## 2020-04-21 NOTE — Patient Instructions (Signed)
Start Entresto 24/26 mg Twice daily, we have provided you with samples of this and completed the Patient Assistance Form that we will send in to the company for you  Labs done today, we will call you for abnormal results  Your physician has requested that you have a cardiac MRI. Cardiac MRI uses a computer to create images of your heart as its beating, producing both still and moving pictures of your heart and major blood vessels. For further information please visit [email or web address]. Please follow the instruction sheet given to you today for more information. ONCE APPROVED BY YOUR INSURANCE COMPANY WE WILL CONTACT YOU TO SCHEDULE  Please follow up with our heart failure pharmacist in 4 weeks  Your physician recommends that you schedule a follow-up appointment in: 3-4 months  If you have any questions or concerns before your next appointment please send [place] a message through [place] or call our office at [phone].    TO LEAVE A MESSAGE FOR THE NURSE SELECT OPTION 2, PLEASE LEAVE A MESSAGE INCLUDING: . YOUR NAME . DATE OF BIRTH . CALL BACK NUMBER . REASON FOR CALL**this is important as we prioritize the call backs  YOU WILL RECEIVE A CALL BACK THE SAME DAY AS LONG AS YOU CALL BEFORE 4:00 PM  At the [facility], you and your health needs are our priority. As part of our continuing mission to provide you with exceptional heart care, we have created designated Provider Care Teams. These Care Teams include your primary Cardiologist (physician) and Advanced Practice Providers (APPs- Physician Assistants and Nurse Practitioners) who all work together to provide you with the care you need, when you need it.   You may see any of the following providers on your designated Care Team at your next follow up: Marland Kitchen Dr Arvilla Meres . Dr Marca Ancona . Dr Thornell Mule . Tonye Becket, NP . Robbie Lis, PA . Shanda Bumps [place],NP . Karle Plumber, PharmD   Please be sure to  bring in all your medications bottles to every appointment.

## 2020-04-21 NOTE — Addendum Note (Signed)
Encounter addended by: Dolores Patty, MD on: 04/21/2020 1:00 PM  Actions taken: Level of Service modified, Visit diagnoses modified

## 2020-04-22 ENCOUNTER — Other Ambulatory Visit (HOSPITAL_COMMUNITY): Payer: Self-pay

## 2020-04-25 ENCOUNTER — Telehealth (HOSPITAL_COMMUNITY): Payer: Self-pay | Admitting: Cardiovascular Disease

## 2020-04-25 NOTE — Telephone Encounter (Signed)
Just an FYI. We have made several attempts to contact this patient including sending a letter to schedule or reschedule their echocardiogram. We will be removing the patient from the echo WQ.   04/11/20 NO SHOWED- MAILED LETTER-LBW     Thank you 

## 2020-04-27 ENCOUNTER — Other Ambulatory Visit (HOSPITAL_COMMUNITY): Payer: Self-pay

## 2020-05-05 ENCOUNTER — Telehealth (HOSPITAL_COMMUNITY): Payer: Self-pay | Admitting: Pharmacy Technician

## 2020-05-05 NOTE — Telephone Encounter (Signed)
Advanced Heart Failure Patient Advocate Encounter   Patient was approved to receive Entresto from [employer]  Patient ID: [id number] Effective dates: 05/02/20 through 01/14/21  Called and spoke with the patient.   Archer Asa, CPhT

## 2020-05-11 ENCOUNTER — Other Ambulatory Visit (HOSPITAL_COMMUNITY): Payer: Self-pay | Admitting: *Deleted

## 2020-05-11 DIAGNOSIS — I5042 Chronic combined systolic (congestive) and diastolic (congestive) heart failure: Secondary | ICD-10-CM

## 2020-05-25 ENCOUNTER — Other Ambulatory Visit (HOSPITAL_COMMUNITY): Payer: Self-pay

## 2020-05-26 ENCOUNTER — Ambulatory Visit (HOSPITAL_COMMUNITY): Payer: Medicare HMO

## 2020-05-31 ENCOUNTER — Other Ambulatory Visit (HOSPITAL_COMMUNITY): Payer: Self-pay | Admitting: *Deleted

## 2020-05-31 MED ORDER — ATORVASTATIN CALCIUM 40 MG PO TABS
40.0000 mg | ORAL_TABLET | Freq: Every day | ORAL | 3 refills | Status: DC
Start: 2020-05-31 — End: 2021-07-14

## 2020-05-31 MED ORDER — FUROSEMIDE 40 MG PO TABS
40.0000 mg | ORAL_TABLET | Freq: Two times a day (BID) | ORAL | 3 refills | Status: DC
Start: 2020-05-31 — End: 2021-07-14

## 2020-05-31 MED ORDER — METOPROLOL SUCCINATE ER 25 MG PO TB24
25.0000 mg | ORAL_TABLET | Freq: Every day | ORAL | 3 refills | Status: DC
Start: 2020-05-31 — End: 2021-07-14

## 2020-06-23 DIAGNOSIS — E7800 Pure hypercholesterolemia, unspecified: Secondary | ICD-10-CM | POA: Diagnosis not present

## 2020-06-23 DIAGNOSIS — R7303 Prediabetes: Secondary | ICD-10-CM | POA: Diagnosis not present

## 2020-06-23 DIAGNOSIS — Z131 Encounter for screening for diabetes mellitus: Secondary | ICD-10-CM | POA: Diagnosis not present

## 2020-06-23 DIAGNOSIS — Z1329 Encounter for screening for other suspected endocrine disorder: Secondary | ICD-10-CM | POA: Diagnosis not present

## 2020-06-23 DIAGNOSIS — E6609 Other obesity due to excess calories: Secondary | ICD-10-CM | POA: Diagnosis not present

## 2020-06-23 DIAGNOSIS — I10 Essential (primary) hypertension: Secondary | ICD-10-CM | POA: Diagnosis not present

## 2020-06-23 DIAGNOSIS — Z136 Encounter for screening for cardiovascular disorders: Secondary | ICD-10-CM | POA: Diagnosis not present

## 2020-06-23 DIAGNOSIS — Z0001 Encounter for general adult medical examination with abnormal findings: Secondary | ICD-10-CM | POA: Diagnosis not present

## 2020-06-23 DIAGNOSIS — I5042 Chronic combined systolic (congestive) and diastolic (congestive) heart failure: Secondary | ICD-10-CM | POA: Diagnosis not present

## 2020-06-24 DIAGNOSIS — Z131 Encounter for screening for diabetes mellitus: Secondary | ICD-10-CM | POA: Diagnosis not present

## 2020-06-24 DIAGNOSIS — Z136 Encounter for screening for cardiovascular disorders: Secondary | ICD-10-CM | POA: Diagnosis not present

## 2020-06-24 DIAGNOSIS — Z1329 Encounter for screening for other suspected endocrine disorder: Secondary | ICD-10-CM | POA: Diagnosis not present

## 2020-06-24 DIAGNOSIS — I5042 Chronic combined systolic (congestive) and diastolic (congestive) heart failure: Secondary | ICD-10-CM | POA: Diagnosis not present

## 2020-06-24 DIAGNOSIS — R7303 Prediabetes: Secondary | ICD-10-CM | POA: Diagnosis not present

## 2020-06-24 DIAGNOSIS — Z0001 Encounter for general adult medical examination with abnormal findings: Secondary | ICD-10-CM | POA: Diagnosis not present

## 2020-06-24 DIAGNOSIS — E6609 Other obesity due to excess calories: Secondary | ICD-10-CM | POA: Diagnosis not present

## 2020-06-24 DIAGNOSIS — I10 Essential (primary) hypertension: Secondary | ICD-10-CM | POA: Diagnosis not present

## 2020-06-24 DIAGNOSIS — E7800 Pure hypercholesterolemia, unspecified: Secondary | ICD-10-CM | POA: Diagnosis not present

## 2020-07-11 ENCOUNTER — Other Ambulatory Visit: Payer: Self-pay | Admitting: Internal Medicine

## 2020-07-11 DIAGNOSIS — Z1231 Encounter for screening mammogram for malignant neoplasm of breast: Secondary | ICD-10-CM

## 2020-07-12 ENCOUNTER — Other Ambulatory Visit: Payer: Self-pay

## 2020-07-12 ENCOUNTER — Ambulatory Visit
Admission: RE | Admit: 2020-07-12 | Discharge: 2020-07-12 | Disposition: A | Discharge status: Home / Self Care | Payer: Medicare HMO | Source: Ambulatory Visit | Attending: Internal Medicine | Admitting: Internal Medicine

## 2020-07-12 DIAGNOSIS — Z1231 Encounter for screening mammogram for malignant neoplasm of breast: Secondary | ICD-10-CM | POA: Diagnosis not present

## 2020-07-29 ENCOUNTER — Ambulatory Visit (HOSPITAL_COMMUNITY)
Admission: RE | Admit: 2020-07-29 | Discharge: 2020-07-29 | Disposition: A | Discharge status: Home / Self Care | Payer: Medicare HMO | Source: Ambulatory Visit | Attending: Internal Medicine | Admitting: Internal Medicine

## 2020-07-29 ENCOUNTER — Other Ambulatory Visit: Payer: Self-pay

## 2020-07-29 VITALS — BP 158/64 | HR 63 | Ht 64.0 in | Wt 147.2 lb

## 2020-07-29 DIAGNOSIS — Z79899 Other long term (current) drug therapy: Secondary | ICD-10-CM | POA: Insufficient documentation

## 2020-07-29 DIAGNOSIS — I340 Nonrheumatic mitral (valve) insufficiency: Secondary | ICD-10-CM | POA: Diagnosis not present

## 2020-07-29 DIAGNOSIS — I5022 Chronic systolic (congestive) heart failure: Secondary | ICD-10-CM | POA: Insufficient documentation

## 2020-07-29 DIAGNOSIS — Z720 Tobacco use: Secondary | ICD-10-CM

## 2020-07-29 DIAGNOSIS — I5042 Chronic combined systolic (congestive) and diastolic (congestive) heart failure: Secondary | ICD-10-CM | POA: Diagnosis not present

## 2020-07-29 DIAGNOSIS — J449 Chronic obstructive pulmonary disease, unspecified: Secondary | ICD-10-CM | POA: Insufficient documentation

## 2020-07-29 DIAGNOSIS — I428 Other cardiomyopathies: Secondary | ICD-10-CM | POA: Diagnosis not present

## 2020-07-29 DIAGNOSIS — Z823 Family history of stroke: Secondary | ICD-10-CM | POA: Insufficient documentation

## 2020-07-29 DIAGNOSIS — F1010 Alcohol abuse, uncomplicated: Secondary | ICD-10-CM

## 2020-07-29 DIAGNOSIS — I2510 Atherosclerotic heart disease of native coronary artery without angina pectoris: Secondary | ICD-10-CM | POA: Diagnosis not present

## 2020-07-29 DIAGNOSIS — F1020 Alcohol dependence, uncomplicated: Secondary | ICD-10-CM | POA: Diagnosis not present

## 2020-07-29 DIAGNOSIS — Z7982 Long term (current) use of aspirin: Secondary | ICD-10-CM | POA: Insufficient documentation

## 2020-07-29 DIAGNOSIS — F17210 Nicotine dependence, cigarettes, uncomplicated: Secondary | ICD-10-CM | POA: Insufficient documentation

## 2020-07-29 LAB — BASIC METABOLIC PANEL
Anion gap: 9 (ref 5–15)
BUN: 25 mg/dL — ABNORMAL HIGH (ref 8–23)
CO2: 28 mmol/L (ref 22–32)
Calcium: 9.9 mg/dL (ref 8.9–10.3)
Chloride: 98 mmol/L (ref 98–111)
Creatinine, Ser: 1 mg/dL (ref 0.44–1.00)
GFR, Estimated: >60 mL/min (ref >60)
Glucose, Bld: 110 mg/dL — ABNORMAL HIGH (ref 70–99)
Potassium: 3.8 mmol/L (ref 3.5–5.1)
Sodium: 135 mmol/L (ref 135–145)

## 2020-07-29 MED ORDER — ENTRESTO 49-51 MG PO TABS
1.0000 | ORAL_TABLET | Freq: Two times a day (BID) | ORAL | 11 refills | Status: DC
Start: 2020-07-29 — End: 2021-01-03

## 2020-07-29 NOTE — Progress Notes (Signed)
ADVANCED HF CLINIC CONSULT NOTE  Referring Physician: Dr. [name] Primary Care: Norm Salt, [place] Primary Cardiologist: [name], MD  HPI:  Emma Hahn is a 70 y.o. female  She has been followed by Dr. [name] and was recently referred to Dr. [name] for consideration of a MitraClip for her severe MR.    The patient has a history of alcohol and tobacco use with COPD.  She developed dyspnea and chest pressure in 5/21  She had echo performed with recommendations to have outpatient cardiology evaluation.  The patient declined any further assessment at that time.  She went on to develop worsening shortness of breath and chest pressure, prompting hospitalization in 11/21.  At that time she was found to have severe LV dysfunction with LVEF 25 to 30% as well as severe mitral and tricuspid regurgitation.  She ultimately underwent right and left heart catheterization on 12/08/19 demonstrating no obstructive CAD.  She had mild pulmonary hypertension with a mean PA pressure of 32 mmHg with ,mean PCWP 27 mmHg with a v-wave of 33. Fick was low 2.5/1.5   Medical therapy has been titrated but limited by low BP.   TEE 12/09/19 EF 25-30% predominantly central MR with mild posterior component due to tethering of posterior MV leaflet  Here for f/u. Overall feeling fine. Does not want surgery/Clip Denies SOB unless walking fast. Denies PND/Orthopnea. Appetite ok. No fever or chills. Weight at home 145-148  pounds. Taking all medications. Denies dizziness or palpitations. + arthritis. Smoking 1/2 PPD . Drinking 24 ounces of beer a week    Past Medical History:  Diagnosis Date   Borderline diabetes    COPD (chronic obstructive pulmonary disease) (HCC)    Habitual alcohol use    Tobacco abuse     Current Outpatient Medications  Medication Sig Dispense Refill   albuterol (VENTOLIN HFA) 108 (90 Base) MCG/ACT inhaler Inhale 1-2 puffs into the lungs every 6 (six) hours as needed for  wheezing or shortness of breath.     aspirin EC 81 MG EC tablet Take 1 tablet (81 mg total) by mouth daily. Swallow whole. 30 tablet 11   atorvastatin (LIPITOR) 40 MG tablet Take 1 tablet (40 mg total) by mouth daily. 30 tablet 3   furosemide (LASIX) 40 MG tablet Take 1 tablet (40 mg total) by mouth 2 (two) times daily. 60 tablet 3   metoprolol succinate (TOPROL-XL) 25 MG 24 hr tablet Take 1 tablet (25 mg total) by mouth daily. 30 tablet 3   nitroGLYCERIN (NITROSTAT) 0.4 MG SL tablet Place 0.4 mg under the tongue as needed for chest pain.     sacubitril-valsartan (ENTRESTO) 24-26 MG Take 1 tablet by mouth 2 (two) times daily. 60 tablet 3   spironolactone (ALDACTONE) 25 MG tablet Take 0.5 tablets (12.5 mg total) by mouth daily. 15 tablet 3   No current facility-administered medications for this encounter.    No Known Allergies    Social History   Socioeconomic History   Marital status: Single    Spouse name: Not on file   Number of children: Not on file   Years of education: Not on file   Highest education level: Not on file  Occupational History   Not on file  Tobacco Use   Smoking status: Every Day    Types: Cigarettes   Smokeless tobacco: Never  Substance and Sexual Activity   Alcohol use: Yes    Comment: Usually drinks vodka or gin or beer, 3  days out of the week   Drug use: Never   Sexual activity: Not on file  Other Topics Concern   Not on file  Social History Narrative   Not on file   Social Determinants of Health   Financial Resource Strain: Not on file  Food Insecurity: Not on file  Transportation Needs: Not on file  Physical Activity: Not on file  Stress: Not on file  Social Connections: Not on file  Intimate Partner Violence: Not on file      Family History  Problem Relation Age of Onset   Stroke Maternal Grandmother        ? Possible heart disease too - patient unsure    Vitals:   07/29/20 1148  BP: (!) 158/64  Pulse: 63  SpO2: 97%  Weight:  66.8 kg (147 lb 3.2 oz)  Height: 5\' 4"  (1.626 m)    PHYSICAL EXAM: General:  Well appearing. No resp difficulty HEENT: normal Neck: supple. no JVD. Carotids 2+ bilat; no bruits. No lymphadenopathy or thryomegaly appreciated. Cor: PMI nondisplaced. Regular rate & rhythm. I do not hear MR Lungs: clear Abdomen: obese soft, nontender, nondistended. No hepatosplenomegaly. No bruits or masses. Good bowel sounds. Extremities: no cyanosis, clubbing, rash, edema Neuro: alert & orientedx3, cranial nerves grossly intact. moves all 4 extremities w/o difficulty. Affect pleasant   ECG: NSR 73 narrow QRS. Nonspecific STs Personally reviewed   ASSESSMENT & PLAN:  1. 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 ? 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 At this point will hold off and obtain ECHO next visit.  - She has very poor insight into her HF and not committed to medical therapy. Long talk about need to focus on taking her meds and giving her heart the chance to heal. Discussed the need to limit/stop ETOH use - f/u [facility] for med titration. Encouraged compliance with meds. - labs today  2. Severe MR - TEE reviewed MR appears primarily functional in nature - I am hopeful that MR will recover with treatment of underlying cardiomyopathy. She says she is really not interested in Clip at this point - I do not hear any MR on exam  - as above, will repeat echo next visit  3. Tobacco and ETOH dependence - encouraged complete cessation of both  12/21, MD  10:55 PM

## 2020-07-29 NOTE — Patient Instructions (Signed)
Increase Entresto to 49/51 mg Twice daily  Lab work drawn today,we will call you with any abnormal results  Your physician recommends that you schedule a follow-up appointment in: 8 weeks  If you have any questions or concerns before your next appointment please send [place] a message through [place] or call our office at [phone].    TO LEAVE A MESSAGE FOR THE NURSE SELECT OPTION 2, PLEASE LEAVE A MESSAGE INCLUDING: YOUR NAME DATE OF BIRTH CALL BACK NUMBER REASON FOR CALL**this is important as we prioritize the call backs  YOU WILL RECEIVE A CALL BACK THE SAME DAY AS LONG AS YOU CALL BEFORE 4:00 PM  milAt the [facility], you and your health needs are our priority. As part of our continuing mission to provide you with exceptional heart care, we have created designated Provider Care Teams. These Care Teams include your primary Cardiologist (physician) and Advanced Practice Providers (APPs- Physician Assistants and Nurse Practitioners) who all work together to provide you with the care you need, when you need it.   You may see any of the following providers on your designated Care Team at your next follow up: Dr Arvilla Meres Dr Marca Ancona Dr Brandon Melnick, NP Robbie Lis, [place] Mikki Santee Karle Plumber, PharmD   Please be sure to bring in all your medications bottles to every appointment.

## 2020-07-29 NOTE — Progress Notes (Signed)
Error

## 2020-08-16 ENCOUNTER — Other Ambulatory Visit (HOSPITAL_COMMUNITY): Payer: Self-pay

## 2020-08-16 DIAGNOSIS — I10 Essential (primary) hypertension: Secondary | ICD-10-CM | POA: Diagnosis not present

## 2020-08-16 DIAGNOSIS — E6609 Other obesity due to excess calories: Secondary | ICD-10-CM | POA: Diagnosis not present

## 2020-08-16 DIAGNOSIS — Z0001 Encounter for general adult medical examination with abnormal findings: Secondary | ICD-10-CM | POA: Diagnosis not present

## 2020-08-16 DIAGNOSIS — F5101 Primary insomnia: Secondary | ICD-10-CM | POA: Diagnosis not present

## 2020-08-16 DIAGNOSIS — I5042 Chronic combined systolic (congestive) and diastolic (congestive) heart failure: Secondary | ICD-10-CM

## 2020-08-16 DIAGNOSIS — Z6830 Body mass index (BMI) 30.0-30.9, adult: Secondary | ICD-10-CM | POA: Diagnosis not present

## 2020-08-16 DIAGNOSIS — E7800 Pure hypercholesterolemia, unspecified: Secondary | ICD-10-CM | POA: Diagnosis not present

## 2020-08-16 DIAGNOSIS — R7303 Prediabetes: Secondary | ICD-10-CM | POA: Diagnosis not present

## 2020-08-16 NOTE — Progress Notes (Signed)
Orders Placed This Encounter  Procedures   ECHOCARDIOGRAM COMPLETE    Standing Status:   Future    Standing Expiration Date:   08/16/2021    Order Specific Question:   Where should this test be performed    Answer:   [place]    Order Specific Question:   Perflutren DEFINITY (image enhancing agent) should be administered unless hypersensitivity or allergy exist    Answer:   Administer Perflutren    Order Specific Question:   Reason for exam-Echo    Answer:   Congestive Heart Failure  I50.9    Order Specific Question:   Release to patient    Answer:   Immediate

## 2020-09-27 ENCOUNTER — Encounter (HOSPITAL_COMMUNITY): Payer: Medicare HMO | Admitting: Internal Medicine

## 2020-09-27 ENCOUNTER — Ambulatory Visit (HOSPITAL_COMMUNITY): Payer: Medicare HMO

## 2020-11-01 DIAGNOSIS — F5101 Primary insomnia: Secondary | ICD-10-CM | POA: Diagnosis not present

## 2020-11-01 DIAGNOSIS — I5042 Chronic combined systolic (congestive) and diastolic (congestive) heart failure: Secondary | ICD-10-CM | POA: Diagnosis not present

## 2020-11-01 DIAGNOSIS — Z6830 Body mass index (BMI) 30.0-30.9, adult: Secondary | ICD-10-CM | POA: Diagnosis not present

## 2020-11-01 DIAGNOSIS — I10 Essential (primary) hypertension: Secondary | ICD-10-CM | POA: Diagnosis not present

## 2020-11-01 DIAGNOSIS — R052 Subacute cough: Secondary | ICD-10-CM | POA: Diagnosis not present

## 2020-11-01 DIAGNOSIS — E7800 Pure hypercholesterolemia, unspecified: Secondary | ICD-10-CM | POA: Diagnosis not present

## 2020-11-01 DIAGNOSIS — E6609 Other obesity due to excess calories: Secondary | ICD-10-CM | POA: Diagnosis not present

## 2020-11-01 DIAGNOSIS — R7303 Prediabetes: Secondary | ICD-10-CM | POA: Diagnosis not present

## 2020-12-01 ENCOUNTER — Encounter (HOSPITAL_COMMUNITY): Payer: Medicare HMO | Admitting: Internal Medicine

## 2020-12-01 ENCOUNTER — Ambulatory Visit (HOSPITAL_COMMUNITY): Admission: RE | Admit: 2020-12-01 | Payer: Medicare HMO | Source: Ambulatory Visit

## 2020-12-28 ENCOUNTER — Other Ambulatory Visit (HOSPITAL_COMMUNITY): Payer: Self-pay

## 2020-12-28 ENCOUNTER — Telehealth (HOSPITAL_COMMUNITY): Payer: Self-pay | Admitting: Pharmacy Technician

## 2020-12-28 NOTE — Telephone Encounter (Signed)
Advanced Heart Failure Patient Advocate Encounter  Spoke to patient regarding re-enrollment of Entresto assistance with [employer]. Will have the application for the patient to sign at the check in desk. She is aware to provide POI as well.   Will fax in once all signatures are obtained.

## 2020-12-29 ENCOUNTER — Other Ambulatory Visit (HOSPITAL_COMMUNITY): Payer: Self-pay

## 2021-01-03 ENCOUNTER — Other Ambulatory Visit (HOSPITAL_COMMUNITY): Payer: Self-pay

## 2021-01-03 MED ORDER — ENTRESTO 49-51 MG PO TABS
1.0000 | ORAL_TABLET | Freq: Two times a day (BID) | ORAL | 3 refills | Status: DC
Start: 2021-01-03 — End: 2021-07-14

## 2021-07-11 ENCOUNTER — Encounter (HOSPITAL_COMMUNITY): Payer: Self-pay | Admitting: Emergency Medicine

## 2021-07-11 ENCOUNTER — Other Ambulatory Visit: Payer: Self-pay

## 2021-07-11 ENCOUNTER — Emergency Department (HOSPITAL_COMMUNITY): Payer: Medicare HMO

## 2021-07-11 ENCOUNTER — Inpatient Hospital Stay (HOSPITAL_COMMUNITY)
Admission: EM | Admit: 2021-07-11 | Discharge: 2021-07-14 | DRG: 291 | Disposition: A | Discharge status: Home / Self Care | Payer: Medicare HMO | Attending: Internal Medicine | Admitting: Internal Medicine

## 2021-07-11 DIAGNOSIS — I340 Nonrheumatic mitral (valve) insufficiency: Secondary | ICD-10-CM

## 2021-07-11 DIAGNOSIS — I509 Heart failure, unspecified: Secondary | ICD-10-CM | POA: Diagnosis not present

## 2021-07-11 DIAGNOSIS — Z789 Other specified health status: Secondary | ICD-10-CM | POA: Diagnosis not present

## 2021-07-11 DIAGNOSIS — I5082 Biventricular heart failure: Secondary | ICD-10-CM | POA: Diagnosis present

## 2021-07-11 DIAGNOSIS — Z7982 Long term (current) use of aspirin: Secondary | ICD-10-CM

## 2021-07-11 DIAGNOSIS — I5023 Acute on chronic systolic (congestive) heart failure: Secondary | ICD-10-CM | POA: Diagnosis present

## 2021-07-11 DIAGNOSIS — I248 Other forms of acute ischemic heart disease: Secondary | ICD-10-CM | POA: Diagnosis not present

## 2021-07-11 DIAGNOSIS — I3139 Other pericardial effusion (noninflammatory): Secondary | ICD-10-CM | POA: Diagnosis present

## 2021-07-11 DIAGNOSIS — Z91128 Patient's intentional underdosing of medication regimen for other reason: Secondary | ICD-10-CM

## 2021-07-11 DIAGNOSIS — N179 Acute kidney failure, unspecified: Secondary | ICD-10-CM | POA: Diagnosis not present

## 2021-07-11 DIAGNOSIS — I428 Other cardiomyopathies: Secondary | ICD-10-CM | POA: Diagnosis present

## 2021-07-11 DIAGNOSIS — K5900 Constipation, unspecified: Secondary | ICD-10-CM | POA: Diagnosis present

## 2021-07-11 DIAGNOSIS — I472 Ventricular tachycardia, unspecified: Secondary | ICD-10-CM | POA: Diagnosis not present

## 2021-07-11 DIAGNOSIS — J811 Chronic pulmonary edema: Secondary | ICD-10-CM | POA: Diagnosis not present

## 2021-07-11 DIAGNOSIS — I051 Rheumatic mitral insufficiency: Secondary | ICD-10-CM | POA: Diagnosis not present

## 2021-07-11 DIAGNOSIS — I110 Hypertensive heart disease with heart failure: Secondary | ICD-10-CM | POA: Diagnosis not present

## 2021-07-11 DIAGNOSIS — F1010 Alcohol abuse, uncomplicated: Secondary | ICD-10-CM | POA: Diagnosis not present

## 2021-07-11 DIAGNOSIS — Z79899 Other long term (current) drug therapy: Secondary | ICD-10-CM | POA: Diagnosis not present

## 2021-07-11 DIAGNOSIS — I272 Pulmonary hypertension, unspecified: Secondary | ICD-10-CM | POA: Diagnosis not present

## 2021-07-11 DIAGNOSIS — E785 Hyperlipidemia, unspecified: Secondary | ICD-10-CM | POA: Diagnosis present

## 2021-07-11 DIAGNOSIS — F17210 Nicotine dependence, cigarettes, uncomplicated: Secondary | ICD-10-CM | POA: Diagnosis not present

## 2021-07-11 DIAGNOSIS — F1090 Alcohol use, unspecified, uncomplicated: Secondary | ICD-10-CM | POA: Diagnosis present

## 2021-07-11 DIAGNOSIS — Z91148 Patient's other noncompliance with medication regimen for other reason: Secondary | ICD-10-CM

## 2021-07-11 DIAGNOSIS — R7303 Prediabetes: Secondary | ICD-10-CM | POA: Diagnosis present

## 2021-07-11 DIAGNOSIS — J449 Chronic obstructive pulmonary disease, unspecified: Secondary | ICD-10-CM | POA: Diagnosis present

## 2021-07-11 DIAGNOSIS — Z90710 Acquired absence of both cervix and uterus: Secondary | ICD-10-CM

## 2021-07-11 DIAGNOSIS — Z720 Tobacco use: Secondary | ICD-10-CM | POA: Diagnosis not present

## 2021-07-11 DIAGNOSIS — I10 Essential (primary) hypertension: Secondary | ICD-10-CM | POA: Diagnosis not present

## 2021-07-11 DIAGNOSIS — R059 Cough, unspecified: Secondary | ICD-10-CM | POA: Diagnosis not present

## 2021-07-11 LAB — ECHOCARDIOGRAM COMPLETE
AR max vel: 2.23 cm2
AV Peak grad: 6.4 mmHg
Ao pk vel: 1.26 m/s
Area-P 1/2: 5.34 cm2
Calc EF: 45.9 %
Height: 64 in
MV M vel: 5.44 m/s
MV Peak grad: 118.4 mmHg
S' Lateral: 4.3 cm
Single Plane A2C EF: 45.3 %
Single Plane A4C EF: 48.7 %
Weight: 2352 oz

## 2021-07-11 LAB — CBC WITH DIFFERENTIAL/PLATELET
Abs Immature Granulocytes: 0.02 10*3/uL (ref 0.00–0.07)
Basophils Absolute: 0.1 10*3/uL (ref 0.0–0.1)
Basophils Relative: 1 %
Eosinophils Absolute: 0.2 10*3/uL (ref 0.0–0.5)
Eosinophils Relative: 3 %
HCT: 36.3 % (ref 36.0–46.0)
Hemoglobin: 11.4 g/dL — ABNORMAL LOW (ref 12.0–15.0)
Immature Granulocytes: 0 %
Lymphocytes Relative: 32 %
Lymphs Abs: 2.5 10*3/uL (ref 0.7–4.0)
MCH: 27.5 pg (ref 26.0–34.0)
MCHC: 31.4 g/dL (ref 30.0–36.0)
MCV: 87.5 fL (ref 80.0–100.0)
Monocytes Absolute: 0.8 10*3/uL (ref 0.1–1.0)
Monocytes Relative: 10 %
Neutro Abs: 4.3 10*3/uL (ref 1.7–7.7)
Neutrophils Relative %: 54 %
Platelets: 242 10*3/uL (ref 150–400)
RBC: 4.15 MIL/uL (ref 3.87–5.11)
RDW: 14.8 % (ref 11.5–15.5)
WBC: 7.9 10*3/uL (ref 4.0–10.5)
nRBC: 0 % (ref 0.0–0.2)

## 2021-07-11 LAB — BASIC METABOLIC PANEL
Anion gap: 10 (ref 5–15)
BUN: 9 mg/dL (ref 8–23)
CO2: 22 mmol/L (ref 22–32)
Calcium: 9.2 mg/dL (ref 8.9–10.3)
Chloride: 106 mmol/L (ref 98–111)
Creatinine, Ser: 0.91 mg/dL (ref 0.44–1.00)
GFR, Estimated: >60 mL/min (ref >60)
Glucose, Bld: 159 mg/dL — ABNORMAL HIGH (ref 70–99)
Potassium: 3.6 mmol/L (ref 3.5–5.1)
Sodium: 138 mmol/L (ref 135–145)

## 2021-07-11 LAB — MAGNESIUM: Magnesium: 1.7 mg/dL (ref 1.7–2.4)

## 2021-07-11 LAB — PHOSPHORUS: Phosphorus: 3.7 mg/dL (ref 2.5–4.6)

## 2021-07-11 LAB — BRAIN NATRIURETIC PEPTIDE: B Natriuretic Peptide: 3547.8 pg/mL — ABNORMAL HIGH (ref 0.0–100.0)

## 2021-07-11 LAB — TROPONIN I (HIGH SENSITIVITY)
Troponin I (High Sensitivity): 29 ng/L — ABNORMAL HIGH (ref <18)
Troponin I (High Sensitivity): 26 ng/L — ABNORMAL HIGH (ref <18)

## 2021-07-11 MED ORDER — ASPIRIN 81 MG PO TBEC
81.0000 mg | DELAYED_RELEASE_TABLET | Freq: Every day | ORAL | Status: DC
  Administered 2021-07-11 – 2021-07-14 (×3): 81 mg via ORAL
  Filled 2021-07-11 (×4): qty 1

## 2021-07-11 MED ORDER — ACETAMINOPHEN 325 MG PO TABS
650.0000 mg | ORAL_TABLET | Freq: Four times a day (QID) | ORAL | Status: DC | PRN
  Administered 2021-07-12: 650 mg via ORAL
  Filled 2021-07-11: qty 2

## 2021-07-11 MED ORDER — SPIRONOLACTONE 12.5 MG HALF TABLET
12.5000 mg | ORAL_TABLET | Freq: Every day | ORAL | Status: DC
  Administered 2021-07-12: 12.5 mg via ORAL
  Filled 2021-07-11: qty 1

## 2021-07-11 MED ORDER — FUROSEMIDE 10 MG/ML IJ SOLN: 40.0000 mg | Freq: Once | INTRAMUSCULAR | Status: DC

## 2021-07-11 MED ORDER — IPRATROPIUM-ALBUTEROL 0.5-2.5 (3) MG/3ML IN SOLN
3.0000 mL | Freq: Once | RESPIRATORY_TRACT | Status: AC
  Administered 2021-07-11: 3 mL via RESPIRATORY_TRACT
  Filled 2021-07-11: qty 3

## 2021-07-11 MED ORDER — FUROSEMIDE 10 MG/ML IJ SOLN
40.0000 mg | Freq: Once | INTRAMUSCULAR | Status: AC
  Administered 2021-07-11: 40 mg via INTRAVENOUS
  Filled 2021-07-11: qty 4

## 2021-07-11 MED ORDER — IPRATROPIUM-ALBUTEROL 0.5-2.5 (3) MG/3ML IN SOLN
3.0000 mL | RESPIRATORY_TRACT | Status: DC | PRN
  Administered 2021-07-11 – 2021-07-12 (×2): 3 mL via RESPIRATORY_TRACT
  Filled 2021-07-11 (×2): qty 3

## 2021-07-11 MED ORDER — ATORVASTATIN CALCIUM 40 MG PO TABS
40.0000 mg | ORAL_TABLET | Freq: Every day | ORAL | Status: DC
  Administered 2021-07-11 – 2021-07-14 (×3): 40 mg via ORAL
  Filled 2021-07-11 (×4): qty 1

## 2021-07-11 MED ORDER — POTASSIUM CHLORIDE CRYS ER 20 MEQ PO TBCR
40.0000 meq | EXTENDED_RELEASE_TABLET | Freq: Once | ORAL | Status: AC
Start: 2021-07-11 — End: 2021-07-11
  Administered 2021-07-11: 40 meq via ORAL
  Filled 2021-07-11: qty 2

## 2021-07-11 MED ORDER — TRAZODONE HCL 100 MG PO TABS
100.0000 mg | ORAL_TABLET | Freq: Every day | ORAL | Status: DC
  Administered 2021-07-11 – 2021-07-13 (×3): 100 mg via ORAL
  Filled 2021-07-11 (×3): qty 1

## 2021-07-11 MED ORDER — ENOXAPARIN SODIUM 40 MG/0.4ML IJ SOSY
40.0000 mg | PREFILLED_SYRINGE | Freq: Every day | INTRAMUSCULAR | Status: DC
  Administered 2021-07-11 – 2021-07-13 (×3): 40 mg via SUBCUTANEOUS
  Filled 2021-07-11 (×3): qty 0.4

## 2021-07-11 MED ORDER — ACETAMINOPHEN 650 MG RE SUPP: 650.0000 mg | Freq: Four times a day (QID) | RECTAL | Status: DC | PRN

## 2021-07-11 MED ORDER — SACUBITRIL-VALSARTAN 24-26 MG PO TABS
2.0000 | ORAL_TABLET | Freq: Two times a day (BID) | ORAL | Status: DC
  Administered 2021-07-12 (×2): 2 via ORAL
  Filled 2021-07-11 (×2): qty 2

## 2021-07-11 MED ORDER — SODIUM CHLORIDE 0.9% FLUSH
3.0000 mL | Freq: Two times a day (BID) | INTRAVENOUS | Status: DC
  Administered 2021-07-11 – 2021-07-14 (×7): 3 mL via INTRAVENOUS

## 2021-07-11 MED ORDER — FUROSEMIDE 10 MG/ML IJ SOLN
80.0000 mg | Freq: Two times a day (BID) | INTRAMUSCULAR | Status: DC
  Administered 2021-07-11 – 2021-07-12 (×3): 80 mg via INTRAVENOUS
  Filled 2021-07-11 (×3): qty 8

## 2021-07-11 MED ORDER — FUROSEMIDE 10 MG/ML IJ SOLN
80.0000 mg | Freq: Two times a day (BID) | INTRAMUSCULAR | Status: DC
  Administered 2021-07-11: 80 mg via INTRAVENOUS
  Filled 2021-07-11: qty 8

## 2021-07-11 MED ORDER — MAGNESIUM SULFATE 4 GM/100ML IV SOLN
4.0000 g | Freq: Once | INTRAVENOUS | Status: AC
  Administered 2021-07-11: 4 g via INTRAVENOUS
  Filled 2021-07-11 (×2): qty 100

## 2021-07-11 NOTE — ED Triage Notes (Signed)
Patient presents with labored breathing. C/o shortness of breath and cough worsening over the past 7 days. States she has not been able to smoke due to labored breathing. Reports clear mucus with cough. C/o central chest pain. States she does not like taking pills and stopped taking all of her medications in January.

## 2021-07-11 NOTE — H&P (Signed)
Date: 07/11/2021     Patient Name:  Emma Hahn MRN: [id number]  DOB: April 22, 1950 Age / Sex: 71 y.o., female   PCP: Norm Salt, PA         Medical Service: Internal Medicine Teaching Service       Attending Physician: Dr. Mercie Eon, MD    Intern (1st Contact): Adron Bene, MD Pager: [place] [phone]  Resident (2nd Contact): Doran Stabler, DO Pager: [place] [phone]       After Hours (After 5p/  First Contact Pager: [phone]  weekends / holidays): Second Contact Pager: [phone]   SUBJECTIVE:  Chief Complaint: Shortness of breath  History of Present Illness: Emma Hahn is a functionally independent 71 y.o. female with a pertinent PMH of HFrEF from nonischemic cardiomyopathy, hyperlipidemia, hypertension, prediabetes, COPD tobacco use disorder recent admission, who presents to [facility] with shortness of breath.  Patient states that she has a longstanding history of CHF.  She has been on multiple medications for this with multiple previous admissions for CHF exacerbation.  States that she recently discontinued all of her medications in January 2023.  Over the the subsequent months the patient has noticed increase shortness of breath, decreased exercise tolerance, orthopnea, abdominal bloating.  Is also noticed that she is developed some chest pain that is central and persistent over the last 7 days.  She does state that it is worse with exertion but denies any improvement with rest.  She does note that it does seem to be worse with deep breath and cough but not tender to palpation.  She states that she quit smoking roughly 7 days ago but otherwise has a longstanding history of tobacco use disorder.  Patient was previously seen by the heart failure team roughly a year ago at that time had not TEE that showed pretty significant HFrEF with a 25 to 30% EF.  Patient had a right and left heart cath with clear coronary arteries.  In the ED, the patient was given 40 mg IV Lasix.  Medications: No  current facility-administered medications on file prior to encounter.   Current Outpatient Medications on File Prior to Encounter  Medication Sig Dispense Refill   albuterol (VENTOLIN HFA) 108 (90 Base) MCG/ACT inhaler Inhale 2 puffs into the lungs as needed for wheezing or shortness of breath.     aspirin EC 81 MG EC tablet Take 1 tablet (81 mg total) by mouth daily. Swallow whole. 30 tablet 11   nitroGLYCERIN (NITROSTAT) 0.4 MG SL tablet Place 0.4 mg under the tongue every 5 (five) minutes as needed for chest pain.     sacubitril-valsartan (ENTRESTO) 24-26 MG Take 2 tablets by mouth 2 (two) times daily.     traZODone (DESYREL) 100 MG tablet Take 100 mg by mouth at bedtime.     atorvastatin (LIPITOR) 40 MG tablet Take 1 tablet (40 mg total) by mouth daily. (Patient not taking: Reported on 07/11/2021) 30 tablet 3   furosemide (LASIX) 40 MG tablet Take 1 tablet (40 mg total) by mouth 2 (two) times daily. (Patient not taking: Reported on 07/11/2021) 60 tablet 3   metoprolol succinate (TOPROL-XL) 25 MG 24 hr tablet Take 1 tablet (25 mg total) by mouth daily. (Patient not taking: Reported on 07/11/2021) 30 tablet 3   sacubitril-valsartan (ENTRESTO) 49-51 MG Take 1 tablet by mouth 2 (two) times daily. (Patient not taking: Reported on 07/11/2021) 180 tablet 3   spironolactone (ALDACTONE) 25 MG tablet Take 0.5 tablets (12.5 mg total) by mouth daily. (Patient  not taking: Reported on 07/11/2021) 15 tablet 3    Past Medical History: Past Medical History:  Diagnosis Date   Borderline diabetes    COPD (chronic obstructive pulmonary disease) ([place])    Habitual alcohol use    Tobacco abuse     Social:  Lives - [address] [place],  [place] [address],  Support -unknown Level of function: Independent, Occupation -unknown PCP - Trey Sailors Substance use: Nonprescription/Illicit - denied.  ETOH - none Tobacco -  recent quit  Family History: Family History  Problem Relation Age of Onset    Stroke Maternal Grandmother        ? Possible heart disease too - patient unsure    Allergies: Allergies as of 07/11/2021   (No Known Allergies)    Review of Systems: A complete ROS was negative except as per HPI.   OBJECTIVE:  Physical Exam: Blood pressure 139/86, pulse 77, temperature 97.7 F (36.5 C), temperature source Oral, resp. rate 18, height 5\' 4"  (1.626 m), weight 66.7 kg, SpO2 96 %. Physical Exam Constitutional:      Appearance: She is not ill-appearing or diaphoretic.  HENT:     Head: Normocephalic and atraumatic.     Mouth/Throat:     Mouth: Mucous membranes are moist.  Eyes:     Extraocular Movements: Extraocular movements intact.     Pupils: Pupils are equal, round, and reactive to light.  Neck:     Vascular: JVD present.  Cardiovascular:     Rate and Rhythm: Regular rhythm. Tachycardia present.     Pulses: Normal pulses.     Heart sounds: No murmur heard. Pulmonary:     Effort: Tachypnea present.     Breath sounds: Decreased air movement present.  Abdominal:     General: There is distension.     Tenderness: There is no abdominal tenderness. There is no guarding.  Musculoskeletal:        General: No swelling.     Cervical back: Normal range of motion.  Skin:    General: Skin is warm and dry.  Neurological:     General: No focal deficit present.     Mental Status: She is alert and oriented to person, place, and time.     Pertinent Labs: CBC    Component Value Date/Time   WBC 7.9 07/11/2021 0910   RBC 4.15 07/11/2021 0910   HGB 11.4 (L) 07/11/2021 0910   HCT 36.3 07/11/2021 0910   PLT 242 07/11/2021 0910   MCV 87.5 07/11/2021 0910   MCH 27.5 07/11/2021 0910   MCHC 31.4 07/11/2021 0910   RDW 14.8 07/11/2021 0910   LYMPHSABS 2.5 07/11/2021 0910   MONOABS 0.8 07/11/2021 0910   EOSABS 0.2 07/11/2021 0910   BASOSABS 0.1 07/11/2021 0910     CMP     Component Value Date/Time   NA 138 07/11/2021 0910   NA 138 02/19/2020 1049   K 3.6  07/11/2021 0910   CL 106 07/11/2021 0910   CO2 22 07/11/2021 0910   GLUCOSE 159 (H) 07/11/2021 0910   BUN 9 07/11/2021 0910   BUN 14 02/19/2020 1049   CREATININE 0.91 07/11/2021 0910   CALCIUM 9.2 07/11/2021 0910   PROT 6.5 12/06/2019 1404   ALBUMIN 3.2 (L) 12/06/2019 1404   AST 23 12/06/2019 1404   ALT 16 12/06/2019 1404   ALKPHOS 40 12/06/2019 1404   BILITOT 1.2 12/06/2019 1404   GFRNONAA >60 07/11/2021 0910   GFRAA 74 02/19/2020 1049  Pertinent Imaging: DG Chest Portable 1 View  Result Date: 07/11/2021 CLINICAL DATA:  Shortness of breath. Worsening cough. Labored breathing. EXAM: PORTABLE CHEST 1 VIEW COMPARISON:  12/06/2019 FINDINGS: Cardiomegaly. Aortic atherosclerosis. Pulmonary venous hypertension with interstitial pulmonary edema. Congestive heart failure is favored. There could possibly be coexistence bronchitis or hazy pneumonia. No dense consolidation or lobar collapse. No visible effusion. IMPRESSION: Probable congestive heart failure/pulmonary edema. Electronically Signed   By: Nelson Chimes M.D.   On: 07/11/2021 09:02    EKG: sinus tachycardia  ASSESSMENT & PLAN:  Patient Summary: Emma Hahn is a functionally independent 71 y.o. 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 and admit for CHF exacerbation.   Assessment: Principal Problem:   Acute exacerbation of CHF (congestive heart failure) (HCC)   Plan: CHF exacerbation  Patient presents with signs and symptoms of CHF exacerbation with a significantly elevated BNP of 3547 and pulmonary edema on chest x-ray. She has a history of HFrEF with her last TEE on 11/2019 showing an EF of 25 to 30% with several valvular abnormalities.  Unable to determine diastolic parameters. Right and left heart cath was performed at that time, showing nonischemic cardiomyopathy.   Home meds include metoprolol 25 mg daily, Entresto 49-51 mg twice daily, spironolactone 12.5 mg daily, and furosemide 40 mg twice daily.  Patient has had multiple CHF exacerbations in the previous years with her most recent being attributed to nonadherence to CHF medications since January 2023.  Patient was given 40 mg IV furosemide in the ED with some urine output.  She will likely need another dose of 40 this evening. - Give an additional dose of furosemide 40 mg IV this evening for a total of 80 mg - TTE ordered - Hold off on restarting metoprolol until acute exacerbation has improved. - BIPAP prn for pulmonary edema/dyspnea -We will get repeat electrolytes and monitor daily while diuresing - Will restart Spironolactone and Entresto depending on BP and titrate accordingly  -Strict ins and outs and daily weights - Consider consulting Advanced heart failure team.  Chest pain: Troponinemia: Patient presents with 7-day history of central chest pain that is worse with movement.  Tropes are minimally elevated at 30 repeat pending.  EKG showed sinus tachycardia with left atrial enlargement.  Repeat EKG pending.  No significant lower extremity edema or signs of lower extremity DVT.  Some pleuritic chest pain with deep inspiration and tachycardic.  Stat echocardiogram pending.  Cardiology consulted. -Appreciate cardiology's recommendations. -Repeat troponin pending -Repeat EKG pending -Stat echo pending  COPD: Tobacco use disorder, early remission? Patient on SABA at home. No previous PFTs. No recent history of exacerbations or hospitalizations. Baseline functionality consists of SHOB with walking up and down stairs and walking from room to bathroom. -We will start DuoNebs every 4 hours as needed -Consider initiating Saba and LAMA +/= LABA during this hospitalization. -We will need PFTs in the outpatient setting - Supplemental oxygen to maintain SPO2 between 89 and 93%  HTN Patient has history  of hypertension.  Current blood pressure is 158/99.  This will likely improve with further diuresis.  Will likely need to restart spironolactone and Entresto for heart failure as well as blood pressure -Continue diuresing -Restart spironolactone and Entresto  HLD: Patient apparently holds a diagnosis of hyperlipidemia and has been prescribed atorvastatin 40 mg daily.  Has not been taking this since January 2023. -  Restart atorvastatin 40 mg daily  Prediabetes: Last A1c roughly a year ago was 5.8. - Repeat A1c - CBG monitoring daily    Best Practice: Diet: Cardiac diet IVF: none VTE: enoxaparin (LOVENOX) injection 40 mg Start: 07/11/21 1215 Code: Full Code AB: none Dispo: Inpatient with expected length of stay greater than 2 midnights. Anticipated Discharge Location: Home  Signature: Chari Manning, D.O.  Internal Medicine Resident, PGY-3 [facility]  Pager #: [phone] (If no response, contact on-call pager) 12:11 PM, 07/11/2021   Please contact the on-call pager after 5 pm and on weekends at [phone].

## 2021-07-11 NOTE — TOC Initial Note (Addendum)
Transition of Care [facility]) - Initial/Assessment Note    Patient Details  Name: Emma Hahn MRN: [id number] Date of Birth: Mar 11, 1950  Transition of Care [facility]) CM/SW Contact:    Payton Moder, LCSW Phone Number: 07/11/2021, 4:42 PM  Clinical Narrative:                 HF CSW spoke with Ms. Yetta Barre at bedside and completed a very brief SDOH with Ms. Galica who reported to have some financial concerns as she is living on a fixed income. Patient reported they do have a PCP and they can get to the pharmacy to pick up their medications. Ms. Wilmott reported that she gets Food Stamps and that her daughter pays her phone bill which is with [employer] although her phone is old and it only works when it wants to. Ms. Rylant inquired about the free phone and the CSW informed Ms. Ringstad about the government free phone with [employer] and she would need to apply online for that. Ms. Wiechmann reported that her cable was turned off and that she receives help from the [place] program with her gas bill and she has till July to pay the $130 monthly bill due on the 15th of each month. Ms. Teng reports that her check comes every 3rd week of the month and they are aware of this. Ms. Fess reported that she is not concerned about losing her housing as she pays an extra $100 a month with her rent. Ms. Guzy denied needing any help at the moment but will reach out if needed. CSW provided the patient with the social workers name and position and if anything changes to please reach out so that the CSW can provide support.  CSW will continue to follow throughout discharge.      Barriers to Discharge: Continued Medical Work up   Patient Goals and CMS Choice        Expected Discharge Plan and Services   In-house Referral: Clinical Social Work Discharge Planning Services: CM Consult   Living arrangements for the past 2 months: Apartment                                      Prior Living  Arrangements/Services Living arrangements for the past 2 months: Apartment Lives with:: Self Patient language and need for interpreter reviewed:: Yes Do you feel safe going back to the place where you live?: Yes      Need for Family Participation in Patient Care: No (Comment) Care giver support system in place?: No (comment)   Criminal Activity/Legal Involvement Pertinent to Current Situation/Hospitalization: No - Comment as needed  Activities of Daily Living      Permission Sought/Granted                  Emotional Assessment Appearance:: Appears stated age Attitude/Demeanor/Rapport: Engaged Affect (typically observed): Pleasant Orientation: : Oriented to Self, Oriented to Place, Oriented to  Time, Oriented to Situation   Psych Involvement: No (comment)  Admission diagnosis:  Acute exacerbation of CHF (congestive heart failure) (HCC) [I50.9] Acute on chronic congestive heart failure, unspecified heart failure type [facility]) [I50.9] Patient Active Problem List   Diagnosis Date Noted   Acute exacerbation of CHF (congestive heart failure) (HCC) 07/11/2021   MR (mitral regurgitation) 07/11/2021   Financial difficulties 12/10/2019   Alcohol use 12/09/2019   Acute respiratory failure with hypoxia (HCC) 12/08/2019  Acute on chronic combined systolic and diastolic CHF (congestive heart failure) (HCC) 12/06/2019   Tobacco abuse 12/06/2019   Prolonged QT interval 12/06/2019   Chest pain 12/06/2019   Elevated troponin 12/06/2019   Prediabetes 12/06/2019   Do not resuscitate 12/06/2019   PCP:  Norm Salt, PA Pharmacy:   [facility] [address] [place] - [address] [place] [address] Phone: [phone] Fax: [phone]     Social Determinants of Health (SDOH) Interventions Food Insecurity Interventions: Intervention Not Indicated, Other (Comment) (Pt reports having Food Stamps) Financial Strain Interventions: Other  (Comment) (Pt reports living on a fixed income and having difficulty paying bills, she is getting help from the [place] program for her Gas bill.) Housing Interventions: Intervention Not Indicated Transportation Interventions: Intervention Not Indicated  Readmission Risk Interventions     No data to display          [employer], MSW, LCSW [phone] Heart Failure Social Worker

## 2021-07-11 NOTE — ED Provider Notes (Signed)
[facility] EMERGENCY DEPARTMENT Provider Note   CSN: [id number] Arrival date & time: 07/11/21  4098     History  Chief Complaint  Patient presents with   Shortness of Breath    Emma Hahn is a 71 y.o. female with a past medical history of COPD, CHF, hypertension who presents emergency department complaining of shortness of breath onset 7 days.  Notes that her shortness of breath has been acutely worsening.  Has not taking any of her medications since January 2023.  Notes that she has not taken them because she does not feel the need to be on a bunch of medications at this time.  Patient notes that she just stopped smoking cigarettes 7 days ago due to her increasing shortness of breath and worsening cough (productive with clear sputum).  Has associated central chest heaviness.  For the past 7 days patient has been taking her Entresto.  No meds tried prior to arrival.  Denies fever, nausea, vomiting.   The history is provided by the patient. No language interpreter was used.       Home Medications Prior to Admission medications   Medication Sig Start Date End Date Taking? Authorizing Provider  albuterol (VENTOLIN HFA) 108 (90 Base) MCG/ACT inhaler Inhale 1-2 puffs into the lungs every 6 (six) hours as needed for wheezing or shortness of breath.    [provider]  aspirin EC 81 MG EC tablet Take 1 tablet (81 mg total) by mouth daily. Swallow whole. 12/10/19   Lewie Chamber, MD  atorvastatin (LIPITOR) 40 MG tablet Take 1 tablet (40 mg total) by mouth daily. 05/31/20   Bensimhon, Bevelyn Buckles, MD  furosemide (LASIX) 40 MG tablet Take 1 tablet (40 mg total) by mouth 2 (two) times daily. 05/31/20   Bensimhon, Bevelyn Buckles, MD  metoprolol succinate (TOPROL-XL) 25 MG 24 hr tablet Take 1 tablet (25 mg total) by mouth daily. 05/31/20   Bensimhon, Bevelyn Buckles, MD  nitroGLYCERIN (NITROSTAT) 0.4 MG SL tablet Place 0.4 mg under the tongue as needed for chest pain. 11/10/19    [provider]  sacubitril-valsartan (ENTRESTO) 49-51 MG Take 1 tablet by mouth 2 (two) times daily. 01/03/21   Bensimhon, Bevelyn Buckles, MD  spironolactone (ALDACTONE) 25 MG tablet Take 0.5 tablets (12.5 mg total) by mouth daily. 02/01/20   [name], MD      Allergies    Patient has no known allergies.    Review of Systems   Review of Systems  Constitutional:  Negative for fever.  Respiratory:  Positive for cough and shortness of breath.   Cardiovascular:  Positive for chest pain. Negative for leg swelling.  Gastrointestinal:  Negative for nausea and vomiting.  All other systems reviewed and are negative.   Physical Exam Updated Vital Signs BP (!) 152/93   Pulse (!) 113   Temp 97.7 F (36.5 C) (Oral)   Resp (!) 22   Ht 5\' 4"  (1.626 m)   Wt 66.7 kg   SpO2 97%   BMI 25.23 kg/m  Physical Exam Vitals and nursing note reviewed.  Constitutional:      General: She is not in acute distress.    Appearance: She is not diaphoretic.  HENT:     Head: Normocephalic and atraumatic.     Mouth/Throat:     Pharynx: No oropharyngeal exudate.  Eyes:     General: No scleral icterus.    Conjunctiva/sclera: Conjunctivae normal.  Cardiovascular:     Rate and Rhythm:  Normal rate and regular rhythm.     Pulses: Normal pulses.     Heart sounds: Normal heart sounds.  Pulmonary:     Effort: Pulmonary effort is normal. No respiratory distress.     Breath sounds: Decreased breath sounds present. No wheezing.     Comments: Decreased breath sounds noted throughout lung fields.  Labored breathing on exam. Abdominal:     General: Bowel sounds are normal.     Palpations: Abdomen is soft. There is no mass.     Tenderness: There is no abdominal tenderness. There is no guarding or rebound.  Musculoskeletal:        General: Normal range of motion.     Cervical back: Normal range of motion and neck supple.     Comments: No lower extremity edema noted on exam.  Pedal pulses intact  bilaterally.  Skin:    General: Skin is warm and dry.  Neurological:     Mental Status: She is alert.  Psychiatric:        Behavior: Behavior normal.    ED Results / Procedures / Treatments   Labs (all labs ordered are listed, but only abnormal results are displayed) Labs Reviewed  BASIC METABOLIC PANEL - Abnormal; Notable for the following components:      Result Value   Glucose, Bld 159 (*)    All other components within normal limits  CBC WITH DIFFERENTIAL/PLATELET - Abnormal; Notable for the following components:   Hemoglobin 11.4 (*)    All other components within normal limits  BRAIN NATRIURETIC PEPTIDE - Abnormal; Notable for the following components:   B Natriuretic Peptide 3,547.8 (*)    All other components within normal limits  TROPONIN I (HIGH SENSITIVITY) - Abnormal; Notable for the following components:   Troponin I (High Sensitivity) 29 (*)    All other components within normal limits  MAGNESIUM  PHOSPHORUS  HEMOGLOBIN A1C  TROPONIN I (HIGH SENSITIVITY)    EKG EKG Interpretation  Date/Time:  Tuesday July 11 2021 08:44:02 EDT Ventricular Rate:  106 PR Interval:  128 QRS Duration: 68 QT Interval:  364 QTC Calculation: 483 R Axis:   83 Text Interpretation: Sinus tachycardia with frequent Premature ventricular complexes Possible Left atrial enlargement Cannot rule out Anterior infarct , age undetermined Abnormal ECG When compared with ECG of 21-Apr-2020 11:49, PREVIOUS ECG IS PRESENT Confirmed by Lacretia Leigh ([id number]) on 07/11/2021 9:07:23 AM  Radiology DG Chest Portable 1 View  Result Date: 07/11/2021 CLINICAL DATA:  Shortness of breath. Worsening cough. Labored breathing. EXAM: PORTABLE CHEST 1 VIEW COMPARISON:  12/06/2019 FINDINGS: Cardiomegaly. Aortic atherosclerosis. Pulmonary venous hypertension with interstitial pulmonary edema. Congestive heart failure is favored. There could possibly be coexistence bronchitis or hazy pneumonia. No dense consolidation  or lobar collapse. No visible effusion. IMPRESSION: Probable congestive heart failure/pulmonary edema. Electronically Signed   By: Nelson Chimes M.D.   On: 07/11/2021 09:02    Procedures Procedures    Medications Ordered in ED Medications  enoxaparin (LOVENOX) injection 40 mg (has no administration in time range)  sodium chloride flush (NS) 0.9 % injection 3 mL (has no administration in time range)  acetaminophen (TYLENOL) tablet 650 mg (has no administration in time range)    Or  acetaminophen (TYLENOL) suppository 650 mg (has no administration in time range)  aspirin EC tablet 81 mg (has no administration in time range)  ipratropium-albuterol (DUONEB) 0.5-2.5 (3) MG/3ML nebulizer solution 3 mL (has no administration in time range)  atorvastatin (LIPITOR) tablet 40 mg (  has no administration in time range)  spironolactone (ALDACTONE) tablet 12.5 mg (has no administration in time range)  sacubitril-valsartan (ENTRESTO) 24-26 mg per tablet (has no administration in time range)  furosemide (LASIX) injection 40 mg (has no administration in time range)  ipratropium-albuterol (DUONEB) 0.5-2.5 (3) MG/3ML nebulizer solution 3 mL (3 mLs Nebulization Given 07/11/21 0926)  furosemide (LASIX) injection 40 mg (40 mg Intravenous Given 07/11/21 3810)    ED Course/ Medical Decision Making/ A&P Clinical Course as of 07/11/21 1227  Tue Jul 11, 2021  1034 B Natriuretic Peptide(!): 3,547.8 Treated with lasix in the ED.  [SB]  1042 Re-evaluated and patient resting on stretcher. Pt still with labored breathing during exam.  Breath sounds slightly improved with DuoNeb in the ED.  Discussed with patient plans for admission. Pt agreeable at this time.  [SB]  1118 Consult with hospitalist, Dr. Marchia Bond who will evaluate the patient for admission.   [SB]    Clinical Course User Index [SB] Gaytha Raybourn A, PA-C                            Medical Decision Making Amount and/or Complexity of Data Reviewed Labs:  ordered. Decision-making details documented in ED Course. Radiology: ordered. ECG/medicine tests: ordered.  Risk OTC drugs. Prescription drug management. Decision regarding hospitalization.   Patient presents to the ED complaining of worsening shortness of breath and productive cough (clear sputum) onset 7 days.  He has a history of COPD, CHF, hypertension.  Stopped taking her medications in January 2023.  Patient afebrile, slightly tachycardic and tachypneic on exam.  No hypoxia noted today.  On exam patient with labored breathing, decreased breath sounds noted throughout.  No edema noted to bilateral lower extremities.  Differential diagnosis includes CHF exacerbation, COPD exacerbation, PE, pneumonia, ACS.   Additional history obtained:  External records from outside source obtained and reviewed including: Recent echo on 12/09/2019 showed LVEF of 25 to 30%.  EKG:  Sinus tachycardia with PVCs noted. No acute ST/T changes  Labs:  I ordered, and personally interpreted labs.  The pertinent results include:  BNP elevated at 3547.8 CBC without leukocytosis and unremarkable. BMP with slightly elevated glucose at 159 otherwise unremarkable Initial troponin elevated at 29 (likely in the setting of COPD and CHF)  Imaging: I ordered imaging studies including CXR I independently visualized and interpreted imaging which showed Probable congestive heart failure/pulmonary edema. I agree with the radiologist interpretation  Medications:  I ordered medication including Lasix and DuoNeb for diuresis and breathing treatment. Reevaluation of the patient after these medicines and interventions, I reevaluated the patient and found that they have stayed the same I have reviewed the patients home medicines and have made adjustments as needed   Consultations: I requested consultation with the Hospitalist, Dr. Marchia Bond and discussed lab and imaging findings as well as pertinent plan - they recommend:  admission at this time.   Disposition: Presentation suspicious for COPD and CHF exacerbation at this time. Pneumonia less likely, chest x-ray without focal consolidations. Considered PE, however, given CXR findings and patient appearing fluid overloaded, this seems likely as CHF exacerbation. Concern for CHF exacerbation and further evaluations recommended. Patient reevaluated prior to consult and vital signs stable, in no acute distress, patient resting comfortably on stretcher. BiPAP initiated in the ED prior to admission due to patients persistent labored breathing. After consideration of the diagnostic results and the patients response to treatment, I feel that the patient  would benefit from Admission to the hospital. Discussed admission plans with patient at bedside, patient agreeable at this time. Pt appears safe for admission.    This chart was dictated using voice recognition software, Dragon. Despite the best efforts of this provider to proofread and correct errors, errors may still occur which can change documentation meaning.   Final Clinical Impression(s) / ED Diagnoses Final diagnoses:  Acute on chronic congestive heart failure, unspecified heart failure type [facility])    Rx / DC Orders ED Discharge Orders     None         Adelayde Minney A, PA-C 07/11/21 1602    Lacretia Leigh, MD 07/13/21 [phone]

## 2021-07-11 NOTE — Progress Notes (Signed)
BiPAP patient transported from ED33 to 3E09 without any issues.

## 2021-07-12 DIAGNOSIS — Z720 Tobacco use: Secondary | ICD-10-CM

## 2021-07-12 DIAGNOSIS — Z789 Other specified health status: Secondary | ICD-10-CM | POA: Diagnosis not present

## 2021-07-12 DIAGNOSIS — I5023 Acute on chronic systolic (congestive) heart failure: Secondary | ICD-10-CM | POA: Diagnosis not present

## 2021-07-12 DIAGNOSIS — I340 Nonrheumatic mitral (valve) insufficiency: Secondary | ICD-10-CM | POA: Diagnosis not present

## 2021-07-12 DIAGNOSIS — F17210 Nicotine dependence, cigarettes, uncomplicated: Secondary | ICD-10-CM

## 2021-07-12 LAB — BASIC METABOLIC PANEL
Anion gap: 8 (ref 5–15)
BUN: 11 mg/dL (ref 8–23)
CO2: 27 mmol/L (ref 22–32)
Calcium: 8.9 mg/dL (ref 8.9–10.3)
Chloride: 102 mmol/L (ref 98–111)
Creatinine, Ser: 0.92 mg/dL (ref 0.44–1.00)
GFR, Estimated: >60 mL/min (ref >60)
Glucose, Bld: 104 mg/dL — ABNORMAL HIGH (ref 70–99)
Potassium: 3.8 mmol/L (ref 3.5–5.1)
Sodium: 137 mmol/L (ref 135–145)

## 2021-07-12 LAB — HEMOGLOBIN A1C
Hgb A1c MFr Bld: 5.9 % — ABNORMAL HIGH (ref 4.8–5.6)
Mean Plasma Glucose: 123 mg/dL

## 2021-07-12 LAB — COMPREHENSIVE METABOLIC PANEL
ALT: 18 U/L (ref 0–44)
AST: 26 U/L (ref 15–41)
Albumin: 3.4 g/dL — ABNORMAL LOW (ref 3.5–5.0)
Alkaline Phosphatase: 45 U/L (ref 38–126)
Anion gap: 11 (ref 5–15)
BUN: 10 mg/dL (ref 8–23)
CO2: 26 mmol/L (ref 22–32)
Calcium: 9 mg/dL (ref 8.9–10.3)
Chloride: 102 mmol/L (ref 98–111)
Creatinine, Ser: 0.9 mg/dL (ref 0.44–1.00)
GFR, Estimated: >60 mL/min (ref >60)
Glucose, Bld: 122 mg/dL — ABNORMAL HIGH (ref 70–99)
Potassium: 3.8 mmol/L (ref 3.5–5.1)
Sodium: 139 mmol/L (ref 135–145)
Total Bilirubin: 0.9 mg/dL (ref 0.3–1.2)
Total Protein: 6.9 g/dL (ref 6.5–8.1)

## 2021-07-12 LAB — GLUCOSE, CAPILLARY: Glucose-Capillary: 162 mg/dL — ABNORMAL HIGH (ref 70–99)

## 2021-07-12 MED ORDER — POTASSIUM CHLORIDE CRYS ER 20 MEQ PO TBCR
40.0000 meq | EXTENDED_RELEASE_TABLET | Freq: Once | ORAL | Status: AC
Start: 2021-07-12 — End: 2021-07-12
  Administered 2021-07-12: 40 meq via ORAL
  Filled 2021-07-12: qty 2

## 2021-07-12 MED ORDER — HYDROXYZINE HCL 25 MG PO TABS
25.0000 mg | ORAL_TABLET | Freq: Three times a day (TID) | ORAL | Status: DC | PRN
  Administered 2021-07-13: 25 mg via ORAL
  Filled 2021-07-12: qty 1

## 2021-07-12 MED ORDER — METOLAZONE 2.5 MG PO TABS
2.5000 mg | ORAL_TABLET | Freq: Once | ORAL | Status: AC
Start: 2021-07-12 — End: 2021-07-12
  Administered 2021-07-12: 2.5 mg via ORAL
  Filled 2021-07-12: qty 1

## 2021-07-12 NOTE — Evaluation (Signed)
Occupational Therapy Evaluation Patient Details Name: Emma Hahn MRN: [id number] DOB: April 06, 1950 Today's Date: 07/12/2021   History of Present Illness 71 y.o. female presents to [facility] hospital on 07/11/2021 with SOB, admitted for management of acute on chronic CHF exacerbation. PMH includes HLD, HTN, COPD.   Clinical Impression   Patient admitted for the diagnosis above.  PTA she lives alone, and remains very independent.  Patient stating she has walked the halls, and is not needing any assist.  Patient up and walking to/from bathroom ad lib.  Patient appears to be at her baseline.  No acute OT needs identified, and patient is not needing post acute rehab services.  Encouraged continued mobility with staff.         Recommendations for follow up therapy are one component of a multi-disciplinary discharge planning process, led by the attending physician.  Recommendations may be updated based on patient status, additional functional criteria and insurance authorization.   Follow Up Recommendations  No OT follow up    Assistance Recommended at Discharge None  Patient can return home with the following      Functional Status Assessment  Patient has not had a recent decline in their functional status  Equipment Recommendations  None recommended by OT    Recommendations for Other Services       Precautions / Restrictions Precautions Precautions: None Restrictions Weight Bearing Restrictions: No      Mobility Bed Mobility Overal bed mobility: Independent             General bed mobility comments: light use of siderails    Transfers Overall transfer level: Independent                 General transfer comment: No assist needed, walking to/from restroom ad lib      Balance Overall balance assessment: No apparent balance deficits (not formally assessed)                                         ADL either performed or assessed with clinical judgement    ADL Overall ADL's : At baseline                                             Vision Patient Visual Report: No change from baseline       Perception Perception Perception: Not tested   Praxis Praxis Praxis: Not tested    Pertinent Vitals/Pain Pain Assessment Pain Assessment: No/denies pain     Hand Dominance Right   Extremity/Trunk Assessment Upper Extremity Assessment Upper Extremity Assessment: Overall WFL for tasks assessed   Lower Extremity Assessment Lower Extremity Assessment: Defer to PT evaluation       Communication Communication Communication: No difficulties   Cognition Arousal/Alertness: Awake/alert Behavior During Therapy: WFL for tasks assessed/performed Overall Cognitive Status: Within Functional Limits for tasks assessed                                                        Home Living Family/patient expects to be discharged to:: Private residence Living Arrangements: Alone Available Help at Discharge: Neighbor Type  of Home: House Home Access: Stairs to enter [employer] of Steps: 5 Entrance Stairs-Rails: Right Home Layout: Two level Alternate Level Stairs-Number of Steps: full flight   Bathroom Shower/Tub: Chief Strategy Officer: Standard Bathroom Accessibility: Yes How Accessible: Accessible via walker Home Equipment: None          Prior Functioning/Environment Prior Level of Function : Independent/Modified Independent;Driving               ADLs Comments: No assist for ADL, iADL, medication or bill payment.        OT Problem List: Decreased activity tolerance      OT Treatment/Interventions:      OT Goals(Current goals can be found in the care plan section) Acute Rehab OT Goals Patient Stated Goal: Go home tomorrow after my test OT Goal Formulation: With patient Time For Goal Achievement: 07/17/21 Potential to Achieve Goals: Good  OT Frequency:       Co-evaluation              AM-PAC OT "6 Clicks" Daily Activity     Outcome Measure Help from another person eating meals?: None Help from another person taking care of personal grooming?: None Help from another person toileting, which includes using toliet, bedpan, or urinal?: None Help from another person bathing (including washing, rinsing, drying)?: None Help from another person to put on and taking off regular upper body clothing?: None Help from another person to put on and taking off regular lower body clothing?: None 6 Click Score: 24   End of Session Nurse Communication: Mobility status  Activity Tolerance: Patient tolerated treatment well Patient left: in bed;with call bell/phone within reach  OT Visit Diagnosis: Other (comment) (Shortness of breath)                Time: 8916-9450 OT Time Calculation (min): 17 min Charges:  OT General Charges $OT Visit: 1 Visit OT Evaluation $OT Eval Moderate Complexity: 1 Mod  07/12/2021  RP, OTR/L  Acute Rehabilitation Services  Office:  [phone]   Suzanna Obey 07/12/2021, 1:37 PM

## 2021-07-12 NOTE — Progress Notes (Signed)
Mobility Specialist Progress Note:   07/12/21 1439  Mobility  Activity Refused mobility   Pt about to take a nap. Will follow-up as time allows.   [facility] Nyhla [place]joy Mobility Specialist

## 2021-07-12 NOTE — Evaluation (Signed)
Physical Therapy Evaluation Patient Details Name: Emma Hahn MRN: [id number] DOB: 12-21-1950 Today's Date: 07/12/2021  History of Present Illness  71 y.o. female presents to [facility] hospital on 07/11/2021 with SOB, admitted for management of acute on chronic CHF exacerbation. PMH includes HLD, HTN, COPD.  Clinical Impression  Pt presents to PT at or near her functional baseline. Pt reports feeling improved since date of admission, with improved respiratory status. Pt is able to mobilize independently at this time and demonstrates the ability to perform all mobility required within the home. Pt is eager to discharge and has no further acute PT needs. Acute PT encourages the pt to mobilize frequently for the remainder of this admission. Acute PT singing off.     Recommendations for follow up therapy are one component of a multi-disciplinary discharge planning process, led by the attending physician.  Recommendations may be updated based on patient status, additional functional criteria and insurance authorization.  Follow Up Recommendations No PT follow up      Assistance Recommended at Discharge None  Patient can return home with the following       Equipment Recommendations    Recommendations for Other Services       Functional Status Assessment Patient has not had a recent decline in their functional status     Precautions / Restrictions Precautions Precautions: None Restrictions Weight Bearing Restrictions: No      Mobility  Bed Mobility Overal bed mobility: Modified Independent             General bed mobility comments: HOB elevated    Transfers Overall transfer level: Independent                      Ambulation/Gait Ambulation/Gait assistance: Independent Gait Distance (Feet): 300 Feet Assistive device: None Gait Pattern/deviations: WFL(Within Functional Limits) Gait velocity: functional Gait velocity interpretation: 1.31 - 2.62 ft/sec, indicative of  limited community ambulator   General Gait Details: steady step-through gait  Stairs Stairs: Yes Stairs assistance: Modified independent (Device/Increase time) Stair Management: One rail Left, Alternating pattern Number of Stairs: 10    Wheelchair Mobility    Modified Rankin (Stroke Patients Only)       Balance Overall balance assessment: Independent                                           Pertinent Vitals/Pain Pain Assessment Pain Assessment: No/denies pain    Home Living Family/patient expects to be discharged to:: Private residence Living Arrangements: Alone Available Help at Discharge:  (none identified) Type of Home: House Home Access: Stairs to enter Entrance Stairs-Rails: Right Entrance Stairs-Number of Steps: 5 Alternate Level Stairs-Number of Steps: 12 Home Layout: Two level Home Equipment: None      Prior Function Prior Level of Function : Independent/Modified Independent;Driving                     Hand Dominance        Extremity/Trunk Assessment   Upper Extremity Assessment Upper Extremity Assessment: Overall WFL for tasks assessed    Lower Extremity Assessment Lower Extremity Assessment: Overall WFL for tasks assessed    Cervical / Trunk Assessment Cervical / Trunk Assessment: Normal  Communication   Communication: No difficulties  Cognition Arousal/Alertness: Awake/alert Behavior During Therapy: WFL for tasks assessed/performed Overall Cognitive Status: Within Functional Limits for tasks assessed  General Comments General comments (skin integrity, edema, etc.): pt with brief period of desaturation to 89% after ascending 10 steps, sats recover within 15 seconds to 92%. Otherwise VSS    Exercises     Assessment/Plan    PT Assessment Patient does not need any further PT services  PT Problem List         PT Treatment Interventions      PT  Goals (Current goals can be found in the Care Plan section)       Frequency       Co-evaluation               AM-PAC PT "6 Clicks" Mobility  Outcome Measure Help needed turning from your back to your side while in a flat bed without using bedrails?: None Help needed moving from lying on your back to sitting on the side of a flat bed without using bedrails?: None Help needed moving to and from a bed to a chair (including a wheelchair)?: None Help needed standing up from a chair using your arms (e.g., wheelchair or bedside chair)?: None Help needed to walk in hospital room?: None Help needed climbing 3-5 steps with a railing? : None 6 Click Score: 24    End of Session   Activity Tolerance: Patient tolerated treatment well Patient left: in bed;with call bell/phone within reach Nurse Communication: Mobility status      Time: 9509-3267 PT Time Calculation (min) (ACUTE ONLY): 17 min   Charges:   PT Evaluation $PT Eval Low Complexity: 1 Low          Arlyss Gandy, PT, DPT Acute Rehabilitation Office [phone]   Arlyss Gandy 07/12/2021, 9:02 AM

## 2021-07-12 NOTE — Hospital Course (Addendum)
71 y.o. female with a pertinent PMH of HFrEF from nonischemic cardiomyopathy, hyperlipidemia, hypertension, prediabetes, COPD, tobacco and alcohol use disorder, who presents to [facility] with shortness of breath and chest pain. Pt has been on several medications in the past for her CHF but recently discontinued all of her medications in January 2023. Pt with history of medication noncompliance in which pt reports she is old and does not to spend her final years taking multiple pills. Since pt has noticed increased shortness of breath, decreased exercise tolerance, orthopnea, abdominal bloating. She has also developed chest pain that is central and constant over the last 7 days and worse with exertion. Denies any improvement with rest, worse with deep breath and cough but not tender to palpation.  She states that she quit smoking roughly 7 days ago but otherwise has a longstanding history of tobacco use disorder. Pt currently drinking 4 airplane bottles of vodka daily. Patient was previously seen by the heart failure team roughly a year ago at that time had TEE that showed pretty significant HFrEF with a 25 to 30% EF with severe MR. Patient had a right and left heart cath with clear coronary arteries. Pt with mitral leaflet tethering in which she refused clip in 2021. Pt willing to restart prior medications but does not want any procedures or new medications.  Pt with elevated trops in ER to 29 and 26 likely due to heart strain. BNP greater than 3,500. A1c 5.9 stable from a year ago. Hemoglobin 11.4. Creatinine bump to high of 1.49, GFR of 37, and BUN of 31 following over diuresis. CXR showed pulmonary edema. Echo showing EF ~45%, small pericardial effusion, severe MR, mild-moderate TR, LVH, left atrial enlargement. cMRI showed severe systolic dysfunction. Normal RV size with moderate systolic dysfunction. Septal scar pattern in nonishemic cardiomyopathies and associated with a worse prognosis. RV insertion scar pattern  in setting of elevated pulmonary pressures.  Pt found to have JVP, tachycardia, PMI nondisplaced, tachypnea with crackles, abdominal distension, and 1+ edema in LE upon presentation. Same improved with fluid volume reduction.  Pt volume overload treated with Lasix, Metolazone, August Albino which has since been held upon signs acute AKI. Pt adamant to be discharged despite kidney function. Will have close follow up with PCP on Monday to reassess kidney function and to consider timing to start Entresto and Lasix. Pt only willing to take two medications for her heart failure at this time.

## 2021-07-12 NOTE — Progress Notes (Signed)
Patient refused BIPAP.  No distress noted at this time.  RT will continue to monitor.

## 2021-07-12 NOTE — Progress Notes (Addendum)
Advanced Heart Failure Rounding Note  PCP-Cardiologist: [name], MD   Subjective:    1.9L UOP + 1 unmeasured void with IV lasix yesterday.   ? Bed weight down 10 lb.  Scr stable 0.90.  K 3.8.  Appears dyspneic during conversation. Continues to decline any titration of GDMT. Now agrees to cMRI.    Objective:   Weight Range: 62.9 kg Body mass index is 23.8 kg/m.   Vital Signs:   Temp:  [97.5 F (36.4 C)-98 F (36.7 C)] 97.5 F (36.4 C) (06/28 0754) Pulse Rate:  [74-103] 91 (06/28 0754) Resp:  [14-25] 18 (06/28 0754) BP: (115-151)/(68-102) 133/89 (06/28 0754) SpO2:  [96 %-100 %] 99 % (06/28 0754) FiO2 (%):  [40 %] 40 % (06/27 1335) Weight:  [62.9 kg-67.3 kg] 62.9 kg (06/28 0415) Last BM Date : 07/11/21  Weight change: Filed Weights   07/11/21 0844 07/11/21 1513 07/12/21 0415  Weight: 66.7 kg 67.3 kg 62.9 kg    Intake/Output:   Intake/Output Summary (Last 24 hours) at 07/12/2021 0944 Last data filed at 07/12/2021 0419 Gross per 24 hour  Intake 348.06 ml  Output 1900 ml  Net -1551.94 ml      Physical Exam    General:  Lying comfortably in bed. HEENT: Normal Neck: Supple. JVP to jaw. Carotids 2+ bilat; no bruits.  Cor: PMI nondisplaced. Regular rate & rhythm. No rubs, gallops or murmurs. Lungs: + bibasilar crackles Abdomen: Soft, nontender, nondistended.  Extremities: No cyanosis, clubbing, rash, 1+ edema Neuro: Alert & orientedx3, cranial nerves grossly intact. moves all 4 extremities w/o difficulty. Affect pleasant   Telemetry   SR 90s  Labs    CBC Recent Labs    07/11/21 0910  WBC 7.9  NEUTROABS 4.3  HGB 11.4*  HCT 36.3  MCV 87.5  PLT 242   Basic Metabolic Panel Recent Labs    61/60/73 0910 07/11/21 1258 07/12/21 0305  NA 138  --  139  K 3.6  --  3.8  CL 106  --  102  CO2 22  --  26  GLUCOSE 159*  --  122*  BUN 9  --  10  CREATININE 0.91  --  0.90  CALCIUM 9.2  --  9.0  MG  --  1.7  --   PHOS  --  3.7  --     Liver Function Tests Recent Labs    07/12/21 0305  AST 26  ALT 18  ALKPHOS 45  BILITOT 0.9  PROT 6.9  ALBUMIN 3.4*   No results for input(s): "LIPASE", "AMYLASE" in the last 72 hours. Cardiac Enzymes No results for input(s): "CKTOTAL", "CKMB", "CKMBINDEX", "TROPONINI" in the last 72 hours.  BNP: BNP (last 3 results) Recent Labs    07/11/21 0910  BNP 3,547.8*    ProBNP (last 3 results) No results for input(s): "PROBNP" in the last 8760 hours.   D-Dimer No results for input(s): "DDIMER" in the last 72 hours. Hemoglobin A1C Recent Labs    07/11/21 1258  HGBA1C 5.9*   Fasting Lipid Panel No results for input(s): "CHOL", "HDL", "LDLCALC", "TRIG", "CHOLHDL", "LDLDIRECT" in the last 72 hours. Thyroid Function Tests No results for input(s): "TSH", "T4TOTAL", "T3FREE", "THYROIDAB" in the last 72 hours.  Invalid input(s): "FREET3"  Other results:   Imaging    ECHOCARDIOGRAM COMPLETE  Result Date: 07/11/2021    ECHOCARDIOGRAM REPORT   Patient Name:   Emma Hahn Date of Exam: 07/11/2021 Medical Rec #:  [id number]  Height:       64.0 in Accession #:    [id number]   Weight:       147.0 lb Date of Birth:  [birth year]    BSA:          1.716 m Patient Age:    71 years     BP:           139/86 mmHg Patient Gender: F            HR:           96 bpm. Exam Location:  Inpatient Procedure: 2D Echo, Cardiac Doppler and Color Doppler STAT ECHO Indications:    CHF  History:        Patient has prior history of Echocardiogram examinations. CHF;                 Risk Factors:Current Smoker.  Sonographer:    Cleatis Polka Referring Phys: [phone] ANTHONY ALLEN IMPRESSIONS  1. Left ventricular ejection fraction, by estimation, is 30 to 35%. The left ventricle has moderately decreased function. The left ventricle demonstrates regional wall motion abnormalities (see scoring diagram/findings for description). Left ventricular  diastolic parameters are consistent with Grade II diastolic dysfunction  (pseudonormalization). Elevated left atrial pressure. There is global hypokinesis, with possible disproportionately severe hypokinesis of the basal inferolateral wall.  2. Right ventricular systolic function is mildly reduced. The right ventricular size is normal. There is moderately elevated pulmonary artery systolic pressure. The estimated right ventricular systolic pressure is 58.4 mmHg.  3. Left atrial size was moderately dilated.  4. A small pericardial effusion is present. The pericardial effusion is circumferential. There is no evidence of cardiac tamponade.  5. The mitral valve is abnormal. Moderate to severe mitral valve regurgitation.  6. Tricuspid valve regurgitation is mild to moderate.  7. The aortic valve is tricuspid. Aortic valve regurgitation is not visualized. No aortic stenosis is present.  8. The inferior vena cava is normal in size with greater than 50% respiratory variability, suggesting right atrial pressure of 3 mmHg. Comparison(s): No significant change from prior study. Prior images reviewed side by side. FINDINGS  Left Ventricle: Left ventricular ejection fraction, by estimation, is 30 to 35%. The left ventricle has moderately decreased function. The left ventricle demonstrates regional wall motion abnormalities. The left ventricular internal cavity size was normal in size. There is no left ventricular hypertrophy. Left ventricular diastolic parameters are consistent with Grade II diastolic dysfunction (pseudonormalization). Elevated left atrial pressure.  LV Wall Scoring: There is global hypokinesis, with possible disproportionately severe hypokinesis of the basal inferolateral wall. Right Ventricle: The right ventricular size is normal. No increase in right ventricular wall thickness. Right ventricular systolic function is mildly reduced. There is moderately elevated pulmonary artery systolic pressure. The tricuspid regurgitant velocity is 3.72 m/s, and with an assumed right atrial  pressure of 3 mmHg, the estimated right ventricular systolic pressure is 58.4 mmHg. Left Atrium: Left atrial size was moderately dilated. Right Atrium: Right atrial size was normal in size. Pericardium: A small pericardial effusion is present. The pericardial effusion is circumferential. There is no evidence of cardiac tamponade. Mitral Valve: There is no mitral valve prolapse. The MR appears to be due to malcoaptation secondary to relative tethering of the posterior leaflet. The mitral valve is abnormal. There is mild thickening of the mitral valve leaflet(s). Moderate to severe  mitral valve regurgitation, with eccentric posteriorly directed jet. Tricuspid Valve: The tricuspid valve is normal in structure. Tricuspid valve regurgitation is  mild to moderate. Aortic Valve: The aortic valve is tricuspid. Aortic valve regurgitation is not visualized. No aortic stenosis is present. Aortic valve peak gradient measures 6.4 mmHg. Pulmonic Valve: The pulmonic valve was grossly normal. Pulmonic valve regurgitation is trivial. Aorta: The aortic root and ascending aorta are structurally normal, with no evidence of dilitation. Venous: The inferior vena cava is normal in size with greater than 50% respiratory variability, suggesting right atrial pressure of 3 mmHg. IAS/Shunts: No atrial level shunt detected by color flow Doppler. Additional Comments: There is pleural effusion in the left lateral region.  LEFT VENTRICLE PLAX 2D LVIDd:         5.20 cm      Diastology LVIDs:         4.30 cm      LV e' medial:    4.35 cm/s LV PW:         1.20 cm      LV E/e' medial:  29.4 LV IVS:        1.00 cm      LV e' lateral:   6.09 cm/s LVOT diam:     2.00 cm      LV E/e' lateral: 21.0 LV SV:         34 LV SV Index:   20 LVOT Area:     3.14 cm  LV Volumes (MOD) LV vol d, MOD A2C: 149.0 ml LV vol d, MOD A4C: 119.0 ml LV vol s, MOD A2C: 81.5 ml LV vol s, MOD A4C: 61.0 ml LV SV MOD A2C:     67.5 ml LV SV MOD A4C:     119.0 ml LV SV MOD BP:       61.9 ml RIGHT VENTRICLE            IVC RV Basal diam:  4.00 cm    IVC diam: 1.60 cm RV Mid diam:    3.00 cm RV S prime:     9.36 cm/s TAPSE (M-mode): 1.5 cm LEFT ATRIUM             Index        RIGHT ATRIUM           Index LA diam:        4.60 cm 2.68 cm/m   RA Area:     17.00 cm LA Vol (A2C):   76.5 ml 44.57 ml/m  RA Volume:   44.90 ml  26.16 ml/m LA Vol (A4C):   65.4 ml 38.10 ml/m LA Biplane Vol: 70.9 ml 41.31 ml/m  AORTIC VALVE AV Area (Vmax): 2.23 cm AV Vmax:        126.00 cm/s AV Peak Grad:   6.4 mmHg LVOT Vmax:      89.50 cm/s LVOT Vmean:     66.100 cm/s LVOT VTI:       0.108 m  AORTA Ao Root diam: 2.70 cm Ao Asc diam:  2.80 cm MITRAL VALVE                TRICUSPID VALVE MV Area (PHT): 5.34 cm     TR Peak grad:   55.4 mmHg MV Decel Time: 142 msec     TR Vmax:        372.00 cm/s MR Peak grad: 118.4 mmHg MR Mean grad: 92.0 mmHg     SHUNTS MR Vmax:      544.00 cm/s   Systemic VTI:  0.11 m MR Vmean:     461.0 cm/s  Systemic Diam: 2.00 cm MV E velocity: 128.00 cm/s MV A velocity: 85.10 cm/s MV E/A ratio:  1.50 Mihai Croitoru MD Electronically signed by Sanda Klein MD Signature Date/Time: 07/11/2021/2:42:59 PM    Final      Medications:     Scheduled Medications:  aspirin EC  81 mg Oral Daily   atorvastatin  40 mg Oral Daily   enoxaparin (LOVENOX) injection  40 mg Subcutaneous Daily   furosemide  80 mg Intravenous BID   sacubitril-valsartan  2 tablet Oral BID   sodium chloride flush  3 mL Intravenous Q12H   spironolactone  12.5 mg Oral Daily   traZODone  100 mg Oral QHS    Infusions:   PRN Medications: acetaminophen **OR** acetaminophen, hydrOXYzine, ipratropium-albuterol    Patient Profile   71 year old female with history of ETOH abuse, tobacco abuse, MR, COPD, NICM, and HFrEF.   Admitted with A/C HFmEF in the setting of medication noncompliance.     Assessment/Plan   1. A/C HFmEF -NICM in the setting of severe MR and ETOH abuse.  -Echo this admit LVEF ~ 45% which is  up from previous EF 25-30% back in 2021.  MR remains severe. She has declined mitral clip. -Previously declined cMRI. But now willing to proceed. Plan for tomorrow once she is better diuresed. -Now presents NYHA IV in the setting of medication noncompliance. She had stopped all meds 6 months ago. She does want to take medications but is willing to take diuretics.  - BNP > 3500 in setting of left atrial enlargement/severe MR.  - Remains volume overloaded. Continue IV lasix 80 BID and give 2.5 mg metolazone. - Can continue spiro 12.5 mg daily.  - For now would avoid adding other medications because is adamant she wont take them.  - Follow renal function.    2. MR, Severe -Evaluated back in 2021 for mitral clip however she did not want to pursue.  -Echo this admit redemonstrated severe MR    3. ETOH Abuse -At least 4 drinks a day -Watch for withdrawl   4. Tobacco Abuse.  -Reports she quit 8 days ago.      Length of Stay: 1  FINCH, LINDSAY N, PA-C  07/12/2021, 9:44 AM  Advanced Heart Failure Team Pager [phone] (M-F; 7a - 5p)  Please contact [facility] for night-coverage after hours (5p -7a ) and weekends on [email or web address]    Patient seen and examined with the above-signed Advanced Practice Provider and/or Housestaff. I personally reviewed laboratory data, imaging studies and relevant notes. I independently examined the patient and formulated the important aspects of the plan. I have edited the note to reflect any of my changes or salient points. I have personally discussed the plan with the patient and/or family.  Remains SOB. Diuresing with IV lasix. Denies orthopnea or PND.   General:  Lying in bed + dyspnea HEENT: normal Neck: supple. JVP up Carotids 2+ bilat; no bruits. No lymphadenopathy or thryomegaly appreciated. Cor: PMI nondisplaced. Regular rate & rhythm. 2/6 MR Lungs: clear Abdomen: soft, nontender, nondistended. No hepatosplenomegaly. No bruits or masses. Good bowel  sounds. Extremities: no cyanosis, clubbing, rash, edema Neuro: alert & orientedx3, cranial nerves grossly intact. moves all 4 extremities w/o difficulty. Affect pleasant  She remains volume overloaded. Will continue IV lasix. Titrate meds as she will permit. Plan cMRI. She refuses and MV interventions.   Glori Bickers, MD  12:25 PM

## 2021-07-12 NOTE — Progress Notes (Signed)
Subjective:  Today, 06/28 Pt feels complete resolution of chest pain. No chest pressure. Pt feels breathing is much better today and no longer needing oxygen. She feels improved and would like to go home. She did not sleep well due to hospital bed feeling uncomfortable. Pt willing to stay another day to complete cMRI inpatient tomorrow.  Objective:  Vital signs in last 24 hours: Vitals:   07/12/21 0022 07/12/21 0415 07/12/21 0754 07/12/21 1029  BP: 126/75 115/73 133/89 111/66  Pulse: 90 74 91 88  Resp: 18 18 18 20   Temp: 97.6 F (36.4 C)  (!) 97.5 F (36.4 C) (!) 97.5 F (36.4 C)  TempSrc: Oral  Oral Oral  SpO2: 96% 96% 99% 92%  Weight:  62.9 kg    Height:       Weight change:   Intake/Output Summary (Last 24 hours) at 07/12/2021 1201 Last data filed at 07/12/2021 1037 Gross per 24 hour  Intake 351.06 ml  Output 2350 ml  Net -1998.94 ml   General:  Lying comfortably in bed. HEENT: Normal Neck: Supple. No JVP appreciated Cor: PMI nondisplaced. Regular rate & rhythm. No rubs, gallops or murmurs. Lungs: Lungs clear to ausculation  Abdomen: Soft, nontender, nondistended.  Extremities: No cyanosis, clubbing, rash, no edema Neuro: Alert & orientedx3, cranial nerves grossly intact. moves all 4 extremities w/o difficulty. Affect pleasant  Assessment/Plan:  Principal Problem:   Acute exacerbation of CHF (congestive heart failure) (HCC) Active Problems:   Tobacco abuse   Alcohol use   MR (mitral regurgitation) Patient Summary:  71 y.o. female with a pertinent PMH of HFrEF from nonischemic cardiomyopathy, hyperlipidemia, hypertension, prediabetes, COPD, tobacco and alcohol use disorder, who presents to [facility] with progressive shortness of breath and chest pain in the setting of medication noncompliance admitted for CHF exacerbation.  1. A/C HFmEF NICM in the setting of severe MR.  Echo today LVEF now ~ 45% which is up from previous EF 25-30% back in 2021. Evaluated back in  2021 for mitral clip however she did not want to pursue. MR remains severe. Pt to get Cardiac MRI tomorrow. Now presents NYHA IV in the setting of medication noncompliance; stopped all meds 6 months ago. She does want to take medications but is willing to take Lasix and Entresto.  -Appreciate Heart Failure Teams recommendations - BNP > 3500 in setting of left atrial enlargement/severe MR.  - Pt close to euvolemia. Continue to diurese with IV lasix to decongest. Increase lasix to 80 mg BID and give 2.5 mg Metolazone. Will need lasix and Entresto dosing prior to DC. - Can continue spiro 12.5 mg daily.  - For now would avoid adding other medications because is adamant she wont take them.  - Follow renal function.  -Strict ins and outs and daily weights  2. Chest Pain: Troponinemia: Patient presents with 7-day history of central chest pain that is worse with movement. Tropes are minimally elevated at 29 with repeat at 26. EKG showed sinus tachycardia with left atrial enlargement. No significant lower extremity edema or signs of lower extremity DVT. Some pleuritic chest pain with deep inspiration and tachycardic.  -Appreciate cardiology's recommendations. -Stat echo showing ongoing severe MR and EF of ~45%   3. COPD Tobacco use disorder, early remission? Patient on SABA at home. No previous PFTs. No recent history of exacerbations or hospitalizations. Baseline functionality consists of SHOB with walking up and down stairs and walking from room to bathroom. Sustained from tobacco use for the past 7  days. -We will start DuoNebs every 4 hours as needed -Consider initiating Saba and LAMA +/= LABA during this hospitalization. -We will need PFTs in the outpatient setting - Supplemental oxygen at to maintain SPO2 between 89 and 93%. Pt currently on RA at 98%  4. Prediabetes Last A1c a year ago was 5.8 Continue CBG monitoring daily Current A1c 5.9  5. HLD -Restarted Lipitor 40mg  daily  6.  HTN Current BP 127/83 which is improved from 158/99 with further diuresis. -Restarted spiro 12.5mg  and Entresto for heart failure and BP   7. ETOH Abuse Currently at 4 drinks a day of vodka with last drink Monday 06/26 -Watch for withdrawal   TOC SW working with patient for financial concerns.   LOS: 1 day   7/26, Medical Student 07/12/2021, 12:01 PM

## 2021-07-13 ENCOUNTER — Inpatient Hospital Stay (HOSPITAL_COMMUNITY): Payer: Medicare HMO

## 2021-07-13 DIAGNOSIS — N179 Acute kidney failure, unspecified: Secondary | ICD-10-CM

## 2021-07-13 DIAGNOSIS — I3139 Other pericardial effusion (noninflammatory): Secondary | ICD-10-CM

## 2021-07-13 DIAGNOSIS — I509 Heart failure, unspecified: Secondary | ICD-10-CM | POA: Diagnosis not present

## 2021-07-13 DIAGNOSIS — I340 Nonrheumatic mitral (valve) insufficiency: Secondary | ICD-10-CM | POA: Diagnosis not present

## 2021-07-13 DIAGNOSIS — I5023 Acute on chronic systolic (congestive) heart failure: Secondary | ICD-10-CM | POA: Diagnosis not present

## 2021-07-13 DIAGNOSIS — F17210 Nicotine dependence, cigarettes, uncomplicated: Secondary | ICD-10-CM | POA: Diagnosis not present

## 2021-07-13 LAB — BASIC METABOLIC PANEL
Anion gap: 10 (ref 5–15)
BUN: 23 mg/dL (ref 8–23)
CO2: 25 mmol/L (ref 22–32)
Calcium: 8.8 mg/dL — ABNORMAL LOW (ref 8.9–10.3)
Chloride: 99 mmol/L (ref 98–111)
Creatinine, Ser: 1.49 mg/dL — ABNORMAL HIGH (ref 0.44–1.00)
GFR, Estimated: 37 mL/min — ABNORMAL LOW (ref >60)
Glucose, Bld: 163 mg/dL — ABNORMAL HIGH (ref 70–99)
Potassium: 4.2 mmol/L (ref 3.5–5.1)
Sodium: 134 mmol/L — ABNORMAL LOW (ref 135–145)

## 2021-07-13 LAB — GLUCOSE, CAPILLARY: Glucose-Capillary: 173 mg/dL — ABNORMAL HIGH (ref 70–99)

## 2021-07-13 LAB — MAGNESIUM: Magnesium: 2.3 mg/dL (ref 1.7–2.4)

## 2021-07-13 MED ORDER — SACUBITRIL-VALSARTAN 24-26 MG PO TABS: 2.0000 | ORAL_TABLET | Freq: Two times a day (BID) | ORAL | Status: DC

## 2021-07-13 MED ORDER — POLYETHYLENE GLYCOL 3350 17 G PO PACK
17.0000 g | PACK | Freq: Every day | ORAL | Status: DC
  Administered 2021-07-13 – 2021-07-14 (×2): 17 g via ORAL
  Filled 2021-07-13 (×2): qty 1

## 2021-07-13 MED ORDER — SPIRONOLACTONE 12.5 MG HALF TABLET: 12.5000 mg | ORAL_TABLET | Freq: Every day | ORAL | Status: DC

## 2021-07-13 MED ORDER — FUROSEMIDE 10 MG/ML IJ SOLN: 80.0000 mg | Freq: Two times a day (BID) | INTRAMUSCULAR | Status: DC

## 2021-07-13 MED ORDER — GADOBUTROL 1 MMOL/ML IV SOLN
9.0000 mL | Freq: Once | INTRAVENOUS | Status: AC | PRN
  Administered 2021-07-13: 9 mL via INTRAVENOUS

## 2021-07-13 NOTE — Progress Notes (Signed)
Patient refused for RN to assessed patient's back and sacrum. "All I want is to sleep, leave me alone. I will let you know when I want my medicine." RN educated patient on med refusal and the importance of all meds scheduled. Patient verbalize understanding. Told RN, "put a sign on my door, I do not want to be disturb".

## 2021-07-13 NOTE — Progress Notes (Addendum)
Advanced Heart Failure Rounding Note  PCP-Cardiologist: [name], MD   Subjective:    cMRI completed today. Interpretation pending.   Tolerating MRI ok. No orthopnea.   Denies dyspnea.   Has AKI, SCr bump 0.92>>1.49. SPBs low 90s.   20 beat run of VT on tele ~2AM. No palpitations but reported nausea/vomiting around that time.  K 4.2 Mg 2.3   Objective:   Weight Range: 64.2 kg Body mass index is 24.29 kg/m.   Vital Signs:   Temp:  [97.5 F (36.4 C)-98.3 F (36.8 C)] 98.3 F (36.8 C) (06/29 0515) Pulse Rate:  [44-102] 70 (06/29 0530) Resp:  [9-26] 17 (06/29 0515) BP: (91-112)/(51-74) 91/51 (06/29 0515) SpO2:  [91 %-100 %] 98 % (06/29 0530) Weight:  [64.2 kg] 64.2 kg (06/29 0024) Last BM Date : 07/11/21  Weight change: Filed Weights   07/11/21 1513 07/12/21 0415 07/13/21 0024  Weight: 67.3 kg 62.9 kg 64.2 kg    Intake/Output:   Intake/Output Summary (Last 24 hours) at 07/13/2021 0839 Last data filed at 07/13/2021 0041 Gross per 24 hour  Intake 843 ml  Output 1450 ml  Net -607 ml      Physical Exam    General:  Well appearing. No respiratory difficulty HEENT: normal Neck: supple. no JVD. Carotids 2+ bilat; no bruits. No lymphadenopathy or thyromegaly appreciated. Cor: PMI nondisplaced. Regular rate & rhythm. +26 MR murmur. Lungs: clear Abdomen: soft, nontender, nondistended. No hepatosplenomegaly. No bruits or masses. Good bowel sounds. Extremities: no cyanosis, clubbing, rash, edema Neuro: alert & oriented x 3, cranial nerves grossly intact. moves all 4 extremities w/o difficulty. Affect pleasant.    Telemetry   SR 90s, 20 beat run of VT ~2 AM   Labs    CBC Recent Labs    07/11/21 0910  WBC 7.9  NEUTROABS 4.3  HGB 11.4*  HCT 36.3  MCV 87.5  PLT 242   Basic Metabolic Panel Recent Labs    56/43/32 1258 07/12/21 0305 07/12/21 1153 07/13/21 0639  NA  --    < > 137 134*  K  --    < > 3.8 4.2  CL  --    < > 102 99   CO2  --    < > 27 25  GLUCOSE  --    < > 104* 163*  BUN  --    < > 11 23  CREATININE  --    < > 0.92 1.49*  CALCIUM  --    < > 8.9 8.8*  MG 1.7  --   --  2.3  PHOS 3.7  --   --   --    < > = values in this interval not displayed.   Liver Function Tests Recent Labs    07/12/21 0305  AST 26  ALT 18  ALKPHOS 45  BILITOT 0.9  PROT 6.9  ALBUMIN 3.4*   No results for input(s): "LIPASE", "AMYLASE" in the last 72 hours. Cardiac Enzymes No results for input(s): "CKTOTAL", "CKMB", "CKMBINDEX", "TROPONINI" in the last 72 hours.  BNP: BNP (last 3 results) Recent Labs    07/11/21 0910  BNP 3,547.8*    ProBNP (last 3 results) No results for input(s): "PROBNP" in the last 8760 hours.   D-Dimer No results for input(s): "DDIMER" in the last 72 hours. Hemoglobin A1C Recent Labs    07/11/21 1258  HGBA1C 5.9*   Fasting Lipid Panel No results for input(s): "CHOL", "HDL", "LDLCALC", "TRIG", "CHOLHDL", "  LDLDIRECT" in the last 72 hours. Thyroid Function Tests No results for input(s): "TSH", "T4TOTAL", "T3FREE", "THYROIDAB" in the last 72 hours.  Invalid input(s): "FREET3"  Other results:   Imaging    No results found.   Medications:     Scheduled Medications:  aspirin EC  81 mg Oral Daily   atorvastatin  40 mg Oral Daily   enoxaparin (LOVENOX) injection  40 mg Subcutaneous Daily   furosemide  80 mg Intravenous BID   sacubitril-valsartan  2 tablet Oral BID   sodium chloride flush  3 mL Intravenous Q12H   spironolactone  12.5 mg Oral Daily   traZODone  100 mg Oral QHS    Infusions:   PRN Medications: acetaminophen **OR** acetaminophen, hydrOXYzine, ipratropium-albuterol    Patient Profile   71 year old female with history of ETOH abuse, tobacco abuse, MR, COPD, NICM, and HFrEF.   Admitted with A/C HFmEF in the setting of medication noncompliance.     Assessment/Plan   1. A/C HFmEF -NICM in the setting of severe MR and ETOH abuse.  -Now presents  NYHA IV in the setting of medication noncompliance. She had stopped all meds 6 months ago. She does want to take medications but is willing to take diuretics.  - BNP > 3500 in setting of left atrial enlargement/severe MR.  -Echo this admit LVEF ~ 45% which is up from previous EF 25-30% back in 2021.  MR remains severe. She has declined mitral clip. - Diuresed 8 lb w/ IV Lasix.  - AKI today, SCr 0.92>>1.49. SBPs soft, low 90s.  - Hold Lasix, Sherryll Burger and Spiro for now.  - Follow BMP - cMRI completed today, interpretation pending    2. MR, Severe -Evaluated back in 2021 for mitral clip however she did not want to pursue.  -Echo this admit redemonstrated severe MR  -She refuses any MV interventions    3. ETOH Abuse -At least 4 drinks a day -Watch for withdrawl   4. Tobacco Abuse.  -Reports she quit 8 days ago.   5. AKI  - SCr 0.92>>1.49 - ? Over diuresis. BPs also soft  - Hold diuretics + BP active meds as outlined above - follow BMP    6. VT - 20 beat run overnight - K 4.2, Mg 2.3 - cMRI pending  - monitor on tele  - may need live Zio at d/c   Length of Stay: 2  Robbie Lis, PA-C  07/13/2021, 8:39 AM  Advanced Heart Failure Team Pager [phone] (M-F; 7a - 5p)  Please contact [facility] for night-coverage after hours (5p -7a ) and weekends on [email or web address]    Patient seen and examined with the above-signed Advanced Practice Provider and/or Housestaff. I personally reviewed laboratory data, imaging studies and relevant notes. I independently examined the patient and formulated the important aspects of the plan. I have edited the note to reflect any of my changes or salient points. I have personally discussed the plan with the patient and/or family.  Feels better today. Less dyspneic. Denies orthopnea or PND.  Scr has bumped with IV diuresis  Had some NSVT overnight   General:  Well appearing. No resp difficulty HEENT: normal Neck: supple. no JVD. Carotids 2+  bilat; no bruits. No lymphadenopathy or thryomegaly appreciated. Cor: PMI nondisplaced. Regular rate & rhythm. 2/6 MR Lungs: clear Abdomen: soft, nontender, nondistended. No hepatosplenomegaly. No bruits or masses. Good bowel sounds. Extremities: no cyanosis, clubbing, rash, edema Neuro: alert & orientedx3, cranial nerves grossly intact. moves  all 4 extremities w/o difficulty. Affect pleasant  She appears to be overdiuresed. Will hold diuretics, spiro and Entresto for now. Encourage po intake today.  Await results of cMRI. Refuses consideration of MV repair.   Arvilla Meres, MD  5:14 PM

## 2021-07-13 NOTE — Progress Notes (Signed)
Mobility Specialist Progress Note    07/13/21 1051  Mobility  Activity Ambulated independently in hallway  Level of Assistance Standby assist, set-up cues, supervision of patient - no hands on  Assistive Device None  Distance Ambulated (ft) 165 ft  Activity Response Tolerated well  $Mobility charge 1 Mobility   Pre-Mobility: 90 HR, 91/62 BP, 100% SpO2 Post-Mobility: 86 HR, 106/49 BP, 97% SpO2  Pt received in bed and agreeable. C/o not wanting to be bothered. Returned to bed with call bell in reach.    Shiloh Nation Mobility Specialist

## 2021-07-13 NOTE — Progress Notes (Signed)
Pt currently not in room. RT found BiPAP on stby in room.

## 2021-07-13 NOTE — Progress Notes (Signed)
Subjective:  Pt is feeling well today. She is happy with her food today and is enjoying breakfast. She is agreeable to stay another night. She reports some constipation with last BM on Tuesday. Pt without any complaints currently.   Objective:  Vital signs in last 24 hours: Vitals:   07/13/21 0445 07/13/21 0500 07/13/21 0515 07/13/21 0530  BP:   (!) 91/51   Pulse: 74 69 68 70  Resp: 12 (!) 21 17   Temp:   98.3 F (36.8 C)   TempSrc:      SpO2: 96% 91%  98%  Weight:      Height:       Weight change: -2.479 kg  Intake/Output Summary (Last 24 hours) at 07/13/2021 0800 Last data filed at 07/13/2021 0041 Gross per 24 hour  Intake 843 ml  Output 1450 ml  Net -607 ml   General:  Lying comfortably in bed. HEENT: Normal Neck: Supple. No JVP appreciated Cor: PMI nondisplaced. Regular rate & rhythm. No rubs, gallops or murmurs. Lungs: Lungs clear to ausculation  Abdomen: Soft, nontender, nondistended.  Extremities: No cyanosis, clubbing, rash, no edema Neuro: Alert & orientedx3, cranial nerves grossly intact. moves all 4 extremities w/o difficulty. Affect pleasant  Assessment/Plan:  Principal Problem:   Acute exacerbation of CHF (congestive heart failure) (HCC) Active Problems:   Tobacco abuse   Alcohol use   MR (mitral regurgitation)   Patient Summary:  71 y.o. female with a pertinent PMH of HFrEF from nonischemic cardiomyopathy, hyperlipidemia, hypertension, prediabetes, COPD, tobacco and alcohol use disorder, who presents to [facility] with progressive shortness of breath and chest pain in the setting of medication noncompliance admitted for CHF exacerbation.   1. A/C HFmEF NICM in the setting of severe MR.  Echo today LVEF now ~ 45% which is up from previous EF 25-30% back in 2021. Evaluated back in 2021 for mitral clip however she did not want to pursue. MR remains severe.Now presents NYHA IV in the setting of medication noncompliance; stopped all meds 6 months ago. She does  want to take medications but is willing to take Lasix and Entresto possibly others if explained -Appreciate Heart Failure Teams recommendations -Awaiting cMRI planned for today - BNP > 3500 in setting of left atrial enlargement/severe MR.  - Pt BP 91/51 with bump in creatinine to 1.49 and GFR to 37 will stop 2.5 mg Metolazone and take a holiday for IV Lasix 80mg  BID, Entresto, and Spiro.  -Will need lasix and Entresto dosing prior to DC. - Follow renal function.  -Strict ins and outs and daily weights   2. Chest Pain: Troponinemia: Patient presents with 7-day history of central chest pain that is worse with movement. Tropes are minimally elevated at 29 with repeat at 26. EKG showed sinus tachycardia with left atrial enlargement. No significant lower extremity edema or signs of lower extremity DVT. Some pleuritic chest pain with deep inspiration and tachycardic that has fully resolved.  -Appreciate cardiology's recommendations. -Stat echo showing ongoing severe MR and EF of ~45%   3. COPD Tobacco use disorder, early remission? Patient on SABA at home. No previous PFTs. No recent history of exacerbations or hospitalizations. Baseline functionality consists of SHOB with walking up and down stairs and walking from room to bathroom. Sustained from tobacco use for the past 7 days. -We will start DuoNebs every 4 hours as needed -Consider initiating Saba and LAMA +/= LABA during this hospitalization. -We will need PFTs in the outpatient setting - Supplemental  oxygen at to maintain SPO2 between 89 and 93%   4. Prediabetes Last A1c a year ago was 5.8 Continue CBG monitoring daily Current A1c 5.9   5. HLD -Restarted Lipitor 40mg  daily   6. HTN Current BP 91/51 concern for over diurese.  -Restarted spiro 12.5mg  and Entresto for heart failure and BP   7. ETOH Abuse Currently at 4 drinks a day of vodka with last drink Monday 06/26 -Watch for withdrawal  8. Constipation Last BM Tuesday.   -Added Miralax 17g   TOC SW working with patient for financial concerns.   LOS: 2 days   Saturday, Medical Student 07/13/2021, 8:00 AM

## 2021-07-13 NOTE — Plan of Care (Signed)
  Problem: Education: Goal: Knowledge of General Education information will improve Description Including pain rating scale, medication(s)/side effects and non-pharmacologic comfort measures Outcome: Progressing   Problem: Health Behavior/Discharge Planning: Goal: Ability to manage health-related needs will improve Outcome: Progressing   Problem: Activity: Goal: Risk for activity intolerance will decrease Outcome: Progressing   Problem: Safety: Goal: Ability to remain free from injury will improve Outcome: Progressing   Problem: Cardiac: Goal: Ability to achieve and maintain adequate cardiopulmonary perfusion will improve Outcome: Progressing   

## 2021-07-13 NOTE — Progress Notes (Signed)
Pt showing no signs of resp distress.  BIPAP remains on standby at bedside. 

## 2021-07-14 ENCOUNTER — Other Ambulatory Visit (HOSPITAL_COMMUNITY): Payer: Self-pay

## 2021-07-14 DIAGNOSIS — I5023 Acute on chronic systolic (congestive) heart failure: Secondary | ICD-10-CM | POA: Diagnosis not present

## 2021-07-14 DIAGNOSIS — Z789 Other specified health status: Secondary | ICD-10-CM | POA: Diagnosis not present

## 2021-07-14 DIAGNOSIS — J449 Chronic obstructive pulmonary disease, unspecified: Secondary | ICD-10-CM

## 2021-07-14 DIAGNOSIS — N179 Acute kidney failure, unspecified: Secondary | ICD-10-CM | POA: Diagnosis not present

## 2021-07-14 DIAGNOSIS — I340 Nonrheumatic mitral (valve) insufficiency: Secondary | ICD-10-CM | POA: Diagnosis not present

## 2021-07-14 LAB — BASIC METABOLIC PANEL
Anion gap: 9 (ref 5–15)
BUN: 31 mg/dL — ABNORMAL HIGH (ref 8–23)
CO2: 27 mmol/L (ref 22–32)
Calcium: 9 mg/dL (ref 8.9–10.3)
Chloride: 100 mmol/L (ref 98–111)
Creatinine, Ser: 1.4 mg/dL — ABNORMAL HIGH (ref 0.44–1.00)
GFR, Estimated: 40 mL/min — ABNORMAL LOW (ref >60)
Glucose, Bld: 137 mg/dL — ABNORMAL HIGH (ref 70–99)
Potassium: 4.2 mmol/L (ref 3.5–5.1)
Sodium: 136 mmol/L (ref 135–145)

## 2021-07-14 LAB — CBC
HCT: 39.3 % (ref 36.0–46.0)
Hemoglobin: 12.8 g/dL (ref 12.0–15.0)
MCH: 27.9 pg (ref 26.0–34.0)
MCHC: 32.6 g/dL (ref 30.0–36.0)
MCV: 85.8 fL (ref 80.0–100.0)
Platelets: 237 10*3/uL (ref 150–400)
RBC: 4.58 MIL/uL (ref 3.87–5.11)
RDW: 14.8 % (ref 11.5–15.5)
WBC: 7.8 10*3/uL (ref 4.0–10.5)
nRBC: 0 % (ref 0.0–0.2)

## 2021-07-14 LAB — GLUCOSE, CAPILLARY: Glucose-Capillary: 144 mg/dL — ABNORMAL HIGH (ref 70–99)

## 2021-07-14 MED ORDER — FUROSEMIDE 40 MG PO TABS
40.0000 mg | ORAL_TABLET | Freq: Two times a day (BID) | ORAL | 0 refills | Status: DC
  Filled 2021-07-14: qty 60, 30d supply, fill #0

## 2021-07-14 MED ORDER — SPIRONOLACTONE 12.5 MG HALF TABLET: 12.5000 mg | ORAL_TABLET | Freq: Every day | ORAL | Status: DC

## 2021-07-14 MED ORDER — ENOXAPARIN SODIUM 30 MG/0.3ML IJ SOSY: 30.0000 mg | PREFILLED_SYRINGE | Freq: Every day | INTRAMUSCULAR | Status: DC

## 2021-07-14 MED ORDER — ATORVASTATIN CALCIUM 40 MG PO TABS
40.0000 mg | ORAL_TABLET | Freq: Every day | ORAL | 0 refills | Status: DC
  Filled 2021-07-14: qty 30, 30d supply, fill #0

## 2021-07-14 MED ORDER — SPIRONOLACTONE 25 MG PO TABS
12.5000 mg | ORAL_TABLET | Freq: Every day | ORAL | 0 refills | Status: DC
  Filled 2021-07-14: qty 15, 30d supply, fill #0

## 2021-07-14 MED ORDER — FUROSEMIDE 40 MG PO TABS: 40.0000 mg | ORAL_TABLET | Freq: Two times a day (BID) | ORAL | Status: DC

## 2021-07-14 MED ORDER — SACUBITRIL-VALSARTAN 24-26 MG PO TABS: 2.0000 | ORAL_TABLET | Freq: Two times a day (BID) | ORAL | Status: DC

## 2021-07-14 MED ORDER — EMPAGLIFLOZIN 10 MG PO TABS: 10.0000 mg | ORAL_TABLET | Freq: Every day | ORAL | Status: DC

## 2021-07-14 NOTE — TOC Benefit Eligibility Note (Signed)
Patient Advocate Encounter  Insurance verification completed.    The patient is currently admitted and upon discharge could be taking Farxiga 10 mg.  The current 30 day co-pay is, $95.00.   The patient is currently admitted and upon discharge could be taking Jardiance 10 mg.  The current 30 day co-pay is, $45.00.   The patient is insured through [employer] Medicare Part D    Aedan Geimer, CPhT Pharmacy Patient Advocate Specialist [facility] Patient Advocate Team Direct Number: [phone]  Fax: [phone]        

## 2021-07-14 NOTE — Care Management Important Message (Signed)
Important Message  Patient Details  Name: Emma Hahn MRN: [id number] Date of Birth: 1950-08-07   Medicare Important Message Given:  Yes     Renie Ora 07/14/2021, 8:16 AM

## 2021-07-14 NOTE — TOC Initial Note (Signed)
Transition of Care [facility]) - Initial/Assessment Note    Patient Details  Name: Emma Hahn MRN: [id number] Date of Birth: 07-Jun-1950  Transition [facility]) CM/SW Contact:    Leone Haven, RN Phone Number: 07/14/2021, 2:15 PM  Clinical Narrative:                 Patient is for dc today , his wife is at the bedside and will transport him home today.  He has no other needs.   Expected Discharge Plan: Home/Self Care Barriers to Discharge: No Barriers Identified   Patient Goals and CMS Choice Patient states their goals for this hospitalization and ongoing recovery are:: return home with wife   Choice offered to / list presented to : NA  Expected Discharge Plan and Services Expected Discharge Plan: Home/Self Care In-house Referral: NA Discharge Planning Services: CM Consult Post Acute Care Choice: NA Living arrangements for the past 2 months: Single Family Home Expected Discharge Date: 07/14/21                 DME Agency: NA       HH Arranged: NA          Prior Living Arrangements/Services Living arrangements for the past 2 months: Single Family Home Lives with:: Spouse Patient language and need for interpreter reviewed:: Yes Do you feel safe going back to the place where you live?: Yes      Need for Family Participation in Patient Care: Yes (Comment) Care giver support system in place?: Yes (comment)   Criminal Activity/Legal Involvement Pertinent to Current Situation/Hospitalization: No - Comment as needed  Activities of Daily Living Home Assistive Devices/Equipment: None ADL Screening (condition at time of admission) Patient's cognitive ability adequate to safely complete daily activities?: Yes Is the patient deaf or have difficulty hearing?: No Does the patient have difficulty seeing, even when wearing glasses/contacts?: No Does the patient have difficulty concentrating, remembering, or making decisions?: No Patient able to express need for  assistance with ADLs?: Yes Does the patient have difficulty dressing or bathing?: No Independently performs ADLs?: Yes (appropriate for developmental age) Does the patient have difficulty walking or climbing stairs?: Yes Weakness of Legs: None Weakness of Arms/Hands: None  Permission Sought/Granted                  Emotional Assessment Appearance:: Appears stated age Attitude/Demeanor/Rapport: Engaged Affect (typically observed): Appropriate Orientation: : Oriented to Self, Oriented to Place, Oriented to  Time, Oriented to Situation Alcohol / Substance Use: Not Applicable Psych Involvement: No (comment)  Admission diagnosis:  Acute exacerbation of CHF (congestive heart failure) (HCC) [I50.9] Acute on chronic congestive heart failure, unspecified heart failure type [facility]) [I50.9] Patient Active Problem List   Diagnosis Date Noted   AKI (acute kidney injury) (HCC)    Acute exacerbation of CHF (congestive heart failure) (HCC) 07/11/2021   MR (mitral regurgitation) 07/11/2021   Financial difficulties 12/10/2019   Alcohol use 12/09/2019   Acute respiratory failure with hypoxia (HCC) 12/08/2019   Acute on chronic combined systolic and diastolic CHF (congestive heart failure) (HCC) 12/06/2019   Tobacco abuse 12/06/2019   Prolonged QT interval 12/06/2019   Chest pain 12/06/2019   Elevated troponin 12/06/2019   Prediabetes 12/06/2019   Do not resuscitate 12/06/2019   PCP:  Norm Salt, PA Pharmacy:   [facility] [address] [place] - [address] [place] [address] Phone: [phone] Fax: [phone]  [facility] [address] [place] [address] Phone: [phone] Fax: [phone]     Social Determinants of Health (SDOH) Interventions Food Insecurity Interventions: Intervention Not Indicated, Other (Comment) (Pt reports having Food Stamps) Financial Strain Interventions: Other  (Comment) (Pt reports living on a fixed income and having difficulty paying bills, she is getting help from the [place] program for her Gas bill.) Housing Interventions: Intervention Not Indicated Transportation Interventions: Intervention Not Indicated  Readmission Risk Interventions    07/14/2021    2:13 PM  Readmission Risk Prevention Plan  Post Dischage Appt Complete  Medication Screening Complete  Transportation Screening Complete

## 2021-07-14 NOTE — Progress Notes (Addendum)
Advanced Heart Failure Rounding Note  PCP-Cardiologist: [name], MD   Subjective:    Diuretics held yesterday for AKI and soft BP/ suspected overdiuresis.   SCr 0.92>>1.49>>1.40 today. K 4.2   SBPs low 100s.   Wt up 2 lb. Denies dyspnea. Wants to go home today.    cMRI  IMPRESSION: 1.  Mild LV dilatation with severe systolic dysfunction (EF 22%)   2.  Normal RV size with moderate systolic dysfunction (EF 35%)   3. Basal septal midwall late gadolinium enhancement, which is a scar pattern seen in nonischemic cardiomyopathies and associated with a worse prognosis   4. RV insertion site LGE, which is a nonspecific scar pattern often seen in setting of elevated pulmonary pressures   5. Moderate pericardial effusion measuring 68mm adjacent to LV lateral wall   6.  Moderate mitral regurgitation (regurgitant fraction 33%)  Objective:   Weight Range: 65.2 kg Body mass index is 24.68 kg/m.   Vital Signs:   Temp:  [97.8 F (36.6 C)-98.1 F (36.7 C)] 97.8 F (36.6 C) (06/30 0550) Pulse Rate:  [77-99] 78 (06/30 0550) Resp:  [12-23] 17 (06/30 0550) BP: (90-110)/(49-63) 110/57 (06/30 0550) SpO2:  [93 %-99 %] 96 % (06/30 0550) Weight:  [65.2 kg] 65.2 kg (06/30 0550) Last BM Date : 07/11/21  Weight change: Filed Weights   07/12/21 0415 07/13/21 0024 07/14/21 0550  Weight: 62.9 kg 64.2 kg 65.2 kg    Intake/Output:   Intake/Output Summary (Last 24 hours) at 07/14/2021 0816 Last data filed at 07/14/2021 0500 Gross per 24 hour  Intake 520 ml  Output 600 ml  Net -80 ml      Physical Exam   General:  Well appearing. No respiratory difficulty HEENT: normal Neck: supple. no JVD. Carotids 2+ bilat; no bruits. No lymphadenopathy or thyromegaly appreciated. Cor: PMI nondisplaced. Regular rate & rhythm. No rubs, gallops or murmurs. Lungs: clear Abdomen: soft, nontender, nondistended. No hepatosplenomegaly. No bruits or masses. Good bowel  sounds. Extremities: no cyanosis, clubbing, rash, edema Neuro: alert & oriented x 3, cranial nerves grossly intact. moves all 4 extremities w/o difficulty. Affect pleasant.   Telemetry   NSR 80s, personally reviewed   Labs    CBC Recent Labs    07/11/21 0910 07/14/21 0636  WBC 7.9 7.8  NEUTROABS 4.3  --   HGB 11.4* 12.8  HCT 36.3 39.3  MCV 87.5 85.8  PLT 242 237   Basic Metabolic Panel Recent Labs    75/10/25 1258 07/12/21 0305 07/13/21 0639 07/14/21 0636  NA  --    < > 134* 136  K  --    < > 4.2 4.2  CL  --    < > 99 100  CO2  --    < > 25 27  GLUCOSE  --    < > 163* 137*  BUN  --    < > 23 31*  CREATININE  --    < > 1.49* 1.40*  CALCIUM  --    < > 8.8* 9.0  MG 1.7  --  2.3  --   PHOS 3.7  --   --   --    < > = values in this interval not displayed.   Liver Function Tests Recent Labs    07/12/21 0305  AST 26  ALT 18  ALKPHOS 45  BILITOT 0.9  PROT 6.9  ALBUMIN 3.4*   No results for input(s): "LIPASE", "AMYLASE" in the last 72  hours. Cardiac Enzymes No results for input(s): "CKTOTAL", "CKMB", "CKMBINDEX", "TROPONINI" in the last 72 hours.  BNP: BNP (last 3 results) Recent Labs    07/11/21 0910  BNP 3,547.8*    ProBNP (last 3 results) No results for input(s): "PROBNP" in the last 8760 hours.   D-Dimer No results for input(s): "DDIMER" in the last 72 hours. Hemoglobin A1C Recent Labs    07/11/21 1258  HGBA1C 5.9*   Fasting Lipid Panel No results for input(s): "CHOL", "HDL", "LDLCALC", "TRIG", "CHOLHDL", "LDLDIRECT" in the last 72 hours. Thyroid Function Tests No results for input(s): "TSH", "T4TOTAL", "T3FREE", "THYROIDAB" in the last 72 hours.  Invalid input(s): "FREET3"  Other results:   Imaging    No results found.   Medications:     Scheduled Medications:  aspirin EC  81 mg Oral Daily   atorvastatin  40 mg Oral Daily   enoxaparin (LOVENOX) injection  40 mg Subcutaneous Daily   polyethylene glycol  17 g Oral Daily    sacubitril-valsartan  2 tablet Oral BID   sodium chloride flush  3 mL Intravenous Q12H   spironolactone  12.5 mg Oral Daily   traZODone  100 mg Oral QHS    Infusions:   PRN Medications: acetaminophen **OR** acetaminophen, hydrOXYzine, ipratropium-albuterol    Patient Profile   71 year old female with history of ETOH abuse, tobacco abuse, MR, COPD, NICM, and HFrEF.   Admitted with A/C HFmEF in the setting of medication noncompliance.     Assessment/Plan   1. A/C HFmEF -NICM in the setting of severe MR and ETOH abuse.  -Now presents NYHA IV in the setting of medication noncompliance. She had stopped all meds 6 months ago. She does want to take medications but is willing to take diuretics.  - BNP > 3500 in setting of left atrial enlargement/severe MR.  -Echo this admit LVEF ~ 45% which is up from previous EF 25-30% back in 2021.  MR remains severe. She has declined mitral clip. - cMRI c/w severe NICM, LVEF 22%, RVEF 35%  - Diuresed 8 lb w/ IV Lasix.  - AKI, SCr 0.92>>1.49>>1.40. SBPs soft, low 100s - Restart Spiro 12.5 mg daily - Entresto 24-26 mg bid  - Lasix 40 mg bid  - Will arrange close clinic f/u in the [facility] and BMP in 1 wk    2. MR, Severe -Evaluated back in 2021 for mitral clip however she did not want to pursue.  -Echo this admit redemonstrated severe MR  -She refuses any MV interventions    3. ETOH Abuse -At least 4 drinks a day -Watch for withdrawl   4. Tobacco Abuse.  -Reports she quit 8 days ago.   5. AKI  - SCr 0.92>>1.49>>1.40  - ? Over diuresis. BPs also soft  - Hold Lasix and Entresto  - needs outpatient BMP in 1 wk    6. NSVT - 20 beat run 6/29 - no further recurrence  - K 4.2, Mg 2.3  d/w Dr. Haroldine Laws. [place] for d/c from cardiac standpoint.   Cardiac Meds for discharge  Atorvastatin 40 mg nightly  Spironolactone 12.5 mg daily  Entresto 24-26 mg bid  Lasix 40 mg bid   We will arrange close f/u in the [facility] and BMP in 1 wk    Length of  Stay: [address], PA-C  07/14/2021, 8:16 AM  Advanced Heart Failure Team Pager [phone] (M-F; 7a - 5p)  Please contact [facility] for night-coverage after hours (5p -7a )  and weekends on [email or web address]  Patient seen and examined with the above-signed Advanced Practice Provider and/or Housestaff. I personally reviewed laboratory data, imaging studies and relevant notes. I independently examined the patient and formulated the important aspects of the plan. I have edited the note to reflect any of my changes or salient points. I have personally discussed the plan with the patient and/or family.  Feels better. Wants to go home. cMRI results reviewed with her. Scr some better.   General:  Lying in bed  No resp difficulty HEENT: normal Neck: supple. no JVD. Carotids 2+ bilat; no bruits. No lymphadenopathy or thryomegaly appreciated. Cor: PMI nondisplaced. Regular rate & rhythm. 2/6 MR Lungs: clear Abdomen: soft, nontender, nondistended. No hepatosplenomegaly. No bruits or masses. Good bowel sounds. Extremities: no cyanosis, clubbing, rash, edema Neuro: alert & orientedx3, cranial nerves grossly intact. moves all 4 extremities w/o difficulty. Affect pleasant  I think this is as good as we are going to get her. Refuses consideration of ETOH and tobacco cessation/reduction. Wants to limit meds. Will try to resume previous GDMT regimen as tolerated and that she will agree to. Follow up in HF Clinic.  Arvilla Meres, MD  9:17 AM

## 2021-07-14 NOTE — TOC Transition Note (Signed)
Transition of Care [facility]) - CM/SW Discharge Note   Patient Details  Name: Emma Hahn MRN: [id number] Date of Birth: 12/15/1950  [facility] CM/SW Contact:  Leone Haven, RN Phone Number: 07/14/2021, 12:59 PM   Clinical Narrative:    Patient is for dc today, has no needs.      Barriers to Discharge: Continued Medical Work up   Patient Goals and CMS Choice        Discharge Placement                       Discharge Plan and Services In-house Referral: Clinical Social Work Discharge Planning Services: CM Consult                                 Social Determinants of Health (SDOH) Interventions Food Insecurity Interventions: Intervention Not Indicated, Other (Comment) (Pt reports having Food Stamps) Financial Strain Interventions: Other (Comment) (Pt reports living on a fixed income and having difficulty paying bills, she is getting help from the [place] program for her Gas bill.) Housing Interventions: Intervention Not Indicated Transportation Interventions: Intervention Not Indicated   Readmission Risk Interventions     No data to display

## 2021-07-14 NOTE — Discharge Summary (Signed)
Name: Emma Hahn MRN: [id number] DOB: 1950/08/02 71 y.o. PCP: Norm Salt, PA  Date of Admission: 07/11/2021  8:40 AM Date of Discharge: 07/14/21 Attending Physician: No att. providers found  Discharge Diagnosis: Acute on chronic HFrEF exacerbation Moderate pericardial effusion Troponinemia AKI COPD Tobacco use disorder Alcohol use Hyperlipidemia   Discharge Medications: Allergies as of 07/14/2021   No Known Allergies      Medication List     STOP taking these medications    metoprolol succinate 25 MG 24 hr tablet Commonly known as: TOPROL-XL   traZODone 100 MG tablet Commonly known as: DESYREL       TAKE these medications    albuterol 108 (90 Base) MCG/ACT inhaler Commonly known as: VENTOLIN HFA Inhale 2 puffs into the lungs as needed for wheezing or shortness of breath.   aspirin EC 81 MG tablet Take 1 tablet (81 mg total) by mouth daily. Swallow whole.   atorvastatin 40 MG tablet Commonly known as: LIPITOR Take 1 tablet (40 mg total) by mouth daily.   furosemide 40 MG tablet Commonly known as: LASIX Take 1 tablet (40 mg total) by mouth 2 (two) times daily.   nitroGLYCERIN 0.4 MG SL tablet Commonly known as: NITROSTAT Place 0.4 mg under the tongue every 5 (five) minutes as needed for chest pain.   sacubitril-valsartan 24-26 MG Commonly known as: ENTRESTO Take 2 tablets by mouth 2 (two) times daily. What changed: Another medication with the same name was removed. Continue taking this medication, and follow the directions you see here.   spironolactone 25 MG tablet Commonly known as: ALDACTONE Take 1/2 tablet (12.5 mg total) by mouth daily. Start taking on: July 15, 2021        Disposition and follow-up:   Emma Hahn was discharged from [facility] in Stable condition.  At the hospital follow up visit please address:  1.    Acute on chronic HFrEF AKI -Her Lasix, Entresto and spironolactone were resumed on  discharge. -Beta-blocker was held due to her biventricular heart failure.  Cardiology might consider resuming it at follow-up. -Repeat BMP to evaluate for her AKI -Encourage medication adherence  Moderate pericardial effusions No clinical evidence of cardiac tamponade.  Please reassess at cardiology follow-up.  COPD -Encourage smoking cessation  Alcohol use Reportedly drinks 4 airplane bottles of liquor a day CIWA score has been 0 throughout the admission -Encourage alcohol  cessation  2.  Labs / imaging needed at time of follow-up: CBC, BMP  3.  Pending labs/ test needing follow-up: NA  Follow-up Appointments:  Follow-up Information     [facility] Follow up on 07/28/2021.   Specialty: Cardiology Why: Advanced [facility] Failure [facility] 11:30 am Entrance C, Free Valet Parking Contact information: [address] [id number] mc [place] [address] [phone]        [facility] Follow up.   Specialty: Cardiology Why: 07/21/21 at 12:00 PM for Lab work to check kidneys and potassium levels Contact information: [address] [id number] mc [address] [place] [address] [phone]        Norm Salt, [place]. Go on 07/17/2021.   Specialty: Physician Assistant Why: @8 : information: [address] [place] [address]         [phone], MD .   Specialty: Cardiology Contact information: [address] [place] [address] [place] [phone]  Hospital Course:  71 y.o. 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  She has been noticing increasing shortness of breath, decreased exercise tolerance, orthopnea and  abdominal bloating.  Also reports central chest pain over the last 5 days that is worse with exertion. Patient has stopped taking all of her medications about 6 months ago due to personal preference.   In ER, troponin was 29 and 26 likely due to demand ischemia. BNP greater than 3,500.  Chest x-ray showed pulmonary edema.  Repeat cardiogram showed EF 30-35%, reduced RV function, severe mitral valve regurgitation and small pericardial effusion.  Cardiac MRI showed mild LV dilation with EF 22%.  There is basal septal scar pattern seen in nonischemic cardiomyopathy.  There is also a moderate pericardial effusion.  Patient was diuresed with IV Lasix 80 mg twice daily and 1 dose of metolazone.  Her Entresto, Lasix and spironolactone were held on day 2 given acute AKI.  Her creatinine minimally improved the next day from 1.49 to 1.4.  Cardiology thought that she was stable for discharge.  No further work-up for her moderate pericardial effusion without evidence of cardiac tamponade. She will be continuing Entresto, Lasix, spironolactone and follow-up with heart failure clinic in 1 week.  Beta-blocker was held due to her biventricular heart failure.  Cardiology might resume it at follow-up.  HPI Patient appears well on exam.  She denies any issue.  [place] Spaniel that she is ready to go home.  She still has an unopened bottle of Entresto.  Discharge Exam:   BP (!) 100/53 (BP Location: Right Arm)   Pulse 74   Temp 97.7 F (36.5 C) (Axillary)   Resp 11   Ht 5\' 4"  (1.626 m)   Wt 65.2 kg Comment: scale a  SpO2 99%   BMI 24.68 kg/m  Discharge exam:   Physical Exam Constitutional:      General: She is not in acute distress.    Appearance: Normal appearance.  HENT:     Head: Normocephalic.  Eyes:     General:        Right eye: No discharge.        Left eye: No discharge.     Conjunctiva/sclera: Conjunctivae normal.  Cardiovascular:     Rate and Rhythm: Normal rate and regular rhythm.     Heart sounds:  Normal heart sounds.     Comments: No LE edema, no JVD Pulmonary:     Effort: Pulmonary effort is normal. No respiratory distress.     Breath sounds: Normal breath sounds. No wheezing.  Musculoskeletal:        General: Normal range of motion.  Skin:    General: Skin is warm.  Neurological:     Mental Status: She is alert and oriented to person, place, and time.  Psychiatric:        Mood and Affect: Mood normal.        Behavior: Behavior normal.      Pertinent Labs, Studies, and Procedures:     Latest Ref Rng & Units 07/14/2021    6:36 AM 07/11/2021    9:10 AM 12/10/2019    2:21 AM  CBC  WBC 4.0 - 10.5 K/uL 7.8  7.9  9.2   Hemoglobin 12.0 - 15.0 g/dL 12/12/2019  65.7  84.6   Hematocrit 36.0 - 46.0 % 39.3  36.3  38.7  Platelets 150 - 400 K/uL 237  242  181       Latest Ref Rng & Units 07/14/2021    6:36 AM 07/13/2021    6:39 AM 07/12/2021   11:53 AM  CMP  Glucose 70 - 99 mg/dL 137  163  104   BUN 8 - 23 mg/dL 31  23  11    Creatinine 0.44 - 1.00 mg/dL 1.40  1.49  0.92   Sodium 135 - 145 mmol/L 136  134  137   Potassium 3.5 - 5.1 mmol/L 4.2  4.2  3.8   Chloride 98 - 111 mmol/L 100  99  102   CO2 22 - 32 mmol/L 27  25  27    Calcium 8.9 - 10.3 mg/dL 9.0  8.8  8.9    ECHOCARDIOGRAM COMPLETE  Result Date: 07/11/2021    ECHOCARDIOGRAM REPORT   Patient Name:   Emma Hahn Date of Exam: 07/11/2021 Medical Rec #:  [id number]    Height:       64.0 in Accession #:    [id number]   Weight:       147.0 lb Date of Birth:  10-03-1950    BSA:          1.716 m Patient Age:    83 years     BP:           139/86 mmHg Patient Gender: F            HR:           96 bpm. Exam Location:  Inpatient Procedure: 2D Echo, Cardiac Doppler and Color Doppler STAT ECHO Indications:    CHF  History:        Patient has prior history of Echocardiogram examinations. CHF;                 Risk Factors:Current Smoker.  Sonographer:    Jyl Heinz Referring Phys: [place]  1. Left ventricular ejection  fraction, by estimation, is 30 to 35%. The left ventricle has moderately decreased function. The left ventricle demonstrates regional wall motion abnormalities (see scoring diagram/findings for description). Left ventricular  diastolic parameters are consistent with Grade II diastolic dysfunction (pseudonormalization). Elevated left atrial pressure. There is global hypokinesis, with possible disproportionately severe hypokinesis of the basal inferolateral wall.  2. Right ventricular systolic function is mildly reduced. The right ventricular size is normal. There is moderately elevated pulmonary artery systolic pressure. The estimated right ventricular systolic pressure is [id number] mmHg.  3. Left atrial size was moderately dilated.  4. A small pericardial effusion is present. The pericardial effusion is circumferential. There is no evidence of cardiac tamponade.  5. The mitral valve is abnormal. Moderate to severe mitral valve regurgitation.  6. Tricuspid valve regurgitation is mild to moderate.  7. The aortic valve is tricuspid. Aortic valve regurgitation is not visualized. No aortic stenosis is present.  8. The inferior vena cava is normal in size with greater than 50% respiratory variability, suggesting right atrial pressure of 3 mmHg. Comparison(s): No significant change from prior study. Prior images reviewed side by side. FINDINGS  Left Ventricle: Left ventricular ejection fraction, by estimation, is 30 to 35%. The left ventricle has moderately decreased function. The left ventricle demonstrates regional wall motion abnormalities. The left ventricular internal cavity size was normal in size. There is no left ventricular hypertrophy. Left ventricular diastolic parameters are consistent with Grade II diastolic dysfunction (pseudonormalization). Elevated left atrial pressure.  LV Wall Scoring: There  is global hypokinesis, with possible disproportionately severe hypokinesis of the basal inferolateral wall. Right  Ventricle: The right ventricular size is normal. No increase in right ventricular wall thickness. Right ventricular systolic function is mildly reduced. There is moderately elevated pulmonary artery systolic pressure. The tricuspid regurgitant velocity is 3.72 m/s, and with an assumed right atrial pressure of 3 mmHg, the estimated right ventricular systolic pressure is [id number] mmHg. Left Atrium: Left atrial size was moderately dilated. Right Atrium: Right atrial size was normal in size. Pericardium: A small pericardial effusion is present. The pericardial effusion is circumferential. There is no evidence of cardiac tamponade. Mitral Valve: There is no mitral valve prolapse. The MR appears to be due to malcoaptation secondary to relative tethering of the posterior leaflet. The mitral valve is abnormal. There is mild thickening of the mitral valve leaflet(s). Moderate to severe  mitral valve regurgitation, with eccentric posteriorly directed jet. Tricuspid Valve: The tricuspid valve is normal in structure. Tricuspid valve regurgitation is mild to moderate. Aortic Valve: The aortic valve is tricuspid. Aortic valve regurgitation is not visualized. No aortic stenosis is present. Aortic valve peak gradient measures 6.4 mmHg. Pulmonic Valve: The pulmonic valve was grossly normal. Pulmonic valve regurgitation is trivial. Aorta: The aortic root and ascending aorta are structurally normal, with no evidence of dilitation. Venous: The inferior vena cava is normal in size with greater than 50% respiratory variability, suggesting right atrial pressure of 3 mmHg. IAS/Shunts: No atrial level shunt detected by color flow Doppler. Additional Comments: There is pleural effusion in the left lateral region.  LEFT VENTRICLE PLAX 2D LVIDd:         5.20 cm      Diastology LVIDs:         4.30 cm      LV e' medial:    4.35 cm/s LV PW:         1.20 cm      LV E/e' medial:  29.4 LV IVS:        1.00 cm      LV e' lateral:   6.09 cm/s LVOT diam:      2.00 cm      LV E/e' lateral: 21.0 LV SV:         34 LV SV Index:   20 LVOT Area:     3.14 cm  LV Volumes (MOD) LV vol d, MOD A2C: 149.0 ml LV vol d, MOD A4C: 119.0 ml LV vol s, MOD A2C: 81.5 ml LV vol s, MOD A4C: 61.0 ml LV SV MOD A2C:     67.5 ml LV SV MOD A4C:     119.0 ml LV SV MOD BP:      61.9 ml RIGHT VENTRICLE            IVC RV Basal diam:  4.00 cm    IVC diam: 1.60 cm RV Mid diam:    3.00 cm RV S prime:     9.36 cm/s TAPSE (M-mode): 1.5 cm LEFT ATRIUM             Index        RIGHT ATRIUM           Index LA diam:        4.60 cm 2.68 cm/m   RA Area:     17.00 cm LA Vol (A2C):   76.5 ml 44.57 ml/m  RA Volume:   44.90 ml  26.16 ml/m LA Vol (A4C):   65.4 ml 38.10 ml/m LA  Biplane Vol: 70.9 ml 41.31 ml/m  AORTIC VALVE AV Area (Vmax): 2.23 cm AV Vmax:        126.00 cm/s AV Peak Grad:   6.4 mmHg LVOT Vmax:      89.50 cm/s LVOT Vmean:     66.100 cm/s LVOT VTI:       0.108 m  AORTA Ao Root diam: 2.70 cm Ao Asc diam:  2.80 cm MITRAL VALVE                TRICUSPID VALVE MV Area (PHT): 5.34 cm     TR Peak grad:   55.4 mmHg MV Decel Time: 142 msec     TR Vmax:        372.00 cm/s MR Peak grad: 118.4 mmHg MR Mean grad: 92.0 mmHg     SHUNTS MR Vmax:      544.00 cm/s   Systemic VTI:  0.11 m MR Vmean:     461.0 cm/s    Systemic Diam: 2.00 cm MV E velocity: 128.00 cm/s MV A velocity: 85.10 cm/s MV E/A ratio:  1.50 Mihai Croitoru MD Electronically signed by Sanda Klein MD Signature Date/Time: 07/11/2021/2:42:59 PM    Final    DG Chest Portable 1 View  Result Date: 07/11/2021 CLINICAL DATA:  Shortness of breath. Worsening cough. Labored breathing. EXAM: PORTABLE CHEST 1 VIEW COMPARISON:  12/06/2019 FINDINGS: Cardiomegaly. Aortic atherosclerosis. Pulmonary venous hypertension with interstitial pulmonary edema. Congestive heart failure is favored. There could possibly be coexistence bronchitis or hazy pneumonia. No dense consolidation or lobar collapse. No visible effusion. IMPRESSION: Probable congestive heart  failure/pulmonary edema. Electronically Signed   By: Nelson Chimes M.D.   On: 07/11/2021 09:02     Discharge Instructions: Discharge Instructions     Call MD for:  difficulty breathing, headache or visual disturbances   Complete by: As directed    Call MD for:  persistant nausea and vomiting   Complete by: As directed    Call MD for:  redness, tenderness, or signs of infection (pain, swelling, redness, odor or green/yellow discharge around incision site)   Complete by: As directed    Diet - low sodium heart healthy   Complete by: As directed    Discharge instructions   Complete by: As directed    Ms. Hollister,  It was a pleasure taking care of you during this admission. You were admitted for fluid in your lungs from heart failure. It is important that you start taking your medications after discharge to prevent readmission.   Your medications after discharge including: Lasix 40 mg twice daily Atorvastatin 40 mg once daily Spironolactone 25 mg once daily  Entresto 24-26 mg 2 tablets twice daily  Please follow-up with heart failure clinic next week.  Please also follow-up with your regular doctor within 1-2 weeks.  Take care   Increase activity slowly   Complete by: As directed        Signed: Gaylan Gerold, DO 07/14/2021, 3:47 PM   Pager: [phone]

## 2021-07-14 NOTE — Progress Notes (Addendum)
Reviewed discharge medications and instructions with patient. Pt now discharged home.Pt escorted to ED entrance in wheelchair to discharge home.

## 2021-07-17 DIAGNOSIS — F5101 Primary insomnia: Secondary | ICD-10-CM | POA: Diagnosis not present

## 2021-07-17 DIAGNOSIS — E6609 Other obesity due to excess calories: Secondary | ICD-10-CM | POA: Diagnosis not present

## 2021-07-17 DIAGNOSIS — I25118 Atherosclerotic heart disease of native coronary artery with other forms of angina pectoris: Secondary | ICD-10-CM | POA: Diagnosis not present

## 2021-07-17 DIAGNOSIS — Z0001 Encounter for general adult medical examination with abnormal findings: Secondary | ICD-10-CM | POA: Diagnosis not present

## 2021-07-17 DIAGNOSIS — Z720 Tobacco use: Secondary | ICD-10-CM | POA: Diagnosis not present

## 2021-07-17 DIAGNOSIS — J418 Mixed simple and mucopurulent chronic bronchitis: Secondary | ICD-10-CM | POA: Diagnosis not present

## 2021-07-17 DIAGNOSIS — R7303 Prediabetes: Secondary | ICD-10-CM | POA: Diagnosis not present

## 2021-07-17 DIAGNOSIS — E7800 Pure hypercholesterolemia, unspecified: Secondary | ICD-10-CM | POA: Diagnosis not present

## 2021-07-17 DIAGNOSIS — Z6830 Body mass index (BMI) 30.0-30.9, adult: Secondary | ICD-10-CM | POA: Diagnosis not present

## 2021-07-17 DIAGNOSIS — I5042 Chronic combined systolic (congestive) and diastolic (congestive) heart failure: Secondary | ICD-10-CM | POA: Diagnosis not present

## 2021-07-17 DIAGNOSIS — I10 Essential (primary) hypertension: Secondary | ICD-10-CM | POA: Diagnosis not present

## 2021-07-17 DIAGNOSIS — I5022 Chronic systolic (congestive) heart failure: Secondary | ICD-10-CM | POA: Diagnosis not present

## 2021-07-21 ENCOUNTER — Other Ambulatory Visit (HOSPITAL_COMMUNITY): Payer: Medicare HMO

## 2021-07-25 ENCOUNTER — Other Ambulatory Visit (HOSPITAL_COMMUNITY): Payer: Self-pay | Admitting: Family Medicine

## 2021-07-25 ENCOUNTER — Ambulatory Visit (HOSPITAL_COMMUNITY)
Admission: RE | Admit: 2021-07-25 | Discharge: 2021-07-25 | Disposition: A | Discharge status: Home / Self Care | Payer: Medicare HMO | Source: Ambulatory Visit | Attending: Cardiology | Admitting: Cardiology

## 2021-07-25 DIAGNOSIS — I5042 Chronic combined systolic (congestive) and diastolic (congestive) heart failure: Secondary | ICD-10-CM | POA: Insufficient documentation

## 2021-07-25 LAB — BASIC METABOLIC PANEL
Anion gap: 12 (ref 5–15)
BUN: 20 mg/dL (ref 8–23)
CO2: 25 mmol/L (ref 22–32)
Calcium: 9.2 mg/dL (ref 8.9–10.3)
Chloride: 99 mmol/L (ref 98–111)
Creatinine, Ser: 1.18 mg/dL — ABNORMAL HIGH (ref 0.44–1.00)
GFR, Estimated: 49 mL/min — ABNORMAL LOW (ref >60)
Glucose, Bld: 134 mg/dL — ABNORMAL HIGH (ref 70–99)
Potassium: 3.7 mmol/L (ref 3.5–5.1)
Sodium: 136 mmol/L (ref 135–145)

## 2021-07-27 ENCOUNTER — Telehealth (HOSPITAL_COMMUNITY): Payer: Self-pay

## 2021-07-27 NOTE — Telephone Encounter (Signed)
Called and was unable to leave a voice message to confirm/remind patient of their appointment at the [facility] on 07/28/21.

## 2021-07-28 ENCOUNTER — Encounter (HOSPITAL_COMMUNITY): Payer: Medicare HMO

## 2021-08-15 ENCOUNTER — Other Ambulatory Visit (HOSPITAL_COMMUNITY): Payer: Self-pay | Admitting: *Deleted

## 2021-08-15 ENCOUNTER — Ambulatory Visit (HOSPITAL_COMMUNITY)
Admission: RE | Admit: 2021-08-15 | Discharge: 2021-08-15 | Disposition: A | Discharge status: Home / Self Care | Payer: Medicare HMO | Source: Ambulatory Visit | Attending: Family Medicine | Admitting: Family Medicine

## 2021-08-15 ENCOUNTER — Encounter (HOSPITAL_COMMUNITY): Payer: Self-pay

## 2021-08-15 ENCOUNTER — Telehealth (HOSPITAL_COMMUNITY): Payer: Self-pay | Admitting: Pharmacy Technician

## 2021-08-15 VITALS — BP 134/64 | HR 100 | Wt 142.8 lb

## 2021-08-15 DIAGNOSIS — F1010 Alcohol abuse, uncomplicated: Secondary | ICD-10-CM | POA: Insufficient documentation

## 2021-08-15 DIAGNOSIS — I472 Ventricular tachycardia, unspecified: Secondary | ICD-10-CM | POA: Diagnosis not present

## 2021-08-15 DIAGNOSIS — I428 Other cardiomyopathies: Secondary | ICD-10-CM | POA: Insufficient documentation

## 2021-08-15 DIAGNOSIS — Z139 Encounter for screening, unspecified: Secondary | ICD-10-CM

## 2021-08-15 DIAGNOSIS — I5042 Chronic combined systolic (congestive) and diastolic (congestive) heart failure: Secondary | ICD-10-CM | POA: Diagnosis not present

## 2021-08-15 DIAGNOSIS — Z79899 Other long term (current) drug therapy: Secondary | ICD-10-CM | POA: Insufficient documentation

## 2021-08-15 DIAGNOSIS — R0602 Shortness of breath: Secondary | ICD-10-CM | POA: Diagnosis not present

## 2021-08-15 DIAGNOSIS — I340 Nonrheumatic mitral (valve) insufficiency: Secondary | ICD-10-CM | POA: Diagnosis not present

## 2021-08-15 DIAGNOSIS — Z720 Tobacco use: Secondary | ICD-10-CM

## 2021-08-15 DIAGNOSIS — F17210 Nicotine dependence, cigarettes, uncomplicated: Secondary | ICD-10-CM | POA: Diagnosis not present

## 2021-08-15 DIAGNOSIS — J449 Chronic obstructive pulmonary disease, unspecified: Secondary | ICD-10-CM | POA: Diagnosis not present

## 2021-08-15 DIAGNOSIS — I4729 Other ventricular tachycardia: Secondary | ICD-10-CM | POA: Diagnosis not present

## 2021-08-15 LAB — BASIC METABOLIC PANEL
Anion gap: 7 (ref 5–15)
BUN: 23 mg/dL (ref 8–23)
CO2: 27 mmol/L (ref 22–32)
Calcium: 9.3 mg/dL (ref 8.9–10.3)
Chloride: 102 mmol/L (ref 98–111)
Creatinine, Ser: 0.97 mg/dL (ref 0.44–1.00)
GFR, Estimated: >60 mL/min (ref >60)
Glucose, Bld: 105 mg/dL — ABNORMAL HIGH (ref 70–99)
Potassium: 3.5 mmol/L (ref 3.5–5.1)
Sodium: 136 mmol/L (ref 135–145)

## 2021-08-15 LAB — BRAIN NATRIURETIC PEPTIDE: B Natriuretic Peptide: 232.7 pg/mL — ABNORMAL HIGH (ref 0.0–100.0)

## 2021-08-15 MED ORDER — FUROSEMIDE 40 MG PO TABS: 40.0000 mg | ORAL_TABLET | Freq: Two times a day (BID) | ORAL | 6 refills | Status: DC

## 2021-08-15 MED ORDER — SACUBITRIL-VALSARTAN 24-26 MG PO TABS: 2.0000 | ORAL_TABLET | Freq: Two times a day (BID) | ORAL | Status: DC

## 2021-08-15 MED ORDER — NITROGLYCERIN 0.4 MG SL SUBL: 0.4000 mg | SUBLINGUAL_TABLET | SUBLINGUAL | 11 refills | Status: DC | PRN

## 2021-08-15 MED ORDER — ATORVASTATIN CALCIUM 40 MG PO TABS: 40.0000 mg | ORAL_TABLET | Freq: Every day | ORAL | 6 refills | Status: DC

## 2021-08-15 MED ORDER — SACUBITRIL-VALSARTAN 24-26 MG PO TABS: 2.0000 | ORAL_TABLET | Freq: Two times a day (BID) | ORAL | 6 refills | Status: DC

## 2021-08-15 MED ORDER — SPIRONOLACTONE 25 MG PO TABS: 12.5000 mg | ORAL_TABLET | Freq: Every day | ORAL | 3 refills | Status: DC

## 2021-08-15 MED ORDER — ASPIRIN 81 MG PO TBEC: 81.0000 mg | DELAYED_RELEASE_TABLET | Freq: Every day | ORAL | 11 refills | Status: DC

## 2021-08-15 NOTE — Patient Instructions (Signed)
RESTART Entresto 24/26 mg one tab twice a day  Labs today We will only contact you if something comes back abnormal or we need to make some changes. Otherwise no news is good news!  Labs needed in 10 days  Your physician recommends that you schedule a follow-up appointment in: 4 weeks  in the Advanced Practitioners (PA/NP) Clinic   Your physician wants you to follow-up in: 4 months with Dr [name] and echo. You will receive a reminder letter in the mail two months in advance. If you don't receive a letter, please call our office to schedule the follow-up appointment.  Your physician has requested that you have an echocardiogram. Echocardiography is a painless test that uses sound waves to create images of your heart. It provides your doctor with information about the size and shape of your heart and how well your heart's chambers and valves are working. This procedure takes approximately one hour. There are no restrictions for this procedure.   Do the following things EVERYDAY: Weigh yourself in the morning before breakfast. Write it down and keep it in a log. Take your medicines as prescribed Eat low salt foods--Limit salt (sodium) to 2000 mg per day.  Stay as active as you can everyday Limit all fluids for the day to less than 2 liters  At the [facility], you and your health needs are our priority. As part of our continuing mission to provide you with exceptional heart care, we have created designated Provider Care Teams. These Care Teams include your primary Cardiologist (physician) and Advanced Practice Providers (APPs- Physician Assistants and Nurse Practitioners) who all work together to provide you with the care you need, when you need it.   You may see any of the following providers on your designated Care Team at your next follow up: Dr Arvilla Meres Dr Carron Curie, NP Robbie Lis, [place] [facility] [place], [place] Karle Plumber,  PharmD   Please be sure to bring in all your medications bottles to every appointment.

## 2021-08-15 NOTE — Telephone Encounter (Signed)
Advanced Heart Failure Patient Advocate Encounter  The patient was approved for a [employer] grant that will help cover the cost of Entresto. Total amount awarded, $10,000. Eligibility, 07/16/21 - 07/16/22.  ID [id number]  BIN [id number]  PCN [id number]  Group [id number]  Emailed patient a copy of the grant information to give to the pharmacy. Sent 90 day RX request to [place] ([facility]) to send to [place].   Archer Asa, CPhT

## 2021-08-15 NOTE — Progress Notes (Signed)
Medication Samples have been provided to the patient.  Drug name: entresto       Strength: 24/26        Qty: 56  LOT: [id number]  Exp.Date: 08/24  Dosing instructions: one tab twice a day  The patient has been instructed regarding the correct time, dose, and frequency of taking this medication, including desired effects and most common side effects.   Magda Bernheim M 12:03 PM 08/15/2021

## 2021-08-15 NOTE — Progress Notes (Signed)
ADVANCED HF CLINIC NOTE   Primary Care: Norm Salt, PA Primary Cardiologist: [name], MD HF Cardiologist: Dr. [name]  HPI: Emma Hahn is a 71 y.o. female  She has been followed by Dr. [name] and was recently referred to Dr. [name] for consideration of a MitraClip for her severe MR.    The patient has a history of alcohol and tobacco use with COPD.  She developed dyspnea and chest pressure in 5/21  She had echo performed with recommendations to have outpatient cardiology evaluation.  The patient declined any further assessment at that time.  She went on to develop worsening shortness of breath and chest pressure, prompting hospitalization in 11/21.  At that time she was found to have severe LV dysfunction with LVEF 25 to 30% as well as severe mitral and tricuspid regurgitation.  She ultimately underwent right and left heart catheterization on 12/08/19 demonstrating no obstructive CAD.  She had mild pulmonary hypertension with a mean PA pressure of 32 mmHg with ,mean PCWP 27 mmHg with a v-wave of 33. Fick was low 2.5/1.5   Medical therapy has been titrated but limited by low BP.   TEE 12/09/19 EF 25-30% predominantly central MR with mild posterior component due to tethering of posterior MV leaflet  Saw Dr [name] 07/2020. Entresto was increased to 49-51 bid.   She canceled f/u appointment 09/2020 and no showed 11/2021.    Admitted 6/23 with a/c CHF after stopping all meds for past 6 months. Started on IV lasix. Echo showed EF 45-50%, severe MR, and LVH. GDMT limited by low BP and AKI. Discharged home, weight 138 lbs.  Today she returns for post hospital HF follow up. Overall feeling fine. She has SOB walking up steps or walking on flat ground for further distances. Says she is SOB if she does not take her meds. Has been out of Entresto x 1 week. Denies palpitations, CP, dizziness, edema, or PND/Orthopnea. Appetite ok. No fever or chills. She does not weigh  at home. Drinks beer "as much as I want." Smokes 4 cigs/day.    Cardiac Studies - cMRI (6/23): c/w severe NICM, LVEF 22%, RVEF 35%   - Echo (6/23): EF 45-50%, Left atrial enlargement, severe MR, and LVH.   - TEE (11//21): EF 25-30% predominantly central MR with mild posterior component due to tethering of posterior MV leaflet  - R/LHC (11/21): no obstructive CAD. Mild pulmonary hypertension with a mean PA pressure of 32 mmHg, mean PCWP 27 mmHg, with a v-wave of 33. CO/CI (Fick) was low 2.5/1.5  ROS: All systems reviewed and negative except as per HPI.    Past Medical History:  Diagnosis Date   Borderline diabetes    COPD (chronic obstructive pulmonary disease) (HCC)    Habitual alcohol use    Tobacco abuse    Current Outpatient Medications  Medication Sig Dispense Refill   albuterol (VENTOLIN HFA) 108 (90 Base) MCG/ACT inhaler Inhale 2 puffs into the lungs as needed for wheezing or shortness of breath.     aspirin EC 81 MG EC tablet Take 1 tablet (81 mg total) by mouth daily. Swallow whole. 30 tablet 11   atorvastatin (LIPITOR) 40 MG tablet Take 1 tablet (40 mg total) by mouth daily. 30 tablet 0   furosemide (LASIX) 40 MG tablet Take 1 tablet (40 mg total) by mouth 2 (two) times daily. 60 tablet 0   nitroGLYCERIN (NITROSTAT) 0.4 MG SL tablet Place 0.4 mg under the tongue every 5 (  five) minutes as needed for chest pain.     spironolactone (ALDACTONE) 25 MG tablet Take 1/2 tablet (12.5 mg total) by mouth daily. 15 tablet 0   sacubitril-valsartan (ENTRESTO) 24-26 MG Take 2 tablets by mouth 2 (two) times daily. (Patient not taking: Reported on 08/15/2021)     No current facility-administered medications for this encounter.    No Known Allergies    Social History   Socioeconomic History   Marital status: Single    Spouse name: Not on file   Number of children: Not on file   Years of education: Not on file   Highest education level: Not on file  Occupational History   Not on  file  Tobacco Use   Smoking status: Every Day    Types: Cigarettes   Smokeless tobacco: Never  Substance and Sexual Activity   Alcohol use: Yes    Comment: Usually drinks vodka or gin or beer, 3 days out of the week   Drug use: Never   Sexual activity: Not on file  Other Topics Concern   Not on file  Social History Narrative   Not on file   Social Determinants of Health   Financial Resource Strain: Medium Risk (07/11/2021)   Overall Financial Resource Strain (CARDIA)    Difficulty of Paying Living Expenses: Somewhat hard  Food Insecurity: No Food Insecurity (07/11/2021)   Hunger Vital Sign    Worried About Running Out of Food in the Last Year: Never true    Ran Out of Food in the Last Year: Never true  Transportation Needs: No Transportation Needs (07/11/2021)   PRAPARE - Administrator, Civil Service (Medical): No    Lack of Transportation (Non-Medical): No  Physical Activity: Not on file  Stress: Not on file  Social Connections: Not on file  Intimate Partner Violence: Not on file   Family History  Problem Relation Age of Onset   Stroke Maternal Grandmother        ? Possible heart disease too - patient unsure   BP 134/64   Pulse 100   Wt 64.8 kg (142 lb 12.8 oz)   SpO2 97%   BMI 24.51 kg/m   Wt Readings from Last 3 Encounters:  08/15/21 64.8 kg (142 lb 12.8 oz)  07/14/21 65.2 kg (143 lb 12.8 oz)  07/29/20 66.8 kg (147 lb 3.2 oz)   PHYSICAL EXAM: General:  NAD. No resp difficulty HEENT: Normal Neck: Supple. No JVD. Carotids 2+ bilat; no bruits. No lymphadenopathy or thryomegaly appreciated. Cor: PMI nondisplaced. Regular rate & rhythm. No rubs, gallops, 2/6 MR Lungs: Clear Abdomen: Soft, nontender, nondistended. No hepatosplenomegaly. No bruits or masses. Good bowel sounds. Extremities: No cyanosis, clubbing, rash, edema Neuro: Alert & oriented x 3, cranial nerves grossly intact. Moves all 4 extremities w/o difficulty. Affect pleasant.  ECG: NSR  with PVCs, 88 bpm QTC 483 msec (personally reviewed)  ASSESSMENT & PLAN: 1. Chronic HFmEF - NICM in the setting of severe MR and ETOH abuse.  - Recent admit 7/23 after being off all meds x 6 months. - Echo (7/23): EF ~ 45% which is up from previous EF 25-30% back in 2021.  MR remains severe. She has declined mitral clip. - cMRI (7/23): c/w severe NICM, LVEF 22%, RVEF 35%  - NYHA II-early III, volume OK. - Restart Entresto 24/26 mg bid. Patient assistance started. - Continue spiro 12.5 mg daily. - Continue Lasix 40 mg bid.  - Labs today, repeat BMET in 10  days. - Plan to add Marcelline Deist next and back down on Lasix. I am not sure she will be agreeable to this. - I asked her to weigh daily, she has a scale.   2. MR, Severe - Evaluated back in 2021 for mitral clip however she did not want to pursue.  - Echo this admit (7/23) demonstrated severe MR.  - She refuses any MV interventions.    3. ETOH Abuse - Advised cessation. She is not interested in quitting or cutting back.   4 . Tobacco Abuse.  - Advised cessation.   5. H/o AKI  - BMET today.   6. NSVT - Asymptomatic. - Occasional PVCs on ECG today. - BMET today.  7. SODH - Gifted $25 gift card to pick up refills at pharmacy. - Patient assistance for Entresto re-started and she was given samples today.   Follow up in 4 weeks with APP (add SGLT2i) and 12 weeks with Dr. Celesta Gentile + echo.  Banks, FNP-BC. 08/15/21

## 2021-08-16 ENCOUNTER — Other Ambulatory Visit (HOSPITAL_COMMUNITY): Payer: Self-pay | Admitting: *Deleted

## 2021-08-16 ENCOUNTER — Other Ambulatory Visit (HOSPITAL_COMMUNITY): Payer: Self-pay

## 2021-08-16 DIAGNOSIS — F5101 Primary insomnia: Secondary | ICD-10-CM | POA: Diagnosis not present

## 2021-08-16 DIAGNOSIS — R7989 Other specified abnormal findings of blood chemistry: Secondary | ICD-10-CM | POA: Diagnosis not present

## 2021-08-16 DIAGNOSIS — N179 Acute kidney failure, unspecified: Secondary | ICD-10-CM | POA: Diagnosis not present

## 2021-08-16 DIAGNOSIS — E871 Hypo-osmolality and hyponatremia: Secondary | ICD-10-CM | POA: Diagnosis not present

## 2021-08-16 DIAGNOSIS — Z0001 Encounter for general adult medical examination with abnormal findings: Secondary | ICD-10-CM | POA: Diagnosis not present

## 2021-08-16 MED ORDER — SACUBITRIL-VALSARTAN 24-26 MG PO TABS: 1.0000 | ORAL_TABLET | Freq: Two times a day (BID) | ORAL | 3 refills | Status: DC

## 2021-08-17 ENCOUNTER — Encounter (HOSPITAL_COMMUNITY): Payer: Medicare HMO

## 2021-08-25 ENCOUNTER — Ambulatory Visit (HOSPITAL_COMMUNITY)
Admission: RE | Admit: 2021-08-25 | Discharge: 2021-08-25 | Disposition: A | Discharge status: Home / Self Care | Payer: Medicare HMO | Source: Ambulatory Visit | Attending: Cardiology | Admitting: Cardiology

## 2021-08-25 DIAGNOSIS — I5042 Chronic combined systolic (congestive) and diastolic (congestive) heart failure: Secondary | ICD-10-CM | POA: Insufficient documentation

## 2021-08-25 LAB — BASIC METABOLIC PANEL
Anion gap: 9 (ref 5–15)
BUN: 17 mg/dL (ref 8–23)
CO2: 23 mmol/L (ref 22–32)
Calcium: 9.3 mg/dL (ref 8.9–10.3)
Chloride: 103 mmol/L (ref 98–111)
Creatinine, Ser: 1.01 mg/dL — ABNORMAL HIGH (ref 0.44–1.00)
GFR, Estimated: 59 mL/min — ABNORMAL LOW (ref >60)
Glucose, Bld: 187 mg/dL — ABNORMAL HIGH (ref 70–99)
Potassium: 4 mmol/L (ref 3.5–5.1)
Sodium: 135 mmol/L (ref 135–145)

## 2021-09-11 ENCOUNTER — Telehealth (HOSPITAL_COMMUNITY): Payer: Self-pay

## 2021-09-11 NOTE — Telephone Encounter (Signed)
Called and was unable to leave patient a voice message to confirm/remind patient of their appointment at the [facility] on 09/12/21.

## 2021-09-12 ENCOUNTER — Other Ambulatory Visit (HOSPITAL_COMMUNITY): Payer: Self-pay

## 2021-09-12 ENCOUNTER — Encounter (HOSPITAL_COMMUNITY): Payer: Self-pay

## 2021-09-12 ENCOUNTER — Ambulatory Visit (HOSPITAL_COMMUNITY)
Admission: RE | Admit: 2021-09-12 | Discharge: 2021-09-12 | Disposition: A | Discharge status: Home / Self Care | Payer: Medicare HMO | Source: Ambulatory Visit | Attending: Family Medicine | Admitting: Family Medicine

## 2021-09-12 VITALS — BP 124/62 | HR 74 | Wt 142.6 lb

## 2021-09-12 DIAGNOSIS — J449 Chronic obstructive pulmonary disease, unspecified: Secondary | ICD-10-CM | POA: Insufficient documentation

## 2021-09-12 DIAGNOSIS — Z79899 Other long term (current) drug therapy: Secondary | ICD-10-CM | POA: Diagnosis not present

## 2021-09-12 DIAGNOSIS — I081 Rheumatic disorders of both mitral and tricuspid valves: Secondary | ICD-10-CM | POA: Insufficient documentation

## 2021-09-12 DIAGNOSIS — R42 Dizziness and giddiness: Secondary | ICD-10-CM | POA: Diagnosis not present

## 2021-09-12 DIAGNOSIS — I428 Other cardiomyopathies: Secondary | ICD-10-CM | POA: Diagnosis not present

## 2021-09-12 DIAGNOSIS — F17210 Nicotine dependence, cigarettes, uncomplicated: Secondary | ICD-10-CM | POA: Diagnosis not present

## 2021-09-12 DIAGNOSIS — I4729 Other ventricular tachycardia: Secondary | ICD-10-CM | POA: Diagnosis not present

## 2021-09-12 DIAGNOSIS — I340 Nonrheumatic mitral (valve) insufficiency: Secondary | ICD-10-CM | POA: Diagnosis not present

## 2021-09-12 DIAGNOSIS — Z139 Encounter for screening, unspecified: Secondary | ICD-10-CM

## 2021-09-12 DIAGNOSIS — F1010 Alcohol abuse, uncomplicated: Secondary | ICD-10-CM | POA: Diagnosis not present

## 2021-09-12 DIAGNOSIS — I5042 Chronic combined systolic (congestive) and diastolic (congestive) heart failure: Secondary | ICD-10-CM

## 2021-09-12 DIAGNOSIS — I472 Ventricular tachycardia, unspecified: Secondary | ICD-10-CM | POA: Insufficient documentation

## 2021-09-12 DIAGNOSIS — Z720 Tobacco use: Secondary | ICD-10-CM

## 2021-09-12 LAB — BASIC METABOLIC PANEL
Anion gap: 6 (ref 5–15)
BUN: 14 mg/dL (ref 8–23)
CO2: 31 mmol/L (ref 22–32)
Calcium: 9.5 mg/dL (ref 8.9–10.3)
Chloride: 103 mmol/L (ref 98–111)
Creatinine, Ser: 1.08 mg/dL — ABNORMAL HIGH (ref 0.44–1.00)
GFR, Estimated: 55 mL/min — ABNORMAL LOW (ref >60)
Glucose, Bld: 115 mg/dL — ABNORMAL HIGH (ref 70–99)
Potassium: 3.5 mmol/L (ref 3.5–5.1)
Sodium: 140 mmol/L (ref 135–145)

## 2021-09-12 NOTE — Progress Notes (Signed)
Advanced Heart Failure Clinic Note   Primary Care: Norm Salt, PA Primary Cardiologist: [name], MD HF Cardiologist: Dr. [name]  HPI: Emma Hahn is a 71 y.o. female with a history of ETOH abuse, tobacco abuse, MR, COPD, NICM, and HFrEF. She has been followed by Dr. [name] and was recently referred to Dr. [name] for consideration of a MitraClip for her severe MR.    She developed dyspnea and chest pressure in 5/21, echo performed and recommended outpatient cardiology evaluation, but she declined.  She had worsening SOB and chest pressure, prompting hospitalization in 11/21. Found to have severe LV dysfunction with LVEF 25 to 30%, severe mitral and tricuspid regurgitation. [facility] 12/08/19 showed no obstructive CAD, mild pulmonary hypertension (mean PA pressure of 32 mmHg), mean PCWP 27 mmHg, with a v-wave of 33. Fick was low 2.5/1.5 GDMT imited by low BP.   TEE 12/09/19 EF 25-30% predominantly central MR with mild posterior component due to tethering of posterior MV leaflet.  Saw Dr [name] 07/2020. Entresto increased to 49-51 bid.   She canceled f/u appointment 9/22 and no showed 11/23.    Admitted 6/23 with a/c CHF after stopping all meds for past 6 months. Started on IV lasix. Echo showed EF 45-50%, severe MR, and LVH. GDMT limited by low BP and AKI. Discharged home, weight 138 lbs.  Post hospital follow up, out of Entresto x 1 week and drinking beer daily.   Today she returns for HF follow up. Overall feeling fine. She is not SOB with activity, but not very active. Main issue is dizziness. Drinks 1-2 air plane bottles of liquor on weekends. Smoke 4 cigs/day. Denies palpitations, CP,  edema, or PND/Orthopnea. Appetite poor. No fever or chills. She does not weigh at home.     Cardiac Studies - cMRI (6/23): c/w severe NICM, LVEF 22%, RVEF 35%   - Echo (6/23): EF 45-50%, Left atrial enlargement, severe MR, and LVH.   - TEE (11//21): EF 25-30%  predominantly central MR with mild posterior component due to tethering of posterior MV leaflet  - R/LHC (11/21): no obstructive CAD. Mild pulmonary hypertension with a mean PA pressure of 32 mmHg, mean PCWP 27 mmHg, with a v-wave of 33. CO/CI (Fick) was low 2.5/1.5  ROS: All systems reviewed and negative except as per HPI.   Past Medical History:  Diagnosis Date   Borderline diabetes    COPD (chronic obstructive pulmonary disease) (HCC)    Habitual alcohol use    Tobacco abuse    Current Outpatient Medications  Medication Sig Dispense Refill   albuterol (VENTOLIN HFA) 108 (90 Base) MCG/ACT inhaler Inhale 2 puffs into the lungs as needed for wheezing or shortness of breath.     aspirin EC 81 MG tablet Take 1 tablet (81 mg total) by mouth daily. Swallow whole. 30 tablet 11   atorvastatin (LIPITOR) 40 MG tablet Take 1 tablet (40 mg total) by mouth daily. 30 tablet 6   furosemide (LASIX) 40 MG tablet Take 1 tablet (40 mg total) by mouth 2 (two) times daily. 60 tablet 6   nitroGLYCERIN (NITROSTAT) 0.4 MG SL tablet Place 1 tablet (0.4 mg total) under the tongue every 5 (five) minutes as needed for chest pain. 30 tablet 11   sacubitril-valsartan (ENTRESTO) 24-26 MG Take 1 tablet by mouth 2 (two) times daily. 180 tablet 3   spironolactone (ALDACTONE) 25 MG tablet Take 1/2 tablet (12.5 mg total) by mouth daily. 15 tablet 3   No  current facility-administered medications for this encounter.   No Known Allergies  Social History   Socioeconomic History   Marital status: Single    Spouse name: Not on file   Number of children: Not on file   Years of education: Not on file   Highest education level: Not on file  Occupational History   Not on file  Tobacco Use   Smoking status: Every Day    Types: Cigarettes   Smokeless tobacco: Never  Substance and Sexual Activity   Alcohol use: Yes    Comment: Usually drinks vodka or gin or beer, 3 days out of the week   Drug use: Never   Sexual  activity: Not on file  Other Topics Concern   Not on file  Social History Narrative   Not on file   Social Determinants of Health   Financial Resource Strain: Medium Risk (07/11/2021)   Overall Financial Resource Strain (CARDIA)    Difficulty of Paying Living Expenses: Somewhat hard  Food Insecurity: No Food Insecurity (07/11/2021)   Hunger Vital Sign    Worried About Running Out of Food in the Last Year: Never true    Ran Out of Food in the Last Year: Never true  Transportation Needs: No Transportation Needs (07/11/2021)   PRAPARE - Administrator, Civil Service (Medical): No    Lack of Transportation (Non-Medical): No  Physical Activity: Not on file  Stress: Not on file  Social Connections: Not on file  Intimate Partner Violence: Not on file   Family History  Problem Relation Age of Onset   Stroke Maternal Grandmother        ? Possible heart disease too - patient unsure   BP 124/62   Pulse 74   Wt 64.7 kg (142 lb 9.6 oz)   SpO2 99%   BMI 24.48 kg/m   Wt Readings from Last 3 Encounters:  09/12/21 64.7 kg (142 lb 9.6 oz)  08/15/21 64.8 kg (142 lb 12.8 oz)  07/14/21 65.2 kg (143 lb 12.8 oz)   PHYSICAL EXAM: General:  NAD. No resp difficulty, fatigued-appearing. HEENT: Normal Neck: Supple. No JVD. Carotids 2+ bilat; no bruits. No lymphadenopathy or thryomegaly appreciated. Cor: PMI nondisplaced. Regular rate & rhythm. No rubs, gallops, 2/6 MR Lungs: Clear Abdomen: Soft, nontender, nondistended. No hepatosplenomegaly. No bruits or masses. Good bowel sounds. Extremities: No cyanosis, clubbing, rash, edema Neuro: Alert & oriented x 3, cranial nerves grossly intact. Moves all 4 extremities w/o difficulty. Slurring speech.  ASSESSMENT & PLAN: 1. Chronic HFmEF - NICM in the setting of severe MR and ETOH abuse.  - Recent admit 7/23 after being off all meds x 6 months. - Echo (7/23): EF ~ 45% which is up from previous EF 25-30% back in 2021.  MR remains severe.  She has declined mitral clip. - cMRI (7/23): c/w severe NICM, LVEF 22%, RVEF 35%  - NYHA II. Volume OK. - Hold off on adding SGLT2i over concern for volume depletion w/ dizziness & ETOH abuse  - Continue Entresto 24/26 mg bid. - Continue spiro 12.5 mg daily. - Continue Lasix 40 mg bid.  - Labs today. - I asked her to weigh daily, she has a scale.   2. MR, Severe - Evaluated back in 2021 for mitral clip however she did not want to pursue.  - Echo this admit (7/23) demonstrated severe MR.  - She refuses any MV interventions.    3. ETOH Abuse - Advised cessation. She is not  interested in quitting or cutting back. - Needs a daily MV + thiamine.   4 . Tobacco Abuse  - Advised cessation.   5. NSVT - Asymptomatic. - BMET today.  6. SODH - She has patient assistance for Entresto. Given samples today.   Follow up in 2-3 months with Dr. Celesta Gentile + echo.  [place], FNP-BC. 09/12/21

## 2021-09-12 NOTE — Progress Notes (Signed)
Medication Samples have been provided to the patient.  Drug name: Entresto       Strength: 24/26        Qty: 28  LOT: [id number]  Exp.Date: 08/2023  Dosing instructions: Patient takes 1 tablet by mouth twice a day.  The patient has been instructed regarding the correct time, dose, and frequency of taking this medication, including desired effects and most common side effects.   Novella Rob Manika Hast 9:58 AM 09/12/2021

## 2021-09-12 NOTE — Patient Instructions (Signed)
It was great to see you today! No medication changes are needed at this time.   Labs today We will only contact you if something comes back abnormal or we need to make some changes. Otherwise no news is good news!   Your physician recommends that you schedule a follow-up appointment in: 2-3 months with Dr [name] and echo   Your physician has requested that you have an echocardiogram. Echocardiography is a painless test that uses sound waves to create images of your heart. It provides your doctor with information about the size and shape of your heart and how well your heart's chambers and valves are working. This procedure takes approximately one hour. There are no restrictions for this procedure.   Do the following things EVERYDAY: Weigh yourself in the morning before breakfast. Write it down and keep it in a log. Take your medicines as prescribed Eat low salt foods--Limit salt (sodium) to 2000 mg per day.  Stay as active as you can everyday Limit all fluids for the day to less than 2 liters  At the [facility], you and your health needs are our priority. As part of our continuing mission to provide you with exceptional heart care, we have created designated Provider Care Teams. These Care Teams include your primary Cardiologist (physician) and Advanced Practice Providers (APPs- Physician Assistants and Nurse Practitioners) who all work together to provide you with the care you need, when you need it.   You may see any of the following providers on your designated Care Team at your next follow up: Dr Arvilla Meres Dr Marca Ancona Dr. Marcos Eke, NP Robbie Lis, [place] [facility] [place], [place] Brynda Peon, NP Karle Plumber, PharmD   Please be sure to bring in all your medications bottles to every appointment.   If you have any questions or concerns before your next appointment please send [place] a message through [place] or call our  office at [phone].    TO LEAVE A MESSAGE FOR THE NURSE SELECT OPTION 2, PLEASE LEAVE A MESSAGE INCLUDING: YOUR NAME DATE OF BIRTH CALL BACK NUMBER REASON FOR CALL**this is important as we prioritize the call backs  YOU WILL RECEIVE A CALL BACK THE SAME DAY AS LONG AS YOU CALL BEFORE 4:00 PM

## 2021-10-11 ENCOUNTER — Telehealth (HOSPITAL_COMMUNITY): Payer: Self-pay | Admitting: Pharmacy Technician

## 2021-10-11 NOTE — Telephone Encounter (Signed)
Advanced Heart Failure Patient Advocate Encounter  Mandy with [facility] called inquiring if patient has help with Entresto. Called [phone] and spoke with [facility]. I emailed the patient a second copy of the grant on 09/20. Provided [employer] over the phone.  No further action needed at this time.  Charlann Boxer, CPhT

## 2021-10-14 DIAGNOSIS — I25118 Atherosclerotic heart disease of native coronary artery with other forms of angina pectoris: Secondary | ICD-10-CM | POA: Diagnosis not present

## 2021-10-14 DIAGNOSIS — I5022 Chronic systolic (congestive) heart failure: Secondary | ICD-10-CM | POA: Diagnosis not present

## 2021-10-14 DIAGNOSIS — I10 Essential (primary) hypertension: Secondary | ICD-10-CM | POA: Diagnosis not present

## 2021-10-15 IMAGING — MG MM DIGITAL SCREENING BILAT W/ TOMO AND CAD
8 series · 8 of 24 positions shown · non-contrast
Comparison: None.

CLINICAL DATA: Screening.

EXAM:
DIGITAL SCREENING BILATERAL MAMMOGRAM WITH TOMOSYNTHESIS AND CAD
TECHNIQUE: Bilateral screening digital craniocaudal and mediolateral oblique
mammograms were obtained. Bilateral screening digital breast
tomosynthesis was performed. The images were evaluated with
computer-aided detection.

[L CC synth-2D]
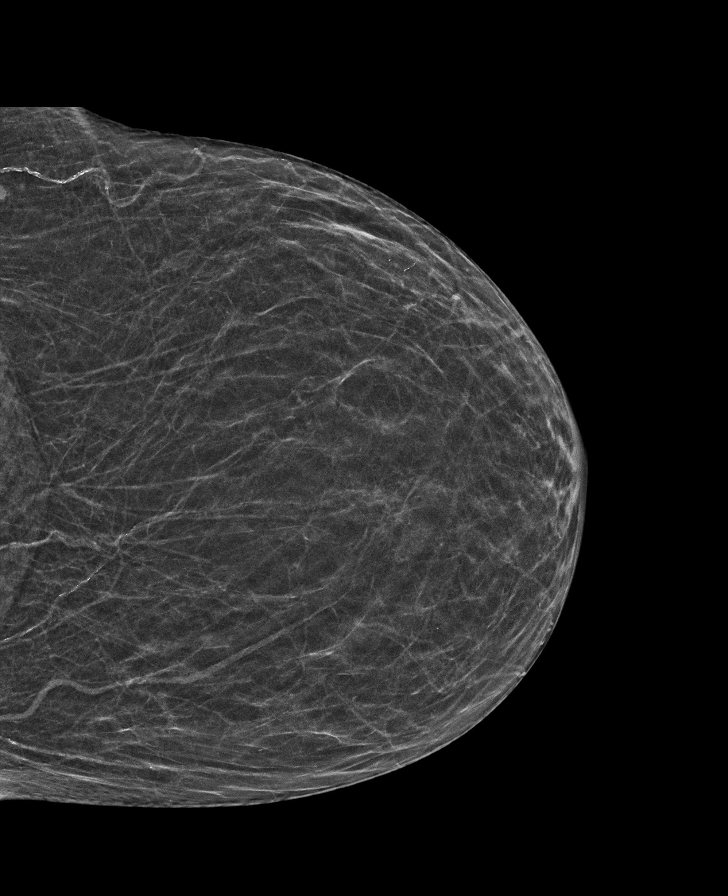

[R MLO synth-2D]
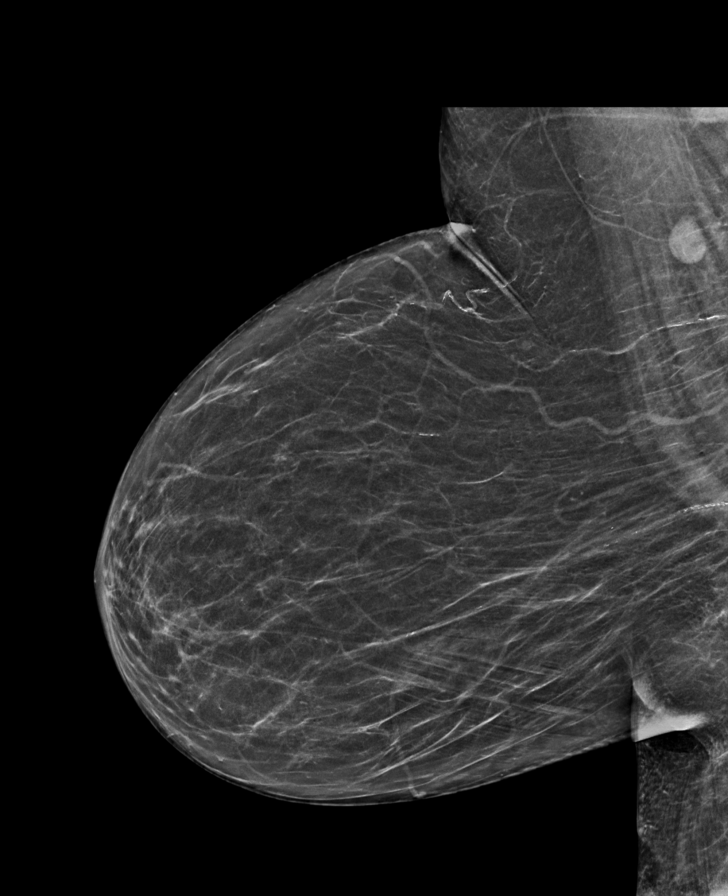

[L MLO synth-2D]
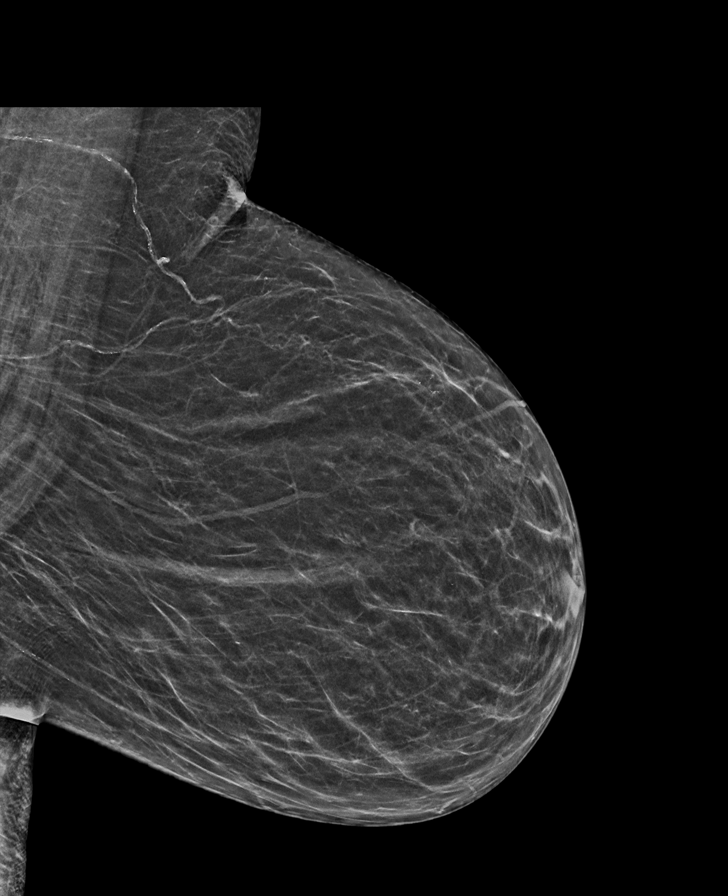

[R CC synth-2D]
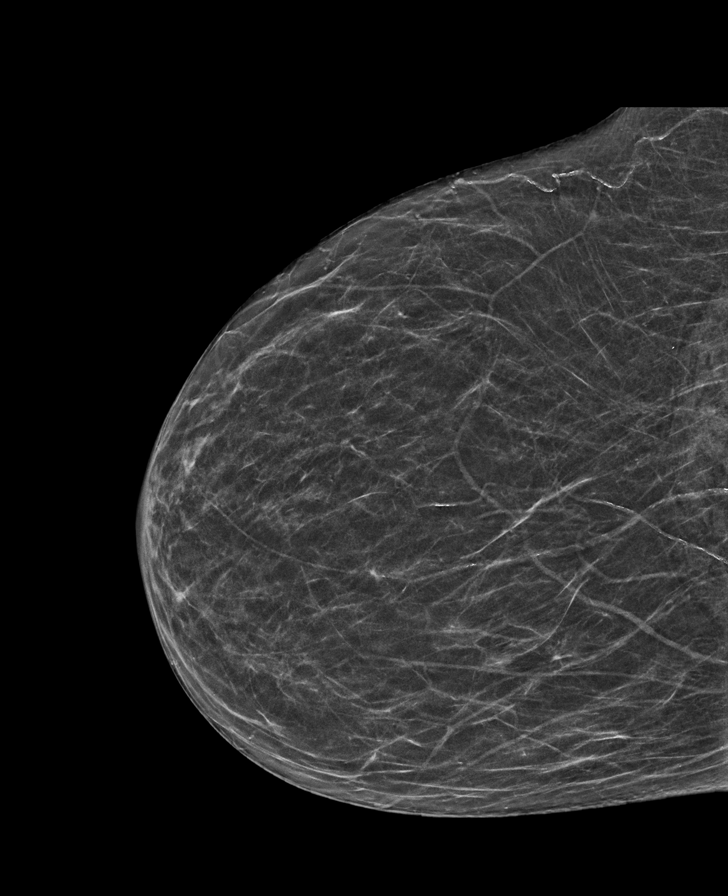

[R MLO tomo · tomo slice 33/66.0]
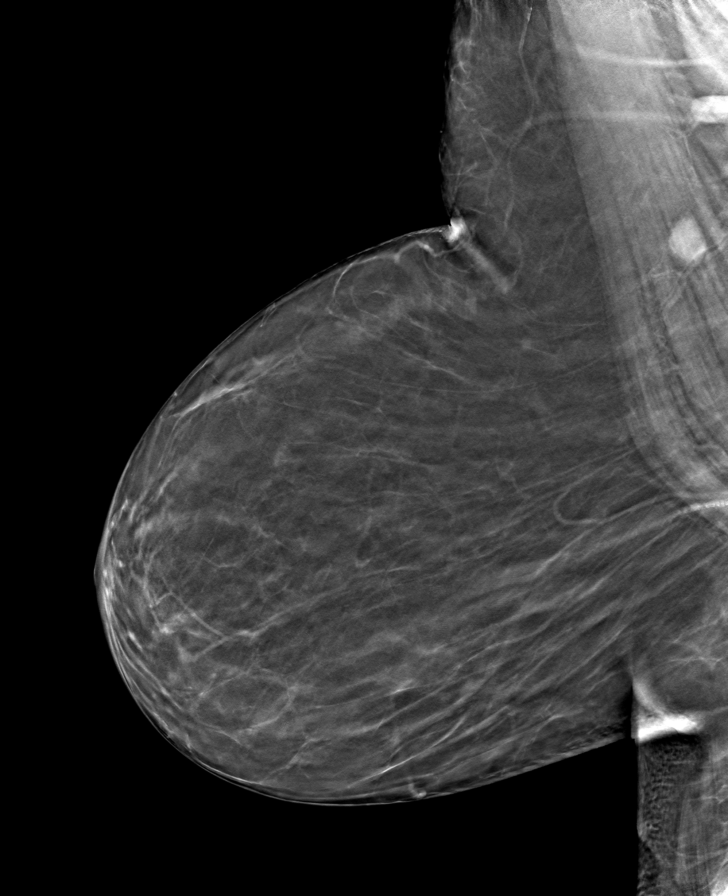

[R CC tomo · tomo slice 27/54.0]
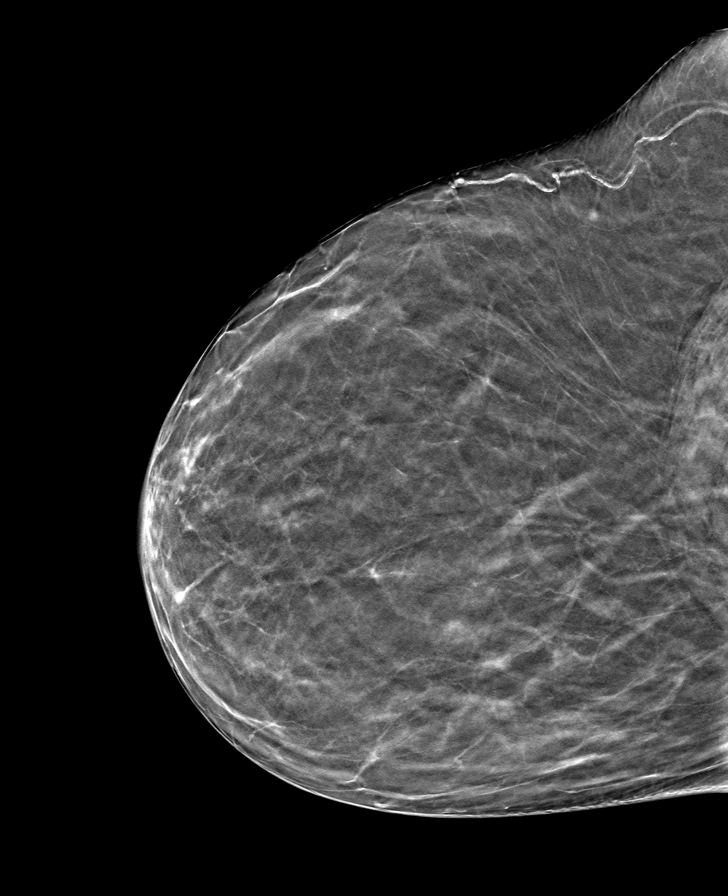

[L MLO tomo · tomo slice 29/58.0]
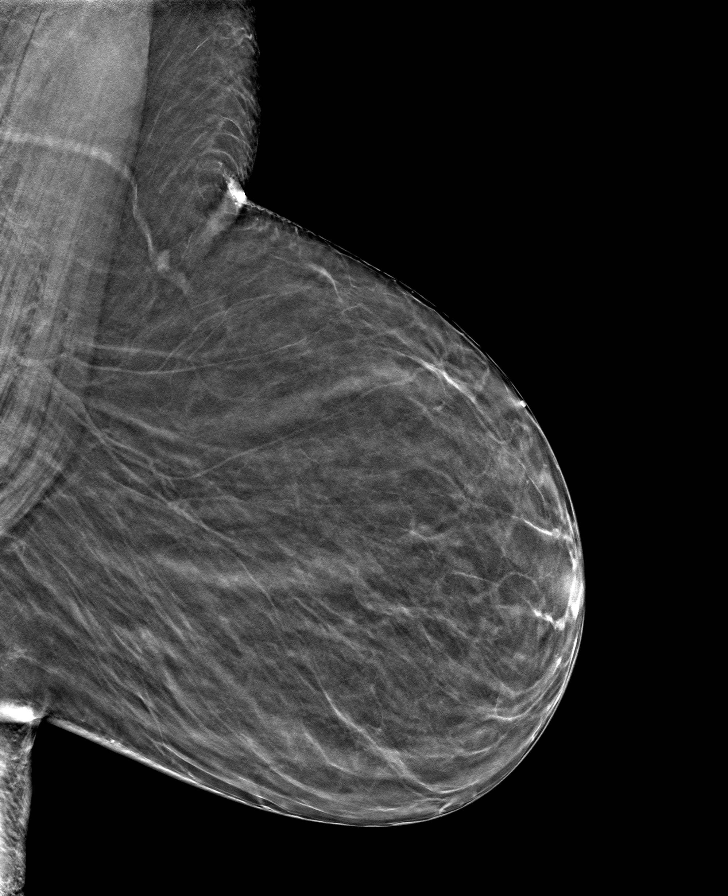

[L CC tomo · tomo slice 26/51.0]
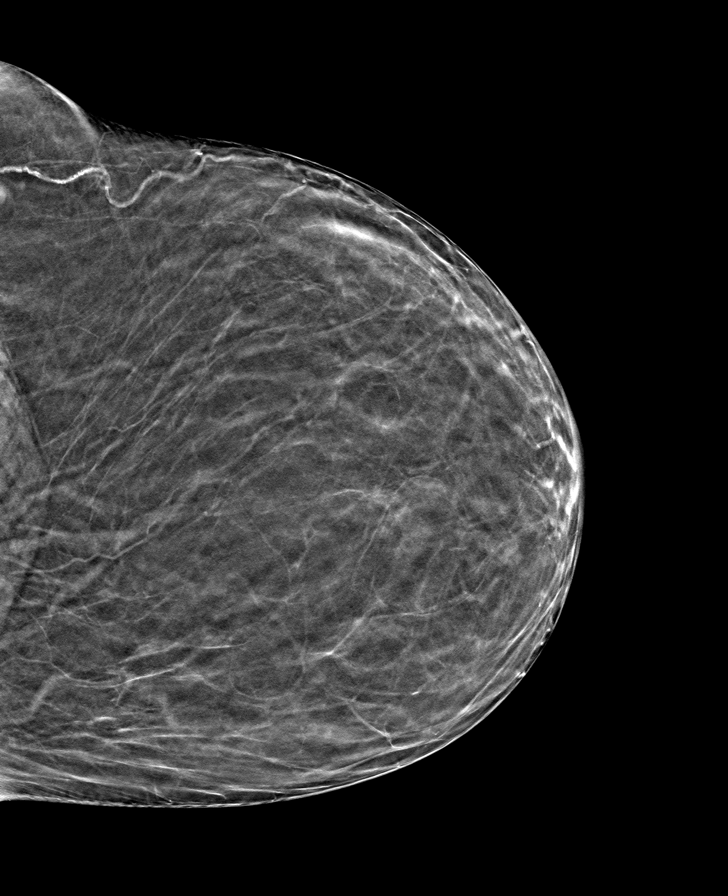

[8 of 24 positions shown; findings below may reference images not displayed]

ACR Breast Density Category b: There are scattered areas of
fibroglandular density.
FINDINGS: There are no findings suspicious for malignancy.
IMPRESSION: No mammographic evidence of malignancy. A result letter of this
screening mammogram will be mailed directly to the patient.

RECOMMENDATION:
Screening mammogram in one year. (Code:[id number]-[id number])

BI-RADS CATEGORY  1: Negative.

## 2021-11-08 DIAGNOSIS — E7800 Pure hypercholesterolemia, unspecified: Secondary | ICD-10-CM | POA: Diagnosis not present

## 2021-11-08 DIAGNOSIS — I10 Essential (primary) hypertension: Secondary | ICD-10-CM | POA: Diagnosis not present

## 2021-11-08 DIAGNOSIS — I5042 Chronic combined systolic (congestive) and diastolic (congestive) heart failure: Secondary | ICD-10-CM | POA: Diagnosis not present

## 2021-11-08 DIAGNOSIS — N179 Acute kidney failure, unspecified: Secondary | ICD-10-CM | POA: Diagnosis not present

## 2021-11-08 DIAGNOSIS — J418 Mixed simple and mucopurulent chronic bronchitis: Secondary | ICD-10-CM | POA: Diagnosis not present

## 2021-11-08 DIAGNOSIS — E871 Hypo-osmolality and hyponatremia: Secondary | ICD-10-CM | POA: Diagnosis not present

## 2021-11-08 DIAGNOSIS — F5101 Primary insomnia: Secondary | ICD-10-CM | POA: Diagnosis not present

## 2021-11-08 DIAGNOSIS — Z0001 Encounter for general adult medical examination with abnormal findings: Secondary | ICD-10-CM | POA: Diagnosis not present

## 2021-11-08 DIAGNOSIS — R7989 Other specified abnormal findings of blood chemistry: Secondary | ICD-10-CM | POA: Diagnosis not present

## 2021-11-14 DIAGNOSIS — I5022 Chronic systolic (congestive) heart failure: Secondary | ICD-10-CM | POA: Diagnosis not present

## 2021-11-14 DIAGNOSIS — I25118 Atherosclerotic heart disease of native coronary artery with other forms of angina pectoris: Secondary | ICD-10-CM | POA: Diagnosis not present

## 2021-11-14 DIAGNOSIS — I10 Essential (primary) hypertension: Secondary | ICD-10-CM | POA: Diagnosis not present

## 2021-11-22 DIAGNOSIS — I10 Essential (primary) hypertension: Secondary | ICD-10-CM | POA: Diagnosis not present

## 2021-11-22 DIAGNOSIS — J418 Mixed simple and mucopurulent chronic bronchitis: Secondary | ICD-10-CM | POA: Diagnosis not present

## 2021-11-22 DIAGNOSIS — I5042 Chronic combined systolic (congestive) and diastolic (congestive) heart failure: Secondary | ICD-10-CM | POA: Diagnosis not present

## 2021-11-22 DIAGNOSIS — E7800 Pure hypercholesterolemia, unspecified: Secondary | ICD-10-CM | POA: Diagnosis not present

## 2021-12-13 ENCOUNTER — Encounter (HOSPITAL_COMMUNITY): Payer: Self-pay | Admitting: Internal Medicine

## 2021-12-13 ENCOUNTER — Ambulatory Visit (HOSPITAL_BASED_OUTPATIENT_CLINIC_OR_DEPARTMENT_OTHER)
Admission: RE | Admit: 2021-12-13 | Discharge: 2021-12-13 | Disposition: A | Discharge status: Home / Self Care | Payer: Medicare HMO | Source: Ambulatory Visit | Attending: Internal Medicine | Admitting: Internal Medicine

## 2021-12-13 ENCOUNTER — Ambulatory Visit (HOSPITAL_COMMUNITY)
Admission: RE | Admit: 2021-12-13 | Discharge: 2021-12-13 | Disposition: A | Discharge status: Home / Self Care | Payer: Medicare HMO | Source: Ambulatory Visit | Attending: Physician Assistant | Admitting: Physician Assistant

## 2021-12-13 VITALS — BP 112/68 | HR 94 | Wt 139.4 lb

## 2021-12-13 DIAGNOSIS — I3139 Other pericardial effusion (noninflammatory): Secondary | ICD-10-CM | POA: Insufficient documentation

## 2021-12-13 DIAGNOSIS — I272 Pulmonary hypertension, unspecified: Secondary | ICD-10-CM | POA: Insufficient documentation

## 2021-12-13 DIAGNOSIS — F1010 Alcohol abuse, uncomplicated: Secondary | ICD-10-CM | POA: Insufficient documentation

## 2021-12-13 DIAGNOSIS — I080 Rheumatic disorders of both mitral and aortic valves: Secondary | ICD-10-CM | POA: Diagnosis not present

## 2021-12-13 DIAGNOSIS — Z79899 Other long term (current) drug therapy: Secondary | ICD-10-CM | POA: Insufficient documentation

## 2021-12-13 DIAGNOSIS — Z720 Tobacco use: Secondary | ICD-10-CM | POA: Diagnosis not present

## 2021-12-13 DIAGNOSIS — F17210 Nicotine dependence, cigarettes, uncomplicated: Secondary | ICD-10-CM | POA: Diagnosis not present

## 2021-12-13 DIAGNOSIS — R42 Dizziness and giddiness: Secondary | ICD-10-CM | POA: Insufficient documentation

## 2021-12-13 DIAGNOSIS — I472 Ventricular tachycardia, unspecified: Secondary | ICD-10-CM | POA: Insufficient documentation

## 2021-12-13 DIAGNOSIS — M79606 Pain in leg, unspecified: Secondary | ICD-10-CM | POA: Insufficient documentation

## 2021-12-13 DIAGNOSIS — I428 Other cardiomyopathies: Secondary | ICD-10-CM | POA: Insufficient documentation

## 2021-12-13 DIAGNOSIS — I5042 Chronic combined systolic (congestive) and diastolic (congestive) heart failure: Secondary | ICD-10-CM

## 2021-12-13 DIAGNOSIS — E785 Hyperlipidemia, unspecified: Secondary | ICD-10-CM | POA: Diagnosis not present

## 2021-12-13 DIAGNOSIS — J449 Chronic obstructive pulmonary disease, unspecified: Secondary | ICD-10-CM | POA: Insufficient documentation

## 2021-12-13 LAB — ECHOCARDIOGRAM COMPLETE
Area-P 1/2: 2.1 cm2
S' Lateral: 3.5 cm

## 2021-12-13 LAB — COMPREHENSIVE METABOLIC PANEL
ALT: 18 U/L (ref 0–44)
AST: 32 U/L (ref 15–41)
Albumin: 3.4 g/dL — ABNORMAL LOW (ref 3.5–5.0)
Alkaline Phosphatase: 41 U/L (ref 38–126)
Anion gap: 8 (ref 5–15)
BUN: 9 mg/dL (ref 8–23)
CO2: 29 mmol/L (ref 22–32)
Calcium: 9.7 mg/dL (ref 8.9–10.3)
Chloride: 101 mmol/L (ref 98–111)
Creatinine, Ser: 0.86 mg/dL (ref 0.44–1.00)
GFR, Estimated: >60 mL/min (ref >60)
Glucose, Bld: 140 mg/dL — ABNORMAL HIGH (ref 70–99)
Potassium: 3.9 mmol/L (ref 3.5–5.1)
Sodium: 138 mmol/L (ref 135–145)
Total Bilirubin: 0.3 mg/dL (ref 0.3–1.2)
Total Protein: 7.7 g/dL (ref 6.5–8.1)

## 2021-12-13 LAB — CBC
HCT: 36.2 % (ref 36.0–46.0)
Hemoglobin: 11.1 g/dL — ABNORMAL LOW (ref 12.0–15.0)
MCH: 27.2 pg (ref 26.0–34.0)
MCHC: 30.7 g/dL (ref 30.0–36.0)
MCV: 88.7 fL (ref 80.0–100.0)
Platelets: 291 10*3/uL (ref 150–400)
RBC: 4.08 MIL/uL (ref 3.87–5.11)
RDW: 14.1 % (ref 11.5–15.5)
WBC: 6.6 10*3/uL (ref 4.0–10.5)
nRBC: 0 % (ref 0.0–0.2)

## 2021-12-13 LAB — BRAIN NATRIURETIC PEPTIDE: B Natriuretic Peptide: 47 pg/mL (ref 0.0–100.0)

## 2021-12-13 NOTE — Patient Instructions (Signed)
Continue current medications  Non-Cardiac CT scanning, (CAT scanning), is a noninvasive, special x-ray that produces cross-sectional images of the body using x-rays and a computer. CT scans help physicians diagnose and treat medical conditions. For some CT exams, a contrast material is used to enhance visibility in the area of the body being studied. CT scans provide greater clarity and reveal more details than regular x-ray exams. You will be called to schedule this once insurance has approved  Your physician recommends that you schedule a follow-up appointment in: 4 months  If you have any questions or concerns before your next appointment please send [place] a message through [place] or call our office at [phone].    TO LEAVE A MESSAGE FOR THE NURSE SELECT OPTION 2, PLEASE LEAVE A MESSAGE INCLUDING: YOUR NAME DATE OF BIRTH CALL BACK NUMBER REASON FOR CALL**this is important as we prioritize the call backs  YOU WILL RECEIVE A CALL BACK THE SAME DAY AS LONG AS YOU CALL BEFORE 4:00 PM

## 2021-12-13 NOTE — Progress Notes (Signed)
Advanced Heart Failure Clinic Note   Primary Care: Norm Salt, PA Primary Cardiologist: [name], MD HF Cardiologist: Dr. [name]  HPI: Emma Hahn is a 70 y.o. female with a history of ETOH abuse, tobacco abuse, MR, COPD, NICM, and HFrEF. She has been followed by Dr. [name] and was recently referred to Dr. [name] for consideration of a MitraClip for her severe MR.    She developed dyspnea and chest pressure in 5/21, echo performed and recommended outpatient cardiology evaluation, but she declined.  She had worsening SOB and chest pressure, prompting hospitalization in 11/21. Found to have severe LV dysfunction with LVEF 25 to 30%, severe mitral and tricuspid regurgitation. [facility] 12/08/19 showed no obstructive CAD, mild pulmonary hypertension (mean PA pressure of 32 mmHg), mean PCWP 27 mmHg, with a v-wave of 33. Fick was low 2.5/1.5 GDMT imited by low BP.   TEE 12/09/19 EF 25-30% predominantly central MR with mild posterior component due to tethering of posterior MV leaflet.  Saw Dr [name] 07/2020. Entresto increased to 49-51 bid.   She canceled f/u appointment 9/22 and no showed 11/23.    Admitted 6/23 with a/c CHF after stopping all meds for past 6 months. Started on IV lasix. Echo showed EF 45-50%, severe MR, and LVH. GDMT limited by low BP and AKI. Discharged home, weight 138 lbs.  Post hospital follow up, out of Entresto x 1 week and drinking beer daily.   Echo today 12/13/21 EF 30-35%  Today she returns for HF follow up. Says she feels ok. This past week doing better but still struggles with ADLs. SOB with mild activity. Struggles with back and leg pain. Still drinking small vodka bottles and beer - won't quantify. Smoking a few cigs per day. Taking her meds regularly. Gets dizzy at times.      Cardiac Studies - cMRI (6/23): c/w severe NICM, LVEF 22%, RVEF 35%   - Echo (6/23): EF 45-50%, Left atrial enlargement, severe MR, and LVH.   -  TEE (11//21): EF 25-30% predominantly central MR with mild posterior component due to tethering of posterior MV leaflet  - R/LHC (11/21): no obstructive CAD. Mild pulmonary hypertension with a mean PA pressure of 32 mmHg, mean PCWP 27 mmHg, with a v-wave of 33. CO/CI (Fick) was low 2.5/1.5  ROS: All systems reviewed and negative except as per HPI.   Past Medical History:  Diagnosis Date   Borderline diabetes    COPD (chronic obstructive pulmonary disease) (HCC)    Habitual alcohol use    Tobacco abuse    Current Outpatient Medications  Medication Sig Dispense Refill   albuterol (VENTOLIN HFA) 108 (90 Base) MCG/ACT inhaler Inhale 2 puffs into the lungs as needed for wheezing or shortness of breath.     aspirin EC 81 MG tablet Take 1 tablet (81 mg total) by mouth daily. Swallow whole. 30 tablet 11   atorvastatin (LIPITOR) 40 MG tablet Take 1 tablet (40 mg total) by mouth daily. 30 tablet 6   furosemide (LASIX) 40 MG tablet Take 1 tablet (40 mg total) by mouth 2 (two) times daily. 60 tablet 6   nitroGLYCERIN (NITROSTAT) 0.4 MG SL tablet Place 1 tablet (0.4 mg total) under the tongue every 5 (five) minutes as needed for chest pain. 30 tablet 11   sacubitril-valsartan (ENTRESTO) 24-26 MG Take 1 tablet by mouth 2 (two) times daily. 180 tablet 3   spironolactone (ALDACTONE) 25 MG tablet Take 1/2 tablet (12.5 mg total) by mouth  daily. 15 tablet 3   No current facility-administered medications for this encounter.   No Known Allergies  Social History   Socioeconomic History   Marital status: Single    Spouse name: Not on file   Number of children: Not on file   Years of education: Not on file   Highest education level: Not on file  Occupational History   Not on file  Tobacco Use   Smoking status: Every Day    Types: Cigarettes   Smokeless tobacco: Never  Substance and Sexual Activity   Alcohol use: Yes    Comment: Usually drinks vodka or gin or beer, 3 days out of the week   Drug  use: Never   Sexual activity: Not on file  Other Topics Concern   Not on file  Social History Narrative   Not on file   Social Determinants of Health   Financial Resource Strain: Medium Risk (07/11/2021)   Overall Financial Resource Strain (CARDIA)    Difficulty of Paying Living Expenses: Somewhat hard  Food Insecurity: No Food Insecurity (07/11/2021)   Hunger Vital Sign    Worried About Running Out of Food in the Last Year: Never true    Ran Out of Food in the Last Year: Never true  Transportation Needs: No Transportation Needs (07/11/2021)   PRAPARE - Hydrologist (Medical): No    Lack of Transportation (Non-Medical): No  Physical Activity: Not on file  Stress: Not on file  Social Connections: Not on file  Intimate Partner Violence: Not on file   Family History  Problem Relation Age of Onset   Stroke Maternal Grandmother        ? Possible heart disease too - patient unsure   BP 112/68   Pulse 94   Wt 63.2 kg (139 lb 6.4 oz)   SpO2 97%   BMI 23.93 kg/m   Wt Readings from Last 3 Encounters:  12/13/21 63.2 kg (139 lb 6.4 oz)  09/12/21 64.7 kg (142 lb 9.6 oz)  08/15/21 64.8 kg (142 lb 12.8 oz)   PHYSICAL EXAM: General:  Thin. Elderly. No resp difficulty HEENT: normal Neck: supple. no JVD. Carotids 2+ bilat; no bruits. No lymphadenopathy or thryomegaly appreciated. Cor: PMI nondisplaced. Regular rate & rhythm. No rubs, gallops or murmurs. Lungs: decreased throughout Abdomen: soft, nontender, nondistended. No hepatosplenomegaly. No bruits or masses. Good bowel sounds. Extremities: no cyanosis, clubbing, rash, edema Neuro: alert & orientedx3, cranial nerves grossly intact. moves all 4 extremities w/o difficulty. Affect pleasant  ASSESSMENT & PLAN: 1. Chronic HFmEF - NICM in the setting of severe MR and ETOH abuse.  - Cath 11/21 no CAD - Recent admit 7/23 after being off all meds x 6 months. - Echo (7/23): EF ~ 45% which is up from previous  EF 25-30% back in 2021.  MR remains severe. She has declined mitral clip. - cMRI (7/23): c/w severe NICM, LVEF 22%, RVEF 35%  - Echo today 12/13/21 EF 30-35% Mild MR - Stable NYHA III. Volume ok - Hold off on adding SGLT2i over concern for volume depletion w/ dizziness & ETOH abuse  - Continue Entresto 24/26 mg bid. - Continue spiro 12.5 mg daily. - Continue Lasix 40 mg bid.  - Labs today. - Not candidate for ICD with ongoing ETOU abuse   2. MR, Severe - Evaluated back in 2021 for mitral clip however she did not want to pursue.  - Echo (7/23) demonstrated severe MR.  - She  refuses any MV interventions.  -- Echo today 12/13/21 EF 30-35% Mild MR   3. ETOH Abuse - trying to cut back.  - we discussed that it can affect her heart   4 . Tobacco Abuse  - Advised cessation.   5. NSVT - Labs today   6. Pericardial effusion - moderate on echo. No tamponade - will check auto-immune labs and CT chest  - repeat echo 2 months  7. SODH - She has patient assistance for Entresto.    Glori Bickers, MD  10:30 AM

## 2021-12-13 NOTE — Progress Notes (Signed)
  Echocardiogram 2D Echocardiogram has been performed.  Leta Jungling M 12/13/2021, 9:50 AM

## 2021-12-14 LAB — RHEUMATOID FACTOR: Rheumatoid fact SerPl-aCnc: 24 IU/mL — ABNORMAL HIGH (ref <14.0)

## 2021-12-15 LAB — ANCA TITERS
Atypical P-ANCA titer: 1:160 {titer} — ABNORMAL HIGH
C-ANCA: <1:20 {titer}
P-ANCA: <1:20 {titer}

## 2022-01-04 ENCOUNTER — Other Ambulatory Visit (HOSPITAL_COMMUNITY): Payer: Self-pay | Admitting: Family Medicine

## 2022-02-06 ENCOUNTER — Other Ambulatory Visit (HOSPITAL_COMMUNITY): Payer: Self-pay | Admitting: Family Medicine

## 2022-03-07 DIAGNOSIS — I5042 Chronic combined systolic (congestive) and diastolic (congestive) heart failure: Secondary | ICD-10-CM | POA: Diagnosis not present

## 2022-03-07 DIAGNOSIS — I110 Hypertensive heart disease with heart failure: Secondary | ICD-10-CM | POA: Diagnosis not present

## 2022-03-07 DIAGNOSIS — I10 Essential (primary) hypertension: Secondary | ICD-10-CM | POA: Diagnosis not present

## 2022-03-07 DIAGNOSIS — R7303 Prediabetes: Secondary | ICD-10-CM | POA: Diagnosis not present

## 2022-03-07 DIAGNOSIS — J4489 Other specified chronic obstructive pulmonary disease: Secondary | ICD-10-CM | POA: Diagnosis not present

## 2022-03-07 DIAGNOSIS — E7800 Pure hypercholesterolemia, unspecified: Secondary | ICD-10-CM | POA: Diagnosis not present

## 2022-04-13 ENCOUNTER — Encounter (HOSPITAL_COMMUNITY): Payer: Medicare HMO | Admitting: Internal Medicine

## 2022-04-20 ENCOUNTER — Other Ambulatory Visit (HOSPITAL_COMMUNITY): Payer: Self-pay | Admitting: Family Medicine

## 2022-05-16 ENCOUNTER — Other Ambulatory Visit (HOSPITAL_COMMUNITY): Payer: Self-pay

## 2022-05-29 ENCOUNTER — Other Ambulatory Visit (HOSPITAL_COMMUNITY): Payer: Self-pay

## 2022-06-08 ENCOUNTER — Other Ambulatory Visit (HOSPITAL_COMMUNITY): Payer: Self-pay | Admitting: Family Medicine

## 2022-07-23 ENCOUNTER — Other Ambulatory Visit (HOSPITAL_COMMUNITY): Payer: Self-pay | Admitting: Family Medicine

## 2022-08-18 ENCOUNTER — Other Ambulatory Visit (HOSPITAL_COMMUNITY): Payer: Self-pay | Admitting: Family Medicine

## 2022-08-22 ENCOUNTER — Other Ambulatory Visit (HOSPITAL_COMMUNITY): Payer: Self-pay | Admitting: Family Medicine

## 2022-08-30 ENCOUNTER — Other Ambulatory Visit (HOSPITAL_COMMUNITY): Payer: Self-pay | Admitting: Family Medicine

## 2022-12-04 ENCOUNTER — Other Ambulatory Visit (HOSPITAL_COMMUNITY): Payer: Self-pay

## 2022-12-04 ENCOUNTER — Telehealth (HOSPITAL_COMMUNITY): Payer: Self-pay

## 2022-12-04 DIAGNOSIS — I5042 Chronic combined systolic (congestive) and diastolic (congestive) heart failure: Secondary | ICD-10-CM

## 2022-12-04 MED ORDER — SPIRONOLACTONE 25 MG PO TABS: 12.5000 mg | ORAL_TABLET | Freq: Every day | ORAL | 0 refills | Status: DC

## 2022-12-04 MED ORDER — ENTRESTO 24-26 MG PO TABS: 1.0000 | ORAL_TABLET | Freq: Two times a day (BID) | ORAL | 0 refills | Status: DC

## 2022-12-04 MED ORDER — FUROSEMIDE 40 MG PO TABS: 40.0000 mg | ORAL_TABLET | Freq: Two times a day (BID) | ORAL | 0 refills | Status: DC

## 2022-12-04 NOTE — Telephone Encounter (Signed)
30 day supply of patients medications has been sent to her pharmacy.

## 2022-12-26 ENCOUNTER — Other Ambulatory Visit (HOSPITAL_COMMUNITY): Payer: Self-pay | Admitting: Family Medicine

## 2022-12-26 DIAGNOSIS — I5042 Chronic combined systolic (congestive) and diastolic (congestive) heart failure: Secondary | ICD-10-CM

## 2023-03-04 ENCOUNTER — Encounter (HOSPITAL_COMMUNITY): Payer: Self-pay

## 2023-03-04 ENCOUNTER — Emergency Department (HOSPITAL_COMMUNITY): Payer: Medicare Other

## 2023-03-04 ENCOUNTER — Other Ambulatory Visit: Payer: Self-pay

## 2023-03-04 ENCOUNTER — Inpatient Hospital Stay (HOSPITAL_COMMUNITY)
Admission: EM | Admit: 2023-03-04 | Discharge: 2023-03-08 | DRG: 291 | Disposition: A | Discharge status: Home / Self Care | Payer: Medicare Other | Attending: Internal Medicine | Admitting: Internal Medicine

## 2023-03-04 DIAGNOSIS — F419 Anxiety disorder, unspecified: Secondary | ICD-10-CM | POA: Diagnosis present

## 2023-03-04 DIAGNOSIS — I5023 Acute on chronic systolic (congestive) heart failure: Secondary | ICD-10-CM | POA: Diagnosis present

## 2023-03-04 DIAGNOSIS — I110 Hypertensive heart disease with heart failure: Principal | ICD-10-CM | POA: Diagnosis present

## 2023-03-04 DIAGNOSIS — Z66 Do not resuscitate: Secondary | ICD-10-CM | POA: Diagnosis present

## 2023-03-04 DIAGNOSIS — K589 Irritable bowel syndrome without diarrhea: Secondary | ICD-10-CM | POA: Diagnosis present

## 2023-03-04 DIAGNOSIS — E785 Hyperlipidemia, unspecified: Secondary | ICD-10-CM

## 2023-03-04 DIAGNOSIS — I472 Ventricular tachycardia, unspecified: Secondary | ICD-10-CM | POA: Diagnosis not present

## 2023-03-04 DIAGNOSIS — F17210 Nicotine dependence, cigarettes, uncomplicated: Secondary | ICD-10-CM | POA: Diagnosis present

## 2023-03-04 DIAGNOSIS — N179 Acute kidney failure, unspecified: Secondary | ICD-10-CM | POA: Diagnosis present

## 2023-03-04 DIAGNOSIS — Z1152 Encounter for screening for COVID-19: Secondary | ICD-10-CM

## 2023-03-04 DIAGNOSIS — I272 Pulmonary hypertension, unspecified: Secondary | ICD-10-CM | POA: Diagnosis present

## 2023-03-04 DIAGNOSIS — E871 Hypo-osmolality and hyponatremia: Secondary | ICD-10-CM | POA: Diagnosis not present

## 2023-03-04 DIAGNOSIS — Z7982 Long term (current) use of aspirin: Secondary | ICD-10-CM

## 2023-03-04 DIAGNOSIS — J441 Chronic obstructive pulmonary disease with (acute) exacerbation: Secondary | ICD-10-CM | POA: Diagnosis not present

## 2023-03-04 DIAGNOSIS — Z91119 Patient's noncompliance with dietary regimen due to unspecified reason: Secondary | ICD-10-CM

## 2023-03-04 DIAGNOSIS — J810 Acute pulmonary edema: Secondary | ICD-10-CM | POA: Diagnosis not present

## 2023-03-04 DIAGNOSIS — Z91148 Patient's other noncompliance with medication regimen for other reason: Secondary | ICD-10-CM

## 2023-03-04 DIAGNOSIS — Z823 Family history of stroke: Secondary | ICD-10-CM

## 2023-03-04 DIAGNOSIS — F1010 Alcohol abuse, uncomplicated: Secondary | ICD-10-CM | POA: Diagnosis present

## 2023-03-04 DIAGNOSIS — I081 Rheumatic disorders of both mitral and tricuspid valves: Secondary | ICD-10-CM | POA: Diagnosis present

## 2023-03-04 DIAGNOSIS — R7303 Prediabetes: Secondary | ICD-10-CM | POA: Diagnosis present

## 2023-03-04 DIAGNOSIS — I5043 Acute on chronic combined systolic (congestive) and diastolic (congestive) heart failure: Principal | ICD-10-CM | POA: Diagnosis present

## 2023-03-04 DIAGNOSIS — I10 Essential (primary) hypertension: Secondary | ICD-10-CM

## 2023-03-04 DIAGNOSIS — I5042 Chronic combined systolic (congestive) and diastolic (congestive) heart failure: Secondary | ICD-10-CM

## 2023-03-04 DIAGNOSIS — Z789 Other specified health status: Secondary | ICD-10-CM | POA: Diagnosis present

## 2023-03-04 DIAGNOSIS — R0902 Hypoxemia: Secondary | ICD-10-CM | POA: Diagnosis present

## 2023-03-04 DIAGNOSIS — I340 Nonrheumatic mitral (valve) insufficiency: Secondary | ICD-10-CM | POA: Diagnosis present

## 2023-03-04 DIAGNOSIS — F1090 Alcohol use, unspecified, uncomplicated: Secondary | ICD-10-CM | POA: Diagnosis present

## 2023-03-04 DIAGNOSIS — I428 Other cardiomyopathies: Secondary | ICD-10-CM | POA: Diagnosis present

## 2023-03-04 DIAGNOSIS — Z79899 Other long term (current) drug therapy: Secondary | ICD-10-CM

## 2023-03-04 DIAGNOSIS — I3139 Other pericardial effusion (noninflammatory): Secondary | ICD-10-CM | POA: Diagnosis present

## 2023-03-04 LAB — RESP PANEL BY RT-PCR (RSV, FLU A&B, COVID)  RVPGX2
Influenza A by PCR: NEGATIVE
Influenza B by PCR: NEGATIVE
Resp Syncytial Virus by PCR: NEGATIVE
SARS Coronavirus 2 by RT PCR: NEGATIVE

## 2023-03-04 LAB — BASIC METABOLIC PANEL
Anion gap: 10 (ref 5–15)
BUN: 9 mg/dL (ref 8–23)
CO2: 21 mmol/L — ABNORMAL LOW (ref 22–32)
Calcium: 9.1 mg/dL (ref 8.9–10.3)
Chloride: 106 mmol/L (ref 98–111)
Creatinine, Ser: 0.83 mg/dL (ref 0.44–1.00)
GFR, Estimated: >60 mL/min (ref >60)
Glucose, Bld: 128 mg/dL — ABNORMAL HIGH (ref 70–99)
Potassium: 3.6 mmol/L (ref 3.5–5.1)
Sodium: 137 mmol/L (ref 135–145)

## 2023-03-04 LAB — CBC
HCT: 33.1 % — ABNORMAL LOW (ref 36.0–46.0)
Hemoglobin: 10.2 g/dL — ABNORMAL LOW (ref 12.0–15.0)
MCH: 26.6 pg (ref 26.0–34.0)
MCHC: 30.8 g/dL (ref 30.0–36.0)
MCV: 86.2 fL (ref 80.0–100.0)
Platelets: 331 10*3/uL (ref 150–400)
RBC: 3.84 MIL/uL — ABNORMAL LOW (ref 3.87–5.11)
RDW: 15.3 % (ref 11.5–15.5)
WBC: 8.5 10*3/uL (ref 4.0–10.5)
nRBC: 0 % (ref 0.0–0.2)

## 2023-03-04 LAB — BRAIN NATRIURETIC PEPTIDE: B Natriuretic Peptide: 1700.2 pg/mL — ABNORMAL HIGH (ref 0.0–100.0)

## 2023-03-04 LAB — TROPONIN I (HIGH SENSITIVITY)
Troponin I (High Sensitivity): 47 ng/L — ABNORMAL HIGH (ref <18)
Troponin I (High Sensitivity): 53 ng/L — ABNORMAL HIGH (ref <18)

## 2023-03-04 MED ORDER — PREDNISONE 20 MG PO TABS
40.0000 mg | ORAL_TABLET | Freq: Every day | ORAL | Status: DC
  Administered 2023-03-05 – 2023-03-06 (×2): 40 mg via ORAL
  Filled 2023-03-04 (×2): qty 2

## 2023-03-04 MED ORDER — ATORVASTATIN CALCIUM 40 MG PO TABS
40.0000 mg | ORAL_TABLET | Freq: Every day | ORAL | Status: DC
  Administered 2023-03-04 – 2023-03-08 (×5): 40 mg via ORAL
  Filled 2023-03-04 (×5): qty 1

## 2023-03-04 MED ORDER — SENNOSIDES-DOCUSATE SODIUM 8.6-50 MG PO TABS
1.0000 | ORAL_TABLET | Freq: Every evening | ORAL | Status: DC | PRN
  Administered 2023-03-06: 1 via ORAL
  Filled 2023-03-04 (×2): qty 1

## 2023-03-04 MED ORDER — FUROSEMIDE 10 MG/ML IJ SOLN
40.0000 mg | Freq: Two times a day (BID) | INTRAMUSCULAR | Status: DC
  Administered 2023-03-04 – 2023-03-06 (×4): 40 mg via INTRAVENOUS
  Filled 2023-03-04 (×4): qty 4

## 2023-03-04 MED ORDER — ENOXAPARIN SODIUM 40 MG/0.4ML IJ SOSY
40.0000 mg | PREFILLED_SYRINGE | INTRAMUSCULAR | Status: DC
  Administered 2023-03-04 – 2023-03-06 (×3): 40 mg via SUBCUTANEOUS
  Filled 2023-03-04 (×4): qty 0.4

## 2023-03-04 MED ORDER — ACETAMINOPHEN 325 MG PO TABS: 650.0000 mg | ORAL_TABLET | Freq: Four times a day (QID) | ORAL | Status: DC | PRN

## 2023-03-04 MED ORDER — MOMETASONE FURO-FORMOTEROL FUM 100-5 MCG/ACT IN AERO: 2.0000 | INHALATION_SPRAY | Freq: Two times a day (BID) | RESPIRATORY_TRACT | Status: DC

## 2023-03-04 MED ORDER — ASPIRIN 81 MG PO TBEC
81.0000 mg | DELAYED_RELEASE_TABLET | Freq: Every day | ORAL | Status: DC
  Administered 2023-03-04 – 2023-03-08 (×5): 81 mg via ORAL
  Filled 2023-03-04 (×5): qty 1

## 2023-03-04 MED ORDER — SPIRONOLACTONE 12.5 MG HALF TABLET
12.5000 mg | ORAL_TABLET | Freq: Every day | ORAL | Status: DC
  Administered 2023-03-04 – 2023-03-08 (×5): 12.5 mg via ORAL
  Filled 2023-03-04 (×5): qty 1

## 2023-03-04 MED ORDER — IPRATROPIUM-ALBUTEROL 0.5-2.5 (3) MG/3ML IN SOLN
3.0000 mL | Freq: Four times a day (QID) | RESPIRATORY_TRACT | Status: DC | PRN
  Administered 2023-03-04 – 2023-03-07 (×3): 3 mL via RESPIRATORY_TRACT
  Filled 2023-03-04 (×4): qty 3

## 2023-03-04 MED ORDER — FUROSEMIDE 10 MG/ML IJ SOLN
40.0000 mg | INTRAMUSCULAR | Status: AC
  Administered 2023-03-04: 40 mg via INTRAVENOUS
  Filled 2023-03-04: qty 4

## 2023-03-04 MED ORDER — POTASSIUM CHLORIDE CRYS ER 20 MEQ PO TBCR
40.0000 meq | EXTENDED_RELEASE_TABLET | Freq: Once | ORAL | Status: AC
  Administered 2023-03-04: 40 meq via ORAL
  Filled 2023-03-04: qty 2

## 2023-03-04 MED ORDER — SACUBITRIL-VALSARTAN 24-26 MG PO TABS
1.0000 | ORAL_TABLET | Freq: Two times a day (BID) | ORAL | Status: DC
  Administered 2023-03-04 – 2023-03-07 (×8): 1 via ORAL
  Filled 2023-03-04 (×9): qty 1

## 2023-03-04 MED ORDER — ACETAMINOPHEN 650 MG RE SUPP: 650.0000 mg | Freq: Four times a day (QID) | RECTAL | Status: DC | PRN

## 2023-03-04 MED ORDER — METHYLPREDNISOLONE SODIUM SUCC 125 MG IJ SOLR
125.0000 mg | Freq: Once | INTRAMUSCULAR | Status: AC
  Administered 2023-03-04: 125 mg via INTRAVENOUS
  Filled 2023-03-04: qty 2

## 2023-03-04 NOTE — H&P (Signed)
History and Physical  Emma Hahn WUX:[id number] DOB: [birth year] DOA: 03/04/2023  PCP: Norm Salt, PA   Chief Complaint: Shortness of breath, cough  HPI: Emma Hahn is a 73 y.o. female with medical history significant for HTN, HLD, combined systolic and diastolic heart failure, pericardial effusion and COPD who presents to the ED for evaluation of shortness of breath and cough. Patient reports his symptoms worsened on Thursday.  She has had progressive dyspnea on exertion over the last few weeks but on Thursday, she started having more shortness of breath.  She also reports a cough that was productive.  She has had associated orthopnea as well as some chest discomfort when she lays on her right side but denies any fevers, chills, leg swelling, abdominal pain, nausea, vomiting, palpitations or dizziness.  Patient reports she has not been taking her medications consistently.  She does not have a restroom downstairs where her kitchen is so she only takes her Lasix when she is upstairs closer to the bathroom.  ED Course: Initial vitals showed temp 97.3, RR 22, HR 107, BP 145/86 and SpO2 95% on room air. Labs significant for mild anemia with Hgb of 10.2 but normal white count, normal kidney function, troponin 47->53, BNP 1,700, negative COVID, flu and RSV test.  EKG shows sinus rhythm with APCs and biatrial enlargement. CXR shows CHF pattern.  Patient was given IV Lasix 40 mg x 1. TRH was consulted for admission  Review of Systems: Please see HPI for pertinent positives and negatives. A complete 10 system review of systems are otherwise negative.  Past Medical History:  Diagnosis Date   Borderline diabetes    COPD (chronic obstructive pulmonary disease) (HCC)    Habitual alcohol use    Tobacco abuse    Past Surgical History:  Procedure Laterality Date   RIGHT/LEFT HEART CATH AND CORONARY ANGIOGRAPHY N/A 12/08/2019   Procedure: RIGHT/LEFT HEART CATH AND CORONARY  ANGIOGRAPHY;  Surgeon: Lennette Bihari, MD;  Location: [facility] INVASIVE CV LAB;  Service: Cardiovascular;  Laterality: N/A;   TEE WITHOUT CARDIOVERSION N/A 12/09/2019   Procedure: TRANSESOPHAGEAL ECHOCARDIOGRAM (TEE);  Surgeon: Chrystie Nose, MD;  Location: [facility] ENDOSCOPY;  Service: Cardiovascular;  Laterality: N/A;   TOTAL ABDOMINAL HYSTERECTOMY     Social History:  reports that she has been smoking cigarettes. She has never used smokeless tobacco. She reports current alcohol use. She reports that she does not use drugs.  No Known Allergies  Family History  Problem Relation Age of Onset   Stroke Maternal Grandmother        ? Possible heart disease too - patient unsure     Prior to Admission medications   Medication Sig Start Date End Date Taking? Authorizing Provider  albuterol (VENTOLIN HFA) 108 (90 Base) MCG/ACT inhaler Inhale 2 puffs into the lungs as needed for wheezing or shortness of breath.    [provider]  aspirin EC 81 MG tablet Take 1 tablet (81 mg total) by mouth daily. Swallow whole. 08/15/21   [place], Anderson Malta, FNP  atorvastatin (LIPITOR) 40 MG tablet Take 1 tablet (40 mg total) by mouth daily. NEEDS FOLLOW UP APPOINTMENT FOR MORE REFILLS 08/22/22   Bensimhon, Bevelyn Buckles, MD  furosemide (LASIX) 40 MG tablet TAKE 1 TABLET (40 MG TOTAL) BY MOUTH 2 (TWO) TIMES DAILY. NEEDS FOLLOW UP APPOINTMENT FOR MORE REFILLS 12/26/22   Bensimhon, Bevelyn Buckles, MD  nitroGLYCERIN (NITROSTAT) 0.4 MG SL tablet Place 1 tablet (0.4 mg total) under the tongue  every 5 (five) minutes as needed for chest pain. 08/15/21   [place], Anderson Malta, FNP  sacubitril-valsartan (ENTRESTO) 24-26 MG Take 1 tablet by mouth 2 (two) times daily. 12/04/22   Jacklynn Ganong, FNP  spironolactone (ALDACTONE) 25 MG tablet TAKE 1/2 TABLET EVERY DAY NEEDS FOLLOW UP APPOINTMENT FOR MORE REFILLS 12/26/22   Bensimhon, Bevelyn Buckles, MD    Physical Exam: BP 135/83   Pulse (!) 108   Temp 97.6 F (36.4 C) (Oral)   Resp (!) 24   Ht  5\' 4"  (1.626 m)   Wt 63 kg   SpO2 98%   BMI 23.86 kg/m  General: Pleasant, thin appearing elderly woman laying in bed. No acute distress. HEENT: [place]/AT. Anicteric sclera Neck: Supple. JVD to the mid neck. CV: Tachycardic. Regular rhythm with occasional extra beat. No murmurs, rubs, or gallops. No LE edema Pulmonary: Lungs CTAB. Slightly increased WOB. Mild expiratory wheezing. Bibasilar rales. Abdominal: Soft, nontender, nondistended. Normal bowel sounds. Extremities: Palpable radial and DP pulses. Normal ROM. Skin: Warm and dry. No obvious rash or lesions. Neuro: A&Ox3. Moves all extremities. Normal sensation to light touch. No focal deficit. Psych: Normal mood and affect          Labs on Admission:  Basic Metabolic Panel: Recent Labs  Lab 03/04/23 0848  NA 137  K 3.6  CL 106  CO2 21*  GLUCOSE 128*  BUN 9  CREATININE 0.83  CALCIUM 9.1   Liver Function Tests: No results for input(s): "AST", "ALT", "ALKPHOS", "BILITOT", "PROT", "ALBUMIN" in the last 168 hours. No results for input(s): "LIPASE", "AMYLASE" in the last 168 hours. No results for input(s): "AMMONIA" in the last 168 hours. CBC: Recent Labs  Lab 03/04/23 0848  WBC 8.5  HGB 10.2*  HCT 33.1*  MCV 86.2  PLT 331   Cardiac Enzymes: No results for input(s): "CKTOTAL", "CKMB", "CKMBINDEX", "TROPONINI" in the last 168 hours. BNP (last 3 results) No results for input(s): "BNP" in the last 8760 hours.  ProBNP (last 3 results) No results for input(s): "PROBNP" in the last 8760 hours.  CBG: No results for input(s): "GLUCAP" in the last 168 hours.  Radiological Exams on Admission: DG Chest 2 View Result Date: 03/04/2023 CLINICAL DATA:  Chest pain with cough EXAM: CHEST - 2 VIEW COMPARISON:  07/11/2021 FINDINGS: Symmetric interstitial opacity with cephalized blood flow and some Kerley lines/fissure thickening. Hazy airspace disease in the lower lungs. No pleural effusion or pneumothorax. Stable cardiac enlargement  and upper mediastinal convexity. IMPRESSION: CHF pattern. Electronically Signed   By: Tiburcio Pea M.D.   On: 03/04/2023 10:06   Assessment/Plan Emma Hahn is a 73 y.o. female with medical history significant for HTN, HLD, combined systolic and diastolic heart failure, pericardial effusion and COPD who presents to the ED for evaluation of shortness of breath and cough.   # Acute on chronic combined systolic and diastolic heart failure # Hx of Pericardial effusion Last TTE in November 2023 showed EF 30-35%, LV global hypokinesis, G1DD, and moderate pericardial effusion.  Patient presents with a few days of shortness of breath, orthopnea and cough found to have radiological, laboratory and clinical evidence of CHF exacerbation in the setting of medication noncompliance.  Status post IV Lasix 40 mg x 1 in the ED. Patient on room air but continues to have some increased work of breathing and tachycardia on exam. -Admit to cardiac telemetry -Follow-up repeat echocardiogram -IV Lasix 40 mg twice daily -Oral KCl 40 mEq x 1, repeat  as needed -Continue Entresto and spironolactone -Strict I&O's, daily weights -Trend and replete electrolytes -Supplemental O2 as needed  # COPD with mild exacerbation Current meds include Symbicort and as needed albuterol. Patient with progressive shortness of breath and worsening productive cough over the last few days.  Patient with mild expiratory wheezing on exam with increased work of breathing. -Give IV Solu-Medrol 125 mg x 1 then prednisone 40 mg daily for 4 days -Dulera twice daily -As needed DuoNebs -Incentive spirometer, flutter valve  # HTN BP slightly elevated with SBP in the 120s to 150s. -Continue spironolactone and Entresto  # HLD -Continue atorvastatin   DVT prophylaxis: Lovenox     Code Status: Limited: Do not attempt resuscitation (DNR) -DNR-LIMITED -Do Not Intubate/DNI   Consults called: None  Family Communication: No family at  bedside  Severity of Illness: The appropriate patient status for this patient is OBSERVATION. Observation status is judged to be reasonable and necessary in order to provide the required intensity of service to ensure the patient's safety. The patient's presenting symptoms, physical exam findings, and initial radiographic and laboratory data in the context of their medical condition is felt to place them at decreased risk for further clinical deterioration. Furthermore, it is anticipated that the patient will be medically stable for discharge from the hospital within 2 midnights of admission.   Level of care: Cardiac telemetry  Steffanie Rainwater, MD 03/04/2023, 1:46 PM [facility] Pager: [phone] Isaiah 41:10   If 7PM-7AM, please contact night-coverage [email or web address] Password [email or web address]

## 2023-03-04 NOTE — ED Provider Notes (Signed)
[facility] Provider Note   CSN: [id number] Arrival date & time: 03/04/23  3474     History  Chief Complaint  Patient presents with   Cough   Shortness of Breath    Emma Hahn is a 74 y.o. female.   Cough Associated symptoms: shortness of breath   Shortness of Breath Associated symptoms: cough    This patient is a 73 year old female, she has a medical history significant for high cholesterol, hypertension, history of combined systolic and diastolic congestive heart failure with a history of a pericardial effusion.  There is an ejection fraction of 30 to 35% with grade 1 diastolic dysfunction moderate pericardial effusion  She reports having several days of increasing coughing, this started on Thursday, 4 days later she is still coughing and having some thick phlegm that is coming up.  She does not feel particularly short of breath on exertion, she has not had fevers or chills, no nausea vomiting or diarrhea.  No headache, she does have a runny nose, there is some chest pain when she lays down, when she lays on the right side it is on the right side of her chest when she lies on the left side it is on the left side.  She denies any sick contacts    Home Medications Prior to Admission medications   Medication Sig Start Date End Date Taking? Authorizing Provider  albuterol (VENTOLIN HFA) 108 (90 Base) MCG/ACT inhaler Inhale 2 puffs into the lungs as needed for wheezing or shortness of breath.    [provider]  aspirin EC 81 MG tablet Take 1 tablet (81 mg total) by mouth daily. Swallow whole. 08/15/21   Milford, Anderson Malta, FNP  atorvastatin (LIPITOR) 40 MG tablet Take 1 tablet (40 mg total) by mouth daily. NEEDS FOLLOW UP APPOINTMENT FOR MORE REFILLS 08/22/22   Bensimhon, Bevelyn Buckles, MD  furosemide (LASIX) 40 MG tablet TAKE 1 TABLET (40 MG TOTAL) BY MOUTH 2 (TWO) TIMES DAILY. NEEDS FOLLOW UP APPOINTMENT FOR MORE REFILLS  12/26/22   Bensimhon, Bevelyn Buckles, MD  nitroGLYCERIN (NITROSTAT) 0.4 MG SL tablet Place 1 tablet (0.4 mg total) under the tongue every 5 (five) minutes as needed for chest pain. 08/15/21   Milford, [place] Malta, FNP  sacubitril-valsartan (ENTRESTO) 24-26 MG Take 1 tablet by mouth 2 (two) times daily. 12/04/22   Jacklynn Ganong, FNP  spironolactone (ALDACTONE) 25 MG tablet TAKE 1/2 TABLET EVERY DAY NEEDS FOLLOW UP APPOINTMENT FOR MORE REFILLS 12/26/22   Bensimhon, Bevelyn Buckles, MD      Allergies    Patient has no known allergies.    Review of Systems   Review of Systems  Respiratory:  Positive for cough and shortness of breath.   All other systems reviewed and are negative.   Physical Exam Updated Vital Signs BP 135/83   Pulse (!) 108   Temp 97.6 F (36.4 C) (Oral)   Resp (!) 24   Ht 1.626 m (5\' 4" )   Wt 63 kg   SpO2 98%   BMI 23.86 kg/m  Physical Exam Vitals and nursing note reviewed.  Constitutional:      General: She is not in acute distress.    Appearance: She is well-developed.  HENT:     Head: Normocephalic and atraumatic.     Nose: Congestion and rhinorrhea present.     Mouth/Throat:     Pharynx: No oropharyngeal exudate.  Eyes:     General: No  scleral icterus.       Right eye: No discharge.        Left eye: No discharge.     Conjunctiva/sclera: Conjunctivae normal.     Pupils: Pupils are equal, round, and reactive to light.  Neck:     Thyroid: No thyromegaly.     Vascular: No JVD.  Cardiovascular:     Rate and Rhythm: Normal rate and regular rhythm.     Heart sounds: Normal heart sounds. No murmur heard.    No friction rub. No gallop.  Pulmonary:     Effort: Pulmonary effort is normal. No respiratory distress.     Breath sounds: No wheezing or rales.     Comments: Decreased breath sounds bilaterally but no increased work of breathing, no wheezing rhonchi or rales, no increased work of breathing Abdominal:     General: Bowel sounds are normal. There is no  distension.     Palpations: Abdomen is soft. There is no mass.     Tenderness: There is no abdominal tenderness.  Musculoskeletal:        General: No tenderness. Normal range of motion.     Cervical back: Normal range of motion and neck supple.     Right lower leg: No edema.     Left lower leg: No edema.  Lymphadenopathy:     Cervical: No cervical adenopathy.  Skin:    General: Skin is warm and dry.     Findings: No erythema or rash.  Neurological:     Mental Status: She is alert.     Coordination: Coordination normal.  Psychiatric:        Behavior: Behavior normal.     ED Results / Procedures / Treatments   Labs (all labs ordered are listed, but only abnormal results are displayed) Labs Reviewed  BASIC METABOLIC PANEL - Abnormal; Notable for the following components:      Result Value   CO2 21 (*)    Glucose, Bld 128 (*)    All other components within normal limits  CBC - Abnormal; Notable for the following components:   RBC 3.84 (*)    Hemoglobin 10.2 (*)    HCT 33.1 (*)    All other components within normal limits  TROPONIN I (HIGH SENSITIVITY) - Abnormal; Notable for the following components:   Troponin I (High Sensitivity) 47 (*)    All other components within normal limits  TROPONIN I (HIGH SENSITIVITY) - Abnormal; Notable for the following components:   Troponin I (High Sensitivity) 53 (*)    All other components within normal limits  RESP PANEL BY RT-PCR (RSV, FLU A&B, COVID)  [id number]  BRAIN NATRIURETIC PEPTIDE  BRAIN NATRIURETIC PEPTIDE    EKG EKG Interpretation Date/Time:  Monday March 04 2023 08:44:07 EST Ventricular Rate:  100 PR Interval:  126 QRS Duration:  68 QT Interval:  378 QTC Calculation: 487 R Axis:   80  Text Interpretation: Sinus rhythm with Premature atrial complexes Biatrial enlargement Cannot rule out Anterior infarct , age undetermined Abnormal ECG When compared with ECG of 13-Dec-2021 09:54, PREVIOUS ECG IS PRESENT Confirmed by  Eber Hong ([id number]) on 03/04/2023 9:00:17 AM  Radiology DG Chest 2 View Result Date: 03/04/2023 CLINICAL DATA:  Chest pain with cough EXAM: CHEST - 2 VIEW COMPARISON:  07/11/2021 FINDINGS: Symmetric interstitial opacity with cephalized blood flow and some Kerley lines/fissure thickening. Hazy airspace disease in the lower lungs. No pleural effusion or pneumothorax. Stable cardiac enlargement and upper mediastinal convexity. IMPRESSION:  CHF pattern. Electronically Signed   By: Tiburcio Pea M.D.   On: 03/04/2023 10:06    Procedures Procedures    Medications Ordered in ED Medications  furosemide (LASIX) injection 40 mg (40 mg Intravenous Given 03/04/23 1044)    ED Course/ Medical Decision Making/ A&P                                 Medical Decision Making Amount and/or Complexity of Data Reviewed Labs: ordered. Radiology: ordered.  Risk Prescription drug management. Decision regarding hospitalization.    This patient presents to the ED for concern of ongoing coughing and some shortness of breath, this involves an extensive number of treatment options, and is a complaint that carries with it a high risk of complications and morbidity.  The differential diagnosis includes pneumonia, viral process, bronchitis, less likely be congestive heart failure, she is not fluid overloaded, she is not hypoxic and she is not febrile.  Could be some element of COPD, she has decreased lung sounds   Co morbidities that complicate the patient evaluation  Known heart failure   Additional history obtained:  Additional history obtained from medical record External records from outside source obtained and reviewed including prior echocardiogram showing CHF   Lab Tests:  I Ordered, and personally interpreted labs.  The pertinent results include: CBC without significant findings, troponin elevated at 47 and then the 53 on a repeat, she has been elevated in the low 30s in the past, negative for  COVID flu and RSV, metabolic panel with mild hyperglycemia but no renal dysfunction, no leukocytosis   Imaging Studies ordered:  I ordered imaging studies including 2 view PA and lateral chest x-ray I independently visualized and interpreted imaging which showed bilateral pulmonary edema I agree with the radiologist interpretation   Cardiac Monitoring: / EKG:  The patient was maintained on a cardiac monitor.  I personally viewed and interpreted the cardiac monitored which showed an underlying rhythm of: Sinus rhythm with ectopy   Consultations Obtained:  I requested consultation with the hospitalist,  and discussed lab and imaging findings as well as pertinent plan - they recommend: Admission to the hospital   Problem List / ED Course / Critical interventions / Medication management  The patient is requiring supplemental oxygen for hypoxia, she has what appears to be congestive heart failure with an acute exacerbation, she has acute pulmonary edema, she has been given intravenous furosemide and will need to be admitted to the hospital for further diuresis.  She is on supplemental oxygen I ordered medication including furosemide for fluid overload Reevaluation of the patient after these medicines showed that the patient improving I have reviewed the patients home medicines and have made adjustments as needed   Social Determinants of Health:  Heart failure   Test / Admission - Considered:  To the hospital         Final Clinical Impression(s) / ED Diagnoses Final diagnoses:  Acute on chronic combined systolic and diastolic congestive heart failure (HCC)  Acute pulmonary edema [facility])    Rx / DC Orders ED Discharge Orders     None         Eber Hong, MD 03/04/23 1349

## 2023-03-04 NOTE — ED Triage Notes (Addendum)
C/o productive cough, SOB, generalized weakness, runny nose, and chest pain since Thursday.  Chest pain worse with movement and laying down.

## 2023-03-05 ENCOUNTER — Observation Stay (HOSPITAL_BASED_OUTPATIENT_CLINIC_OR_DEPARTMENT_OTHER): Payer: Medicare Other

## 2023-03-05 DIAGNOSIS — I10 Essential (primary) hypertension: Secondary | ICD-10-CM | POA: Diagnosis not present

## 2023-03-05 DIAGNOSIS — I3139 Other pericardial effusion (noninflammatory): Secondary | ICD-10-CM | POA: Diagnosis present

## 2023-03-05 DIAGNOSIS — Z789 Other specified health status: Secondary | ICD-10-CM | POA: Diagnosis not present

## 2023-03-05 DIAGNOSIS — I081 Rheumatic disorders of both mitral and tricuspid valves: Secondary | ICD-10-CM | POA: Diagnosis present

## 2023-03-05 DIAGNOSIS — I110 Hypertensive heart disease with heart failure: Secondary | ICD-10-CM | POA: Diagnosis present

## 2023-03-05 DIAGNOSIS — Z7982 Long term (current) use of aspirin: Secondary | ICD-10-CM | POA: Diagnosis not present

## 2023-03-05 DIAGNOSIS — R0902 Hypoxemia: Secondary | ICD-10-CM | POA: Diagnosis present

## 2023-03-05 DIAGNOSIS — F419 Anxiety disorder, unspecified: Secondary | ICD-10-CM | POA: Diagnosis present

## 2023-03-05 DIAGNOSIS — Z91119 Patient's noncompliance with dietary regimen due to unspecified reason: Secondary | ICD-10-CM | POA: Diagnosis not present

## 2023-03-05 DIAGNOSIS — J441 Chronic obstructive pulmonary disease with (acute) exacerbation: Secondary | ICD-10-CM | POA: Diagnosis present

## 2023-03-05 DIAGNOSIS — Z1152 Encounter for screening for COVID-19: Secondary | ICD-10-CM | POA: Diagnosis not present

## 2023-03-05 DIAGNOSIS — I472 Ventricular tachycardia, unspecified: Secondary | ICD-10-CM | POA: Diagnosis not present

## 2023-03-05 DIAGNOSIS — K589 Irritable bowel syndrome without diarrhea: Secondary | ICD-10-CM | POA: Diagnosis present

## 2023-03-05 DIAGNOSIS — I272 Pulmonary hypertension, unspecified: Secondary | ICD-10-CM | POA: Diagnosis present

## 2023-03-05 DIAGNOSIS — I428 Other cardiomyopathies: Secondary | ICD-10-CM | POA: Diagnosis present

## 2023-03-05 DIAGNOSIS — N179 Acute kidney failure, unspecified: Secondary | ICD-10-CM | POA: Diagnosis present

## 2023-03-05 DIAGNOSIS — Z823 Family history of stroke: Secondary | ICD-10-CM | POA: Diagnosis not present

## 2023-03-05 DIAGNOSIS — R0600 Dyspnea, unspecified: Secondary | ICD-10-CM

## 2023-03-05 DIAGNOSIS — E871 Hypo-osmolality and hyponatremia: Secondary | ICD-10-CM | POA: Diagnosis not present

## 2023-03-05 DIAGNOSIS — I340 Nonrheumatic mitral (valve) insufficiency: Secondary | ICD-10-CM | POA: Diagnosis not present

## 2023-03-05 DIAGNOSIS — J810 Acute pulmonary edema: Secondary | ICD-10-CM | POA: Diagnosis present

## 2023-03-05 DIAGNOSIS — Z66 Do not resuscitate: Secondary | ICD-10-CM | POA: Diagnosis present

## 2023-03-05 DIAGNOSIS — Z79899 Other long term (current) drug therapy: Secondary | ICD-10-CM | POA: Diagnosis not present

## 2023-03-05 DIAGNOSIS — I5023 Acute on chronic systolic (congestive) heart failure: Secondary | ICD-10-CM | POA: Diagnosis not present

## 2023-03-05 DIAGNOSIS — E785 Hyperlipidemia, unspecified: Secondary | ICD-10-CM | POA: Diagnosis present

## 2023-03-05 DIAGNOSIS — I5043 Acute on chronic combined systolic (congestive) and diastolic (congestive) heart failure: Secondary | ICD-10-CM | POA: Diagnosis present

## 2023-03-05 DIAGNOSIS — F1010 Alcohol abuse, uncomplicated: Secondary | ICD-10-CM | POA: Diagnosis present

## 2023-03-05 DIAGNOSIS — F17210 Nicotine dependence, cigarettes, uncomplicated: Secondary | ICD-10-CM | POA: Diagnosis present

## 2023-03-05 DIAGNOSIS — R7303 Prediabetes: Secondary | ICD-10-CM | POA: Diagnosis present

## 2023-03-05 LAB — BASIC METABOLIC PANEL
Anion gap: 11 (ref 5–15)
BUN: 19 mg/dL (ref 8–23)
CO2: 22 mmol/L (ref 22–32)
Calcium: 9 mg/dL (ref 8.9–10.3)
Chloride: 103 mmol/L (ref 98–111)
Creatinine, Ser: 0.99 mg/dL (ref 0.44–1.00)
GFR, Estimated: 60 mL/min — ABNORMAL LOW (ref >60)
Glucose, Bld: 259 mg/dL — ABNORMAL HIGH (ref 70–99)
Potassium: 4.1 mmol/L (ref 3.5–5.1)
Sodium: 136 mmol/L (ref 135–145)

## 2023-03-05 LAB — I-STAT ARTERIAL BLOOD GAS, ED
Acid-base deficit: 1 mmol/L (ref 0.0–2.0)
Bicarbonate: 22.2 mmol/L (ref 20.0–28.0)
Calcium, Ion: 1.22 mmol/L (ref 1.15–1.40)
HCT: 33 % — ABNORMAL LOW (ref 36.0–46.0)
Hemoglobin: 11.2 g/dL — ABNORMAL LOW (ref 12.0–15.0)
O2 Saturation: 94 %
Patient temperature: 96.4
Potassium: 4.2 mmol/L (ref 3.5–5.1)
Sodium: 135 mmol/L (ref 135–145)
TCO2: 23 mmol/L (ref 22–32)
pCO2 arterial: 30.3 mm[Hg] — ABNORMAL LOW (ref 32–48)
pH, Arterial: 7.469 — ABNORMAL HIGH (ref 7.35–7.45)
pO2, Arterial: 62 mm[Hg] — ABNORMAL LOW (ref 83–108)

## 2023-03-05 LAB — ECHOCARDIOGRAM COMPLETE
Height: 64 in
MV VTI: 0.29 cm2
Radius: 1 cm
S' Lateral: 4.5 cm
Weight: 2224 [oz_av]

## 2023-03-05 LAB — I-STAT VENOUS BLOOD GAS, ED
Acid-Base Excess: 0 mmol/L (ref 0.0–2.0)
Bicarbonate: 23.7 mmol/L (ref 20.0–28.0)
Calcium, Ion: 1.25 mmol/L (ref 1.15–1.40)
HCT: 34 % — ABNORMAL LOW (ref 36.0–46.0)
Hemoglobin: 11.6 g/dL — ABNORMAL LOW (ref 12.0–15.0)
O2 Saturation: 64 %
Potassium: 4 mmol/L (ref 3.5–5.1)
Sodium: 134 mmol/L — ABNORMAL LOW (ref 135–145)
TCO2: 25 mmol/L (ref 22–32)
pCO2, Ven: 36.2 mm[Hg] — ABNORMAL LOW (ref 44–60)
pH, Ven: 7.425 (ref 7.25–7.43)
pO2, Ven: 32 mm[Hg] (ref 32–45)

## 2023-03-05 LAB — MAGNESIUM: Magnesium: 1.6 mg/dL — ABNORMAL LOW (ref 1.7–2.4)

## 2023-03-05 LAB — CBC
HCT: 34 % — ABNORMAL LOW (ref 36.0–46.0)
Hemoglobin: 10.8 g/dL — ABNORMAL LOW (ref 12.0–15.0)
MCH: 26.9 pg (ref 26.0–34.0)
MCHC: 31.8 g/dL (ref 30.0–36.0)
MCV: 84.6 fL (ref 80.0–100.0)
Platelets: 318 10*3/uL (ref 150–400)
RBC: 4.02 MIL/uL (ref 3.87–5.11)
RDW: 15.1 % (ref 11.5–15.5)
WBC: 5.5 10*3/uL (ref 4.0–10.5)
nRBC: 0 % (ref 0.0–0.2)

## 2023-03-05 LAB — TSH: TSH: 0.784 u[IU]/mL (ref 0.350–4.500)

## 2023-03-05 LAB — CBG MONITORING, ED: Glucose-Capillary: 240 mg/dL — ABNORMAL HIGH (ref 70–99)

## 2023-03-05 LAB — GLUCOSE, CAPILLARY: Glucose-Capillary: 241 mg/dL — ABNORMAL HIGH (ref 70–99)

## 2023-03-05 LAB — PHOSPHORUS: Phosphorus: 2.9 mg/dL (ref 2.5–4.6)

## 2023-03-05 MED ORDER — THIAMINE MONONITRATE 100 MG PO TABS
100.0000 mg | ORAL_TABLET | Freq: Every day | ORAL | Status: DC
  Administered 2023-03-05 – 2023-03-08 (×4): 100 mg via ORAL
  Filled 2023-03-05 (×4): qty 1

## 2023-03-05 MED ORDER — ADULT MULTIVITAMIN W/MINERALS CH
1.0000 | ORAL_TABLET | Freq: Every day | ORAL | Status: DC
  Administered 2023-03-05 – 2023-03-08 (×4): 1 via ORAL
  Filled 2023-03-05 (×4): qty 1

## 2023-03-05 MED ORDER — MAGNESIUM SULFATE 4 GM/100ML IV SOLN
4.0000 g | Freq: Once | INTRAVENOUS | Status: AC
  Administered 2023-03-05: 4 g via INTRAVENOUS
  Filled 2023-03-05: qty 100

## 2023-03-05 MED ORDER — FLUTICASONE PROPIONATE 50 MCG/ACT NA SUSP
2.0000 | Freq: Every day | NASAL | Status: DC
  Filled 2023-03-05 (×2): qty 16

## 2023-03-05 MED ORDER — FOLIC ACID 1 MG PO TABS
1.0000 mg | ORAL_TABLET | Freq: Every day | ORAL | Status: DC
  Administered 2023-03-05 – 2023-03-08 (×4): 1 mg via ORAL
  Filled 2023-03-05 (×4): qty 1

## 2023-03-05 MED ORDER — LORAZEPAM 2 MG/ML IJ SOLN: 1.0000 mg | INTRAMUSCULAR | Status: DC | PRN

## 2023-03-05 MED ORDER — THIAMINE HCL 100 MG/ML IJ SOLN: 100.0000 mg | Freq: Every day | INTRAMUSCULAR | Status: DC

## 2023-03-05 MED ORDER — TRAZODONE HCL 50 MG PO TABS
25.0000 mg | ORAL_TABLET | Freq: Once | ORAL | Status: AC
  Administered 2023-03-05: 25 mg via ORAL
  Filled 2023-03-05: qty 1

## 2023-03-05 MED ORDER — PANTOPRAZOLE SODIUM 40 MG PO TBEC
40.0000 mg | DELAYED_RELEASE_TABLET | Freq: Every day | ORAL | Status: DC
  Administered 2023-03-05 – 2023-03-08 (×4): 40 mg via ORAL
  Filled 2023-03-05 (×4): qty 1

## 2023-03-05 MED ORDER — BUDESONIDE 0.5 MG/2ML IN SUSP
0.5000 mg | Freq: Two times a day (BID) | RESPIRATORY_TRACT | Status: DC
  Administered 2023-03-05 – 2023-03-08 (×6): 0.5 mg via RESPIRATORY_TRACT
  Filled 2023-03-05 (×7): qty 2

## 2023-03-05 MED ORDER — IPRATROPIUM-ALBUTEROL 0.5-2.5 (3) MG/3ML IN SOLN
3.0000 mL | Freq: Three times a day (TID) | RESPIRATORY_TRACT | Status: DC
  Administered 2023-03-05: 3 mL via RESPIRATORY_TRACT
  Filled 2023-03-05: qty 3

## 2023-03-05 MED ORDER — LORATADINE 10 MG PO TABS
10.0000 mg | ORAL_TABLET | Freq: Every day | ORAL | Status: DC
  Administered 2023-03-05 – 2023-03-08 (×4): 10 mg via ORAL
  Filled 2023-03-05 (×4): qty 1

## 2023-03-05 MED ORDER — LORAZEPAM 1 MG PO TABS: 1.0000 mg | ORAL_TABLET | ORAL | Status: DC | PRN

## 2023-03-05 NOTE — Progress Notes (Signed)
Mobility Specialist Progress Note:    03/05/23 1608  Mobility  Activity Ambulated with assistance to bathroom  Level of Assistance Contact guard assist, steadying assist  Assistive Device Other (Comment) (HHA)  Distance Ambulated (ft) 20 ft  Activity Response Tolerated well  Mobility Referral Yes  Mobility visit 1 Mobility  Mobility Specialist Start Time (ACUTE ONLY) 1520  Mobility Specialist Stop Time (ACUTE ONLY) 1535  Mobility Specialist Time Calculation (min) (ACUTE ONLY) 15 min   Pt received in bed waving MS into room for assistance. Pt requested assistance going to the BR. No c/o throughout. Pt required CG and held on to furniture as she passed by. Void complete. Returned to bed w/o fault. Call bell and personal belongings in reach. All needs met.  Thompson Grayer Mobility Specialist  Please contact vis Secure Chat or  Rehab Office [phone]

## 2023-03-05 NOTE — Progress Notes (Signed)
Transition of Care [facility]) - Inpatient Brief Assessment   Patient Details  Name: Emma Hahn MRN: [id number] Date of Birth: 10/22/1950  Transition of Care [facility]) CM/SW Contact:    Oletta Cohn, RN Phone Number: 03/05/2023, 9:15 AM   Clinical Narrative: RNCM met with pt at bedside regarding discharge planning.  At this time, pt plans to return home with self care.   Transition of Care Asessment: Insurance and Status: (P) Insurance coverage has been reviewed Patient has primary care physician: (P) Yes Home environment has been reviewed: (P) lives in apartment alone, family in [name] Prior level of function:: (P) independent Prior/Current Home Services: (P) No current home services Social Drivers of Health Review: (P) SDOH reviewed no interventions necessary Readmission risk has been reviewed: (P) Yes Transition of care needs: (P) transition of care needs identified, TOC will continue to follow

## 2023-03-05 NOTE — Discharge Instructions (Addendum)
[facility] and assistance -Urban [employer] Bank: [address], [place] [address]. Phone [phone]  -[employer]: [address] [place], [place] [address]. [phone].  -Missionary Ministry: has the purpose of visiting the sick and shut-ins and provide for needs in the surrounding communities. Call [phone]. Email: [email or web address] .[email or web address] This program provides: Food box for seniors, Financial assistance, Food to meet basic nutritional needs.  -Meals on Wheels with Senior Resources: [facility] residents age 12 and over who are homebound and unable to obtain and prepare a nutritious meal for themselves are eligible for this service. There may be a waiting list in certain parts of [facility] if the route in that area is full. If you are in [facility] and [place] call [phone] to register. For all other areas call [phone] to register.  -Greater Dietitian: [email or web address]  Dealer.org  [facility])   M-F 8a-3p [address] [place], [place] [address] [phone] Services include: laundry, barbering, support groups, case management, phone & computer access, showers, AA/NA mtgs, mental health/substance abuse nurse, job skills class, disability information, VA assistance, spiritual classes, etc. Winter Shelter available when temperatures are less than 32 degrees.   HOMELESS SHELTERS [facility] at [facility]- Call [phone] ext. [phone] or ext. [phone]. Located at [address], [place], [place] [address]  [facility]- Call [phone]. Located at [address] [place] [address].  [facility] [employer]. Call [phone]. Office located at [address] [place] [address].  [facility] through [place] [phone].  [facility]- Call [phone]. Located at [address] [place], [place], [place] [address].  [facility]For Pregnant mothers. Call [phone]. Located at [address] [place], [address].  [facility]For men in [place]. Call [phone]. [facility] in [place]. Call [phone].  [facility] [employer] for [employer] [phone]. Office located at [address] [place], [address].  [employer] be agreeable to help with chores. Call [phone] ext. [phone].  Men's: [address] [place], [place] [address]. Women's: [facility]  [address] [place], [place] [address]

## 2023-03-05 NOTE — Progress Notes (Signed)
PROGRESS NOTE    Emma Hahn  UXL:[id number] DOB: [birth year] DOA: 03/04/2023 PCP: Norm Salt, PA   Chief Complaint  Patient presents with   Cough   Shortness of Breath    Brief Narrative:  Patient 73 year old female, history of chronic HFrEF, nonischemic cardiomyopathy, severe MR, history of pericardial effusion, COPD with ongoing tobacco use, hypertension, hyperlipidemia presented to the ED with cough productive of clear sputum, progressive dyspnea on exertion, orthopnea, wheezing.  Patient on admission noted to have elevated BNP, chest x-ray consistent with volume overload, patient admitted for acute CHF exacerbation and COPD exacerbation.  Due to complex cardiac history cardiology consulted.   Assessment & Plan:   Principal Problem:   Acute on chronic combined systolic and diastolic CHF (congestive heart failure) (HCC) Active Problems:   Alcohol use   MR (mitral regurgitation)   Acute pulmonary edema (HCC)   COPD with acute exacerbation (HCC)   Hypomagnesemia   HTN (hypertension)   Hyperlipidemia  #1 acute on chronic combined systolic and diastolic heart failure -Patient presenting with dyspnea on exertion, shortness of breath, orthopnea, productive cough of clear sputum. -Patient noted for prior 2D echo from November 2023 with a EF of 30 to 35%, LV global hypokinesis, G1 DD, moderate pericardial effusion, severe MR. -Patient noted with medication noncompliance.  Patient with dietary indiscretions. -Patient noted to have a BNP of one 700.2 on admission, elevated troponins somewhat flat and 47>> 53, chest x-ray consistent with volume overload. -Patient noted to be on room air.  Noted to have increased work of breathing as well as speaking in [employer]. -Repeat 2D echo with a EF of 30 to 35%, left ventricular global hypokinesis, left atrial size severely dilated, patent left atrial appendage is seen on 2 chamber view, small pericardial effusion, severe  MVR. -Patient on IV Lasix, urine output not accurately recorded, patient still with no significant change or shortness of breath. -Continue IV Lasix, Entresto, Aldactone. -Due to complex cardiac history cardiology consulted for further evaluation and management.  2 acute COPD exacerbation -Status post IV Solu-Medrol. -Continue prednisone taper. -Discontinue Dulera and placed on Pulmicort and Brovana. -Scheduled DuoNebs. -Placed on Claritin, Flonase, PPI.  3.  Severe MVR -Consult with cardiology.  4.  History of alcohol abuse -Patient denies daily alcohol use and states she uses intermittently. -Alcohol cessation stressed to patient. -Place on Ativan withdrawal protocol, thiamine, multivitamin, folic acid.  5.  Hypertension -Continue Entresto, Aldactone, IV Lasix.  6.  Hyperlipidemia -Check a fasting lipid panel -Continue statin.  7.  Hypomagnesemia -Magnesium at 1.6. -Replete. -  DVT prophylaxis: Lovenox Code Status: DNR limited Family Communication: Updated patient.  No family at bedside. Disposition: TBD  Status is: Inpatient The patient will require care spanning > 2 midnights and should be moved to inpatient because: Severity of illness   Consultants:  Cardiology  Procedures:  2D echo 03/05/2023 Chest x-ray 03/04/2023   Antimicrobials:  Anti-infectives (From admission, onward)    None         Subjective: Sitting up at the side of the bed eating breakfast.  States she is hungry.  Denies any ongoing chest pain however stated had some chest pain intermittently prior to admission and noted to have use of nitroglycerin.  States no significant improvement with shortness of breath.  When speaking noted to be speaking in some choppy sentences.  Objective: Vitals:   03/04/23 2144 03/05/23 0155 03/05/23 0400 03/05/23 0430  BP: 119/78 123/75 (!) 128/94   Pulse: Marland Kitchen)  101 86 (!) 104 77  Resp: 16 15 (!) 25 13  Temp: 97.6 F (36.4 C) (!) 97.5 F (36.4 C)     TempSrc: Oral Axillary    SpO2: 100% 100% 100% 100%  Weight:      Height:       No intake or output data in the 24 hours ending 03/05/23 0951 Filed Weights   03/04/23 1001  Weight: 63 kg    Examination:  General exam: NAD. Respiratory system: Some scattered crackles.  No wheezing.  Speaking in choppy sentences.  No rhonchi.  Cardiovascular system: Regular rate rhythm no murmurs rubs or gallops.  No JVD.  Trace lower extremity edema.  Gastrointestinal system: Abdomen is nondistended, soft and nontender. No organomegaly or masses felt. Normal bowel sounds heard. Central nervous system: Alert and oriented. No focal neurological deficits. Extremities: Trace bilateral lower extremity edema.  Symmetric 5 x 5 power. Skin: No rashes, lesions or ulcers Psychiatry: Judgement and insight appear normal. Mood & affect appropriate.     Data Reviewed: I have personally reviewed following labs and imaging studies  CBC: Recent Labs  Lab 03/04/23 0848 03/05/23 0516  WBC 8.5 5.5  HGB 10.2* 10.8*  HCT 33.1* 34.0*  MCV 86.2 84.6  PLT 331 318    Basic Metabolic Panel: Recent Labs  Lab 03/04/23 0848 03/05/23 0516  NA 137 136  K 3.6 4.1  CL 106 103  CO2 21* 22  GLUCOSE 128* 259*  BUN 9 19  CREATININE 0.83 0.99  CALCIUM 9.1 9.0  MG  --  1.6*  PHOS  --  2.9    GFR: Estimated Creatinine Clearance: 43.1 mL/min (by C-G formula based on SCr of 0.99 mg/dL).  Liver Function Tests: No results for input(s): "AST", "ALT", "ALKPHOS", "BILITOT", "PROT", "ALBUMIN" in the last 168 hours.  CBG: No results for input(s): "GLUCAP" in the last 168 hours.   Recent Results (from the past 240 hours)  Resp panel by RT-PCR (RSV, Flu A&B, Covid) Anterior Nasal Swab     Status: None   Collection Time: 03/04/23  9:59 AM   Specimen: Anterior Nasal Swab  Result Value Ref Range Status   SARS Coronavirus 2 by RT PCR NEGATIVE NEGATIVE Final   Influenza A by PCR NEGATIVE NEGATIVE Final   Influenza B  by PCR NEGATIVE NEGATIVE Final    Comment: (NOTE) The Xpert Xpress SARS-CoV-2/FLU/RSV plus assay is intended as an aid in the diagnosis of influenza from Nasopharyngeal swab specimens and should not be used as a sole basis for treatment. Nasal washings and aspirates are unacceptable for Xpert Xpress SARS-CoV-2/FLU/RSV testing.  Fact Sheet for Patients: BloggerCourse.com  Fact Sheet for Healthcare Providers: SeriousBroker.it  This test is not yet approved or cleared by the [place] FDA and has been authorized for detection and/or diagnosis of SARS-CoV-2 by FDA under an Emergency Use Authorization (EUA). This EUA will remain in effect (meaning this test can be used) for the duration of the COVID-19 declaration under Section 564(b)(1) of the Act, 21 U.S.C. section 360bbb-3(b)(1), unless the authorization is terminated or revoked.     Resp Syncytial Virus by PCR NEGATIVE NEGATIVE Final    Comment: (NOTE) Fact Sheet for Patients: BloggerCourse.com  Fact Sheet for Healthcare Providers: SeriousBroker.it  This test is not yet approved or cleared by the [place] FDA and has been authorized for detection and/or diagnosis of SARS-CoV-2 by FDA under an Emergency Use Authorization (EUA). This EUA will remain in effect (meaning this test  can be used) for the duration of the COVID-19 declaration under Section 564(b)(1) of the Act, 21 U.S.C. section 360bbb-3(b)(1), unless the authorization is terminated or revoked.  Performed at [facility], [address], [place], [place] [address]          Radiology Studies: DG Chest 2 View Result Date: 03/04/2023 CLINICAL DATA:  Chest pain with cough EXAM: CHEST - 2 VIEW COMPARISON:  07/11/2021 FINDINGS: Symmetric interstitial opacity with cephalized blood flow and some Kerley lines/fissure thickening. Hazy airspace disease in the  lower lungs. No pleural effusion or pneumothorax. Stable cardiac enlargement and upper mediastinal convexity. IMPRESSION: CHF pattern. Electronically Signed   By: Tiburcio Pea M.D.   On: 03/04/2023 10:06        Scheduled Meds:  aspirin EC  81 mg Oral Daily   atorvastatin  40 mg Oral Daily   budesonide (PULMICORT) nebulizer solution  0.5 mg Nebulization BID   enoxaparin (LOVENOX) injection  40 mg Subcutaneous Q24H   fluticasone  2 spray Each Nare Daily   folic acid  1 mg Oral Daily   furosemide  40 mg Intravenous BID   ipratropium-albuterol  3 mL Nebulization TID   loratadine  10 mg Oral Daily   multivitamin with minerals  1 tablet Oral Daily   pantoprazole  40 mg Oral Q0600   predniSONE  40 mg Oral Q breakfast   sacubitril-valsartan  1 tablet Oral BID   spironolactone  12.5 mg Oral Daily   thiamine  100 mg Oral Daily   Or   thiamine  100 mg Intravenous Daily   Continuous Infusions:  magnesium sulfate bolus IVPB 4 g (03/05/23 0909)     LOS: 0 days    Time spent: 40 minutes    Ramiro Harvest, MD [facility]   To contact the attending provider between 7A-7P or the covering provider during after hours 7P-7A, please log into the web site [email or web address] and access using universal [place] password for that web site. If you do not have the password, please call the hospital operator.  03/05/2023, 9:51 AM

## 2023-03-05 NOTE — Progress Notes (Signed)
CSW reviewed pts SDOH needs and added Food and housing resources to pts AVS.  Emma Hahn, MSW, LCSWA Transitions of Care [phone]

## 2023-03-05 NOTE — Progress Notes (Signed)
 Heart Failure Navigator Progress Note  Assessed for Heart & Vascular TOC clinic readiness.  Patient does not meet criteria due to Advanced Heart Failure Team patient of Dr. [name]. .   Navigator will sign off at this time.   Rhae Hammock, BSN, Scientist, clinical (histocompatibility and immunogenetics) Only

## 2023-03-05 NOTE — Care Management Obs Status (Signed)
MEDICARE OBSERVATION STATUS NOTIFICATION   Patient Details  Name: Emma Hahn MRN: [id number] Date of Birth: 1950/02/20   Medicare Observation Status Notification Given:  Yes    Oletta Cohn, RN 03/05/2023, 8:34 AM

## 2023-03-05 NOTE — Consult Note (Addendum)
Cardiology Consultation   Patient ID: Shailee Foots MRN: [id number]; DOB: [birth year]  Admit date: 03/04/2023 Date of Consult: 03/05/2023  PCP:  Norm Salt, PA   [facility] Providers Cardiologist:  [name], MD     Patient Profile:   Emma Hahn is a 73 y.o. female with a hx of alcohol abuse, tobacco abuse, severe MR, COPD, chronic HFrEF, nonischemic cardiomyopathy who is being seen 03/05/2023 for the evaluation of CHF at the request of Dr. Janee Morn.  History of Present Illness:   [name] is a 73 year old female with above medical history followed by Dr. [name] and Dr. [name].  Per chart review, patient first establish care with cardiology in 05/2019.  At that time, patient had developed dyspnea and chest pressure.  Underwent echocardiogram on 07/24/2019 that showed EF 30% with regional wall motion abnormalities, normal RV function, moderate MR and mild LAE with increased LA pressure.  Patient was later admitted in 11/2019 with acute on chronic heart failure.  Echocardiogram 12/06/2019 showed EF 25-30%, global hypokinesis, mild LVH, grade 2 DD, normal RV function, moderately elevated PA systolic pressure, moderate-severe mitral valve regurgitation, severe TR.  R/L heart cath from 12/08/19 showed normal coronary arteries, mild-moderate right heart pressure elevation with mild pulmonary HTN. Findings overall compatible with nonischemic cardiomyopathy. TEE on 12/09/19 showed severe MR. She was started on GDMT, though it was limited by low BP.   Patient was seen by Dr. [name] on 01/05/20 for evaluation of mitraclip.   She was later admitted in 06/2021 with CHF exacerbation after she stopped taking all of her cardiac medications. Echocardiogram on 07/11/21 showed EF 30-35% with regional wall motion abnormalities, grade II DD, elevated LA pressure, mildly reduced RV function, moderately elevated PA systolic pressure, moderate-severe MR,  mild-moderate TR. She underwent cardiac MRI on 07/13/21 that showed severe LV dysfunction with EF 22%, moderate RV dysfunction with EF 35%. There was basal septal midwall late gadolinium enhancement, which is a scar pattern seen in nonischemic cardiomyopathies and is associated with a worse prognosis. There was moderate MR. Her GDMT was limited by low BP and AKI.   She was last seen by cardiology on 12/13/21. Echocardiogram showed EF 30-35%, grade I DD, normal RV function, moderate pericardial effusion, mild MR. She remained on entresto, spiro, and lasix. Was not a candidate for ICD with her ongoing alcohol use. She has not followed up with cardiology since.   Patient presented to the ED on 2/17 complaining of productive cough, shortness of breath, weakness, chest pain, and orthopnea since 2/13. Labs in the ED showed BNP elevated to 1700. hsTn 47>53. K 3.6, creatinine 0.83, WBC 8.5, hemoglobin 10.2. COVID, Flu, RSV negative.  CXR showed findings consistent with CHF.   She was started on IV lasix 40 mg, was given 2 doses yesterday. Cardiology asked to consult for CHF exacerbation. On interview, patient reports that she has been having shortness of breath on exertion and orthopnea for the past few days. She denies ankle swelling, but does have trace edema on exam. Denies abdominal distention. She has a cough that produces a clear/white sputum. She has mild pain in her chest, somewhat vague. Denies fever, chills, body aches. She admits that she has missed some doses of her cardiac medications recently.    Past Medical History:  Diagnosis Date   Borderline diabetes    COPD (chronic obstructive pulmonary disease) (HCC)    Habitual alcohol use    Tobacco abuse  Past Surgical History:  Procedure Laterality Date   RIGHT/LEFT HEART CATH AND CORONARY ANGIOGRAPHY N/A 12/08/2019   Procedure: RIGHT/LEFT HEART CATH AND CORONARY ANGIOGRAPHY;  Surgeon: Lennette Bihari, MD;  Location: [facility] INVASIVE CV LAB;   Service: Cardiovascular;  Laterality: N/A;   TEE WITHOUT CARDIOVERSION N/A 12/09/2019   Procedure: TRANSESOPHAGEAL ECHOCARDIOGRAM (TEE);  Surgeon: Chrystie Nose, MD;  Location: [facility] ENDOSCOPY;  Service: Cardiovascular;  Laterality: N/A;   TOTAL ABDOMINAL HYSTERECTOMY       Home Medications:  Prior to Admission medications   Medication Sig Start Date End Date Taking? Authorizing Provider  albuterol (VENTOLIN HFA) 108 (90 Base) MCG/ACT inhaler Inhale 2 puffs into the lungs every 6 (six) hours as needed for wheezing or shortness of breath.   Yes [provider]  aspirin EC 81 MG tablet Take 1 tablet (81 mg total) by mouth daily. Swallow whole. 08/15/21  Yes Milford, Anderson Malta, FNP  atorvastatin (LIPITOR) 40 MG tablet Take 1 tablet (40 mg total) by mouth daily. NEEDS FOLLOW UP APPOINTMENT FOR MORE REFILLS Patient taking differently: Take 40 mg by mouth daily. 08/22/22  Yes Bensimhon, Bevelyn Buckles, MD  furosemide (LASIX) 40 MG tablet TAKE 1 TABLET (40 MG TOTAL) BY MOUTH 2 (TWO) TIMES DAILY. NEEDS FOLLOW UP APPOINTMENT FOR MORE REFILLS Patient taking differently: Take 40 mg by mouth 2 (two) times daily. 12/26/22  Yes Bensimhon, Bevelyn Buckles, MD  nitroGLYCERIN (NITROSTAT) 0.4 MG SL tablet Place 1 tablet (0.4 mg total) under the tongue every 5 (five) minutes as needed for chest pain. 08/15/21  Yes Milford, Anderson Malta, FNP  sacubitril-valsartan (ENTRESTO) 24-26 MG Take 1 tablet by mouth 2 (two) times daily. 12/04/22  Yes Milford, Jessica M, FNP  spironolactone (ALDACTONE) 25 MG tablet TAKE 1/2 TABLET EVERY DAY NEEDS FOLLOW UP APPOINTMENT FOR MORE REFILLS 12/26/22  Yes Bensimhon, Bevelyn Buckles, MD    Inpatient Medications: Scheduled Meds:  aspirin EC  81 mg Oral Daily   atorvastatin  40 mg Oral Daily   budesonide (PULMICORT) nebulizer solution  0.5 mg Nebulization BID   enoxaparin (LOVENOX) injection  40 mg Subcutaneous Q24H   fluticasone  2 spray Each Nare Daily   folic acid  1 mg Oral Daily   furosemide   40 mg Intravenous BID   ipratropium-albuterol  3 mL Nebulization TID   loratadine  10 mg Oral Daily   multivitamin with minerals  1 tablet Oral Daily   pantoprazole  40 mg Oral Q0600   predniSONE  40 mg Oral Q breakfast   sacubitril-valsartan  1 tablet Oral BID   spironolactone  12.5 mg Oral Daily   thiamine  100 mg Oral Daily   Or   thiamine  100 mg Intravenous Daily   Continuous Infusions:  magnesium sulfate bolus IVPB     PRN Meds: acetaminophen **OR** acetaminophen, ipratropium-albuterol, LORazepam **OR** LORazepam, senna-docusate  Allergies:   No Known Allergies  Social History:   Social History   Socioeconomic History   Marital status: Single    Spouse name: Not on file   Number of children: Not on file   Years of education: Not on file   Highest education level: Not on file  Occupational History   Not on file  Tobacco Use   Smoking status: Every Day    Types: Cigarettes   Smokeless tobacco: Never  Substance and Sexual Activity   Alcohol use: Yes    Comment: Usually drinks vodka or gin or beer, 3 days out of the  week   Drug use: Never   Sexual activity: Not on file  Other Topics Concern   Not on file  Social History Narrative   Not on file   Social Drivers of Health   Financial Resource Strain: Medium Risk (07/11/2021)   Overall Financial Resource Strain (CARDIA)    Difficulty of Paying Living Expenses: Somewhat hard  Food Insecurity: No Food Insecurity (07/11/2021)   Hunger Vital Sign    Worried About Running Out of Food in the Last Year: Never true    Ran Out of Food in the Last Year: Never true  Transportation Needs: No Transportation Needs (07/11/2021)   PRAPARE - Administrator, Civil Service (Medical): No    Lack of Transportation (Non-Medical): No  Physical Activity: Not on file  Stress: Not on file  Social Connections: Not on file  Intimate Partner Violence: Not on file    Family History:    Family History  Problem Relation Age  of Onset   Stroke Maternal Grandmother        ? Possible heart disease too - patient unsure     ROS:  Please see the history of present illness.   All other ROS reviewed and negative.     Physical Exam/Data:   Vitals:   03/04/23 2144 03/05/23 0155 03/05/23 0400 03/05/23 0430  BP: 119/78 123/75 (!) 128/94   Pulse: (!) 101 86 (!) 104 77  Resp: 16 15 (!) 25 13  Temp: 97.6 F (36.4 C) (!) 97.5 F (36.4 C)    TempSrc: Oral Axillary    SpO2: 100% 100% 100% 100%  Weight:      Height:       No intake or output data in the 24 hours ending 03/05/23 0837    03/04/2023   10:01 AM 12/13/2021    9:50 AM 09/12/2021    9:14 AM  Last 3 Weights  Weight (lbs) 139 lb 139 lb 6.4 oz 142 lb 9.6 oz  Weight (kg) 63.05 kg 63.231 kg 64.683 kg     Body mass index is 23.86 kg/m.  General:  Frail elderly female. Sitting upright on the side of the bed. No acute distress  HEENT: normal Neck: no JVD Vascular: Radial pulses 2+ bilaterally Cardiac:  normal S1, S2; RRR; no murmur   Lungs:  crackles in bilateral lung bases, normal WOB on room air  Abd: soft, nontender  Ext: Trace edema in BLE  Musculoskeletal:  No deformities  Skin: warm and dry  Neuro:  CNs 2-12 intact, no focal abnormalities noted Psych:  Normal affect   EKG:  The EKG was personally reviewed and demonstrates:  NSR with PVCs, HR 100 BPM  Telemetry:  Telemetry was personally reviewed and demonstrates:  NSR with PACs and PVCs   Relevant CV Studies:  Echocardiogram 03/05/23  1. Left ventricular ejection fraction, by estimation, is 30 to 35%. The  left ventricle has moderately decreased function. The left ventricle  demonstrates global hypokinesis. Left ventricular diastolic function could  not be evaluated.   2. Right ventricular systolic function is normal. The right ventricular  size is normal.   3. Patent left atrial appendage seen on two chamber view. Left atrial  size was severely dilated.   4. A small pericardial effusion  is present.   5. The mitral valve is abnormal. Severe mitral valve regurgitation.   6. The aortic valve is tricuspid. Aortic valve regurgitation is not  visualized. No aortic stenosis is present.  Laboratory Data:  High Sensitivity Troponin:   Recent Labs  Lab 03/04/23 0848 03/04/23 1125  TROPONINIHS 47* 53*     Chemistry Recent Labs  Lab 03/04/23 0848 03/05/23 0516  NA 137 136  K 3.6 4.1  CL 106 103  CO2 21* 22  GLUCOSE 128* 259*  BUN 9 19  CREATININE 0.83 0.99  CALCIUM 9.1 9.0  MG  --  1.6*  GFRNONAA >60 60*  ANIONGAP 10 11    No results for input(s): "PROT", "ALBUMIN", "AST", "ALT", "ALKPHOS", "BILITOT" in the last 168 hours. Lipids No results for input(s): "CHOL", "TRIG", "HDL", "LABVLDL", "LDLCALC", "CHOLHDL" in the last 168 hours.  Hematology Recent Labs  Lab 03/04/23 0848 03/05/23 0516  WBC 8.5 5.5  RBC 3.84* 4.02  HGB 10.2* 10.8*  HCT 33.1* 34.0*  MCV 86.2 84.6  MCH 26.6 26.9  MCHC 30.8 31.8  RDW 15.3 15.1  PLT 331 318   Thyroid  Recent Labs  Lab 03/05/23 0516  TSH 0.784    BNP Recent Labs  Lab 03/04/23 0848  BNP 1,700.2*    DDimer No results for input(s): "DDIMER" in the last 168 hours.   Radiology/Studies:  DG Chest 2 View Result Date: 03/04/2023 CLINICAL DATA:  Chest pain with cough EXAM: CHEST - 2 VIEW COMPARISON:  07/11/2021 FINDINGS: Symmetric interstitial opacity with cephalized blood flow and some Kerley lines/fissure thickening. Hazy airspace disease in the lower lungs. No pleural effusion or pneumothorax. Stable cardiac enlargement and upper mediastinal convexity. IMPRESSION: CHF pattern. Electronically Signed   By: Tiburcio Pea M.D.   On: 03/04/2023 10:06     Assessment and Plan:   Acute on Chronic HFrEF  Nonischemic cardiomyopathy  - Patient has had EF around 25-35% since 2021. Cath in 11/2019 showed normal coronary arteries,. Cardiac MRI in 06/2021 showed basal septal midwall LGE, a scar pattern in nonischemic  cardiomyopathies  - Presented to the ED complaining of DOE, orthopnea. BNP elevated to 1700. CXR with evidence of CHF  - She reports OK compliance with her cardiac medications, but she does often miss her second dose of entresto - Now on IV lasix 40 mg BID- I/Os are not recorded. Renal function stable. She only  has trace lower extremity swelling and fine crackles in bilateral lung bases on exam, but she does not feel much better than when she first arrived. - Continue IV diuresis  - Echocardiogram this admission showed EF 30-35%, normal RV function, severe left atrial enlargement, severe MR  - Continue entresto 24-25 mg BID, spironolactone 12.5 mg daily  - Start metoprolol succinate 25 mg daily when patient is closer to euvolemia   Severe MR - Patient has a known history of mitral valve regurgitation. Was previously severe in 2021 on TEE. Later, echo in 11/2021 showed mild MR  - Echo this admission again shows severe MR. Note, patient was hypervolemic at time of echocardiogram.  - Patient was previously referred to Dr. [name] for consideration of mitral valve repair. However, she has had poor follow up with cardiology and questionable medication compliance. Was also abusing alcohol at the time. If she continues to be compliant with medications and follows up well as an outpatient, could consider repeat evaluation as an outpatient   Elevated Trop  - hsTn 47>53  - Patient does have mild chest discomfort, somewhat vague. Previous cath in 2021 showed no CAD - Suspect trop elevation and chest discomfort are related to volume overload. No plans for ischemic evaluation for now  COPD with mild exacerbation  - Managed by [facility]. Being treated with prednisone, PRN duonebs     Risk Assessment/Risk Scores:      New York Heart Association (NYHA) Functional Class NYHA Class IV    For questions or updates, please contact  [facility] Please consult [email or web address] for contact info under     Signed, Jonita Albee, PA-C  03/05/2023 8:37 AM    Attending Note:   The patient was seen and examined.  Agree with assessment and plan as noted above.  Changes made to the above note as needed.  Patient seen and independently examined with Thurston Hole, PA .   We discussed all aspects of the encounter. I agree with the assessment and plan as stated above.   Acute on chronic congestive heart failure: The patient presents with symptoms consistent with volume overload JVD.  She admits to not taking her medications all the time.  She admits to eating more salt than she should.  Agree with Lasix today for diuresis.  If she gets more euvolemic we we will continue adding additional guideline directed medical therapy for her congestive heart failure  2.  Severe mitral regurgitation.  Echocardiogram from today was again revealed severe mitral regurgitation.  She has been referred to see Dr. [name] in the past for mitral valve repair however she had poor follow-up and was abusing alcohol at the time.  We will consider repeat evaluation as an outpatient.     I have spent a total of 40 minutes with patient reviewing hospital  notes , telemetry, EKGs, labs and examining patient as well as establishing an assessment and plan that was discussed with the patient.  > 50% of time was spent in direct patient care.    Vesta Mixer, Montez Hageman., MD, [facility] 03/05/2023, 1:05 PM [address],  [address] Office [phone] Pager [phone]

## 2023-03-06 DIAGNOSIS — I5023 Acute on chronic systolic (congestive) heart failure: Secondary | ICD-10-CM

## 2023-03-06 DIAGNOSIS — J441 Chronic obstructive pulmonary disease with (acute) exacerbation: Secondary | ICD-10-CM | POA: Diagnosis not present

## 2023-03-06 DIAGNOSIS — Z789 Other specified health status: Secondary | ICD-10-CM | POA: Diagnosis not present

## 2023-03-06 DIAGNOSIS — I340 Nonrheumatic mitral (valve) insufficiency: Secondary | ICD-10-CM | POA: Diagnosis not present

## 2023-03-06 DIAGNOSIS — I5043 Acute on chronic combined systolic (congestive) and diastolic (congestive) heart failure: Secondary | ICD-10-CM | POA: Diagnosis not present

## 2023-03-06 DIAGNOSIS — I10 Essential (primary) hypertension: Secondary | ICD-10-CM | POA: Diagnosis not present

## 2023-03-06 LAB — CBC
HCT: 31.2 % — ABNORMAL LOW (ref 36.0–46.0)
Hemoglobin: 9.9 g/dL — ABNORMAL LOW (ref 12.0–15.0)
MCH: 26.9 pg (ref 26.0–34.0)
MCHC: 31.7 g/dL (ref 30.0–36.0)
MCV: 84.8 fL (ref 80.0–100.0)
Platelets: 273 10*3/uL (ref 150–400)
RBC: 3.68 MIL/uL — ABNORMAL LOW (ref 3.87–5.11)
RDW: 15.2 % (ref 11.5–15.5)
WBC: 11.1 10*3/uL — ABNORMAL HIGH (ref 4.0–10.5)
nRBC: 0 % (ref 0.0–0.2)

## 2023-03-06 LAB — RENAL FUNCTION PANEL
Albumin: 2.8 g/dL — ABNORMAL LOW (ref 3.5–5.0)
Anion gap: 12 (ref 5–15)
BUN: 25 mg/dL — ABNORMAL HIGH (ref 8–23)
CO2: 21 mmol/L — ABNORMAL LOW (ref 22–32)
Calcium: 8.5 mg/dL — ABNORMAL LOW (ref 8.9–10.3)
Chloride: 99 mmol/L (ref 98–111)
Creatinine, Ser: 1.08 mg/dL — ABNORMAL HIGH (ref 0.44–1.00)
GFR, Estimated: 54 mL/min — ABNORMAL LOW (ref >60)
Glucose, Bld: 219 mg/dL — ABNORMAL HIGH (ref 70–99)
Phosphorus: 3 mg/dL (ref 2.5–4.6)
Potassium: 4.6 mmol/L (ref 3.5–5.1)
Sodium: 132 mmol/L — ABNORMAL LOW (ref 135–145)

## 2023-03-06 LAB — LIPID PANEL
Cholesterol: 177 mg/dL (ref 0–200)
HDL: 54 mg/dL (ref >40)
LDL Cholesterol: 109 mg/dL — ABNORMAL HIGH (ref 0–99)
Total CHOL/HDL Ratio: 3.3 {ratio}
Triglycerides: 69 mg/dL (ref <150)
VLDL: 14 mg/dL (ref 0–40)

## 2023-03-06 LAB — MAGNESIUM: Magnesium: 2.1 mg/dL (ref 1.7–2.4)

## 2023-03-06 MED ORDER — MELATONIN 5 MG PO TABS
5.0000 mg | ORAL_TABLET | Freq: Every evening | ORAL | Status: DC | PRN
  Administered 2023-03-06 – 2023-03-07 (×2): 5 mg via ORAL
  Filled 2023-03-06 (×2): qty 1

## 2023-03-06 MED ORDER — FUROSEMIDE 40 MG PO TABS
60.0000 mg | ORAL_TABLET | Freq: Two times a day (BID) | ORAL | Status: DC
  Administered 2023-03-06: 60 mg via ORAL
  Filled 2023-03-06 (×2): qty 1

## 2023-03-06 MED ORDER — METOPROLOL SUCCINATE ER 25 MG PO TB24
25.0000 mg | ORAL_TABLET | Freq: Every day | ORAL | Status: DC
  Administered 2023-03-06 – 2023-03-07 (×2): 25 mg via ORAL
  Filled 2023-03-06 (×3): qty 1

## 2023-03-06 MED ORDER — LORAZEPAM 1 MG PO TABS: 1.0000 mg | ORAL_TABLET | ORAL | Status: DC | PRN

## 2023-03-06 NOTE — Assessment & Plan Note (Addendum)
Improved symptoms.  Continue inhaled corticosteroids and bronchodilator therapy. She had a short course of prednisone during her hospitalization.

## 2023-03-06 NOTE — Assessment & Plan Note (Signed)
 Continue with statin therapy.  ?

## 2023-03-06 NOTE — Assessment & Plan Note (Signed)
No signs of acute alcohol withdrawal.

## 2023-03-06 NOTE — Assessment & Plan Note (Signed)
Hyponatremia. AKI  Renal function with serum cr at 1,31 with K at 4,6 and serum bicarbonate at 23  Na 131 and Mg 1,8   Continue to hold on loop diuretic therapy for now.  Add 2 g Mag sulfate.  Follow up renal function and electrolytes in am.

## 2023-03-06 NOTE — Evaluation (Signed)
Physical Therapy Evaluation & Discharge Patient Details Name: Emma Hahn MRN: [id number] DOB: 08-09-1950 Today's Date: 03/06/2023  History of Present Illness  Pt is a 73 y.o. female presenting 03/04/23 with worsening SOB, a 4 day hx of DOE, orthopnea, some chest discomfort, and productive cough. Admitted with CHF and COPD exacerbation. PMH includes HLD, HTN, COPD, combined diastolic and systolic CHF.   Clinical Impression  Pt presents with condition above. PTA, she was independent, living alone in a 2-level house with 2 STE and x7+7 stairs to access her bedroom and bathroom. She currently is able to perform all functional mobility without an AD, LOB, or assistance, reporting and appearing to be near if not at her baseline. She does demonstrate deficits in endurance though. Educated pt on exercising with CHF, reducing sodium/processed foods intake, and weighing self daily to manage her CHF and provided her with CHF booklet and exercising with CHF handout. Pt a bit resistive to education on starting a walking program, yet reported interest in [employer] program. Will defer further mobility to nursing and mobility specialist team, but no further skilled PT needs identified. All education completed and questions answered. PT will sign off.        If plan is discharge home, recommend the following:  (N/A)   Can travel by private vehicle        Equipment Recommendations None recommended by PT  Recommendations for Other Services       Functional Status Assessment Patient has not had a recent decline in their functional status     Precautions / Restrictions Precautions Precautions: None Restrictions Weight Bearing Restrictions Per Provider Order: No      Mobility  Bed Mobility Overal bed mobility: Modified Independent             General bed mobility comments: HOB elevated, no assistance needed    Transfers Overall transfer level: Independent Equipment used: None                General transfer comment: Able to transfer from EOB, toilet, and chair without assistance or LOB    Ambulation/Gait Ambulation/Gait assistance: Modified independent (Device/Increase time) Gait Distance (Feet): 140 Feet (x3 bouts of ~15 ft > ~140 ft > ~65 ft) Assistive device: None Gait Pattern/deviations: Step-through pattern, Decreased stride length Gait velocity: WFL Gait velocity interpretation: >2.62 ft/sec, indicative of community ambulatory   General Gait Details: Pt ambulates at a good pace initially, but as she fatigues she slows down. No overt LOB noted though.  Stairs Stairs: Yes Stairs assistance: Supervision Stair Management: One rail Right, Alternating pattern, Forwards Number of Stairs: 10 General stair comments: Ascends and descends with R rail and reciprocal pattern, no LOB, supervision for safety  Wheelchair Mobility     Tilt Bed    Modified Rankin (Stroke Patients Only)       Balance Overall balance assessment: No apparent balance deficits (not formally assessed)                                           Pertinent Vitals/Pain Pain Assessment Pain Assessment: Faces Faces Pain Scale: Hurts a little bit Pain Location: R side of abdomen Pain Descriptors / Indicators: Discomfort, Sharp Pain Intervention(s): Limited activity within patient's tolerance, Monitored during session    Home Living Family/patient expects to be discharged to:: Private residence Living Arrangements: Alone Available Help at Discharge:  (none)  Type of Home: House Home Access: Stairs to enter Entrance Stairs-Rails: Left Entrance Stairs-Number of Steps: 2 Alternate Level Stairs-Number of Steps: 7+7 Home Layout: Two level;Bed/bath upstairs Home Equipment: Shower seat      Prior Function Prior Level of Function : Independent/Modified Independent             Mobility Comments: no AD       Extremity/Trunk Assessment   Upper  Extremity Assessment Upper Extremity Assessment: Defer to OT evaluation;LUE deficits/detail LUE Deficits / Details: 5th digit stuck in flexion    Lower Extremity Assessment Lower Extremity Assessment: Overall WFL for tasks assessed    Cervical / Trunk Assessment Cervical / Trunk Assessment: Normal  Communication   Communication Communication: No apparent difficulties    Cognition Arousal: Alert Behavior During Therapy: WFL for tasks assessed/performed   PT - Cognitive impairments: No apparent impairments                         Following commands: Intact       Cueing       General Comments General comments (skin integrity, edema, etc.): Educated pt on exercising with CHF, reducing sodium/processed foods intake, and weighing self daily to manage her CHF and provided her with CHF booklet and exercising with CHF handout; VSS on RA    Exercises     Assessment/Plan    PT Assessment Patient does not need any further PT services  PT Problem List         PT Treatment Interventions      PT Goals (Current goals can be found in the Care Plan section)  Acute Rehab PT Goals Patient Stated Goal: to stay independent PT Goal Formulation: All assessment and education complete, DC therapy Time For Goal Achievement: 03/07/23 Potential to Achieve Goals: Good    Frequency       Co-evaluation               AM-PAC PT "6 Clicks" Mobility  Outcome Measure Help needed turning from your back to your side while in a flat bed without using bedrails?: None Help needed moving from lying on your back to sitting on the side of a flat bed without using bedrails?: None Help needed moving to and from a bed to a chair (including a wheelchair)?: None Help needed standing up from a chair using your arms (e.g., wheelchair or bedside chair)?: None Help needed to walk in hospital room?: None Help needed climbing 3-5 steps with a railing? : A Little 6 Click Score: 23    End of  Session Equipment Utilized During Treatment:  (pt declined gait belt) Activity Tolerance: Patient tolerated treatment well Patient left: in bed;with call bell/phone within reach Nurse Communication: Mobility status PT Visit Diagnosis: Other (comment) (cardiorespiratory deficits/endurance deficits)    Time: 1610-9604 PT Time Calculation (min) (ACUTE ONLY): 23 min   Charges:   PT Evaluation $PT Eval Low Complexity: 1 Low PT Treatments $Therapeutic Activity: 8-22 mins PT General Charges $$ ACUTE PT VISIT: 1 Visit         Virgil Benedict, PT, DPT Acute Rehabilitation Services  Office: [phone]   Bettina Gavia 03/06/2023, 12:26 PM

## 2023-03-06 NOTE — Progress Notes (Addendum)
Patient Name: Emma Hahn Date of Encounter: 03/06/2023 [facility] Cardiologist: [name], MD   Interval Summary  .    73 y.o. female with a hx of alcohol abuse, tobacco abuse, severe mitral regurgitation, COPD, chronic HFrEF, nonischemic cardiomyopathy, who is followed by cardiology for acute on chronic systolic heart failure.    Patient states she feels good and bad on and off, denied SOB, leg edema. She has ambulated well.   She is still dyspneic     Vital Signs .    Vitals:   03/06/23 0436 03/06/23 0807 03/06/23 0817 03/06/23 0922  BP: 113/80 119/79 (!) 111/57   Pulse:  98 98   Resp: 20 18 18    Temp: 97.6 F (36.4 C) 97.8 F (36.6 C) 97.8 F (36.6 C)   TempSrc: Oral Oral Oral   SpO2: 96% 97% 95% 97%  Weight: 57.7 kg     Height:        Intake/Output Summary (Last 24 hours) at 03/06/2023 1001 Last data filed at 03/06/2023 0900 Gross per 24 hour  Intake 598 ml  Output 900 ml  Net -302 ml      03/06/2023    4:36 AM 03/05/2023    2:18 PM 03/04/2023   10:01 AM  Last 3 Weights  Weight (lbs) 127 lb 3.3 oz 126 lb 15.8 oz 139 lb  Weight (kg) 57.7 kg 57.6 kg 63.05 kg      Telemetry/ECG    Sinus rhythm/tachycardia up to 120s, NSVT <5 s 14 runs  - Personally Reviewed  Physical Exam .   GEN: No acute distress.   Neck: No JVD Cardiac: RRR, grade II systolic murmur apex Respiratory: Clear to auscultation bilaterally. On room air GI: Soft, nontender, non-distended  MS: No edema  Assessment & Plan .     Acute on Chronic systolic heart failure   Nonischemic cardiomyopathy  - EF 25-35% since 2021. Cath in 11/2019 showed normal coronary arteries,. Cardiac MRI in 06/2021 showed basal septal midwall LGE, a scar pattern in nonischemic cardiomyopathies  - Presented with worsening DOE, orthopnea. BNP elevated to 1700. CXR with evidence of CHF  - She reports often miss her second dose of entresto - Echocardiogram this admission showed EF  30-35%, normal RV function, severe left atrial enlargement, severe MR  - On PTA Lasix 40mg  BID, now on IV lasix 40 mg BID, UOP yesterday,  weight is 139 >126.99 >127.21 IBS since admission, clinically euvolemic, recommend transition to PO Lasix 60mg  BID now  - GDMT: start metoprolol XL 25mg  daily, continue entresto 24-25 mg BID, spironolactone 12.5 mg daily  - discussed limit overall fluid intake, she drinks 5-6 16-20oz water daily + soda; discussed complete alcohol cessation  - asked coordinator to arrange [facility] impact clinic appt, need to return to AHF team if able; meanwhile arranged gen card follow up on 3/10   Severe Mitral Regurgitation  - Patient has a known history of mitral valve regurgitation. Was previously severe in 2021 on TEE. Later, echo in 11/2021 showed mild MR  - Echo this admission again shows severe MR. Note, patient was hypervolemic at time of echocardiogram.  - Patient was previously referred to Dr. [name] for consideration of mitral valve repair. However, she has had poor follow up with cardiology and questionable medication compliance. Was also abusing alcohol at the time. If she continues to be compliant with medications and follows up well as an outpatient, could consider repeat evaluation as an outpatient  Elevated Trop  - hsTn 47>53  - Patient does have mild chest discomfort, somewhat vague. Previous cath in 2021 showed no CAD - Suspect trop elevation and chest discomfort are related to volume overload. No plans for ischemic evaluation for now   NSVT - noted on telemetry - keep K >4 and Mag >2 - start Toprolol Xl as above   COPD ETOH abuse - per primary team    For questions or updates, please contact [facility] Please consult [email or web address] for contact info under        Signed, Cyndi Bender, NP   Attending Note:   The patient was seen and examined.  Agree with assessment and plan as noted above.  Changes made to the above note as  needed.  Patient seen and independently examined with Cyndi Bender, NP .   We discussed all aspects of the encounter. I agree with the assessment and plan as stated above.    Acute on chronic combined CHF:   due to ETOH abuse, medication noncompliance, dietary noncompliance  Her MR is once again severe .  Possibly due to volume overload   Continue current diuretics. She may be able to go home tomorrow .   2.  MR:   I suspect her MR will be better once she is diuresed.   She has been referred to the valve clinic [name]) but failed to follow up   Dr. [name] may consider re-consultating as OP .  No role for urgent inpatient treatment of her MR .      I have spent a total of 40 minutes with patient reviewing hospital  notes , telemetry, EKGs, labs and examining patient as well as establishing an assessment and plan that was discussed with the patient.  > 50% of time was spent in direct patient care.    Vesta Mixer, Montez Hageman., MD, [facility] 03/06/2023, 11:24 AM [address],  [address] Office [phone] Pager [phone]

## 2023-03-06 NOTE — Evaluation (Signed)
Occupational Therapy Evaluation Patient Details Name: Emma Hahn MRN: [id number] DOB: 06/18/1950 Today's Date: 03/06/2023   History of Present Illness   Pt is a 73 y.o. female presenting 03/04/23 with worsening SOB, a 4 day hx of DOE, orthopnea, some chest discomfort, and productive cough. Admitted with CHF and COPD exacerbation. PMH includes HLD, HTN, COPD, combined diastolic and systolic CHF.     Clinical Impressions Pt is functioning at her baseline of modified independence. She implements energy conservation strategies in ADLs at home and is aware of how to manage her CHF. Admits to drinking more fluids, eating high sodium food and not taking her meds as prescribed. No further OT needs.      If plan is discharge home, recommend the following:    NA     Functional Status Assessment   Patient has had a recent decline in their functional status and demonstrates the ability to make significant improvements in function in a reasonable and predictable amount of time.     Equipment Recommendations   None recommended by OT     Recommendations for Other Services         Precautions/Restrictions   Precautions Precautions: None Restrictions Weight Bearing Restrictions Per Provider Order: No     Mobility Bed Mobility Overal bed mobility: Modified Independent                  Transfers Overall transfer level: Independent Equipment used: None                      Balance Overall balance assessment: No apparent balance deficits (not formally assessed)                                         ADL either performed or assessed with clinical judgement   ADL Overall ADL's : Modified independent                                             Vision Baseline Vision/History: 1 Wears glasses Ability to See in Adequate Light: 0 Adequate Patient Visual Report: No change from baseline       Perception          Praxis         Pertinent Vitals/Pain Pain Assessment Pain Assessment: Faces Faces Pain Scale: Hurts a little bit Pain Location: abdomen with eating Pain Descriptors / Indicators: Discomfort, Sharp Pain Intervention(s): Monitored during session, Repositioned     Extremity/Trunk Assessment Upper Extremity Assessment Upper Extremity Assessment: Overall WFL for tasks assessed LUE Deficits / Details: 5th digit stuck in flexion   Lower Extremity Assessment Lower Extremity Assessment: Defer to PT evaluation   Cervical / Trunk Assessment Cervical / Trunk Assessment: Normal   Communication Communication Communication: No apparent difficulties   Cognition Arousal: Alert Behavior During Therapy: WFL for tasks assessed/performed Cognition: No apparent impairments             OT - Cognition Comments: pt is aware of how to manage her CHF, reports having drank more fluids and eating potato chips                 Following commands: Intact       Cueing  General Comments  Exercises     Shoulder Instructions      Home Living Family/patient expects to be discharged to:: Private residence Living Arrangements: Alone Available Help at Discharge:  (none) Type of Home: Apartment Home Access: Stairs to enter Entrance Stairs-Number of Steps: 2 Entrance Stairs-Rails: Left Home Layout: Two level;Bed/bath upstairs Alternate Level Stairs-Number of Steps: 7+7 Alternate Level Stairs-Rails: Left Bathroom Shower/Tub: Chief Strategy Officer: Standard     Home Equipment: Shower seat          Prior Functioning/Environment Prior Level of Function : Independent/Modified Independent             Mobility Comments: no AD ADLs Comments: reports having to divide housework over several days to complete    OT Problem List:     OT Treatment/Interventions:        OT Goals(Current goals can be found in the care plan section)       OT Frequency:        Co-evaluation              AM-PAC OT "6 Clicks" Daily Activity     Outcome Measure Help from another person eating meals?: None Help from another person taking care of personal grooming?: None Help from another person toileting, which includes using toliet, bedpan, or urinal?: None Help from another person bathing (including washing, rinsing, drying)?: None Help from another person to put on and taking off regular upper body clothing?: None Help from another person to put on and taking off regular lower body clothing?: None 6 Click Score: 24   End of Session    Activity Tolerance: Patient tolerated treatment well Patient left: in bed;with call bell/phone within reach  OT Visit Diagnosis: Other (comment) (decreased activity tolerance)                Time: 1220-1240 OT Time Calculation (min): 20 min Charges:  OT General Charges $OT Visit: 1 Visit OT Evaluation $OT Eval Low Complexity: 1 Low Berna Spare, OTR/L Acute Rehabilitation Services Office: [phone]   Evern Bio 03/06/2023, 1:00 PM

## 2023-03-06 NOTE — Hospital Course (Signed)
Patient 73 year old female, history of chronic HFrEF, nonischemic cardiomyopathy, severe MR, history of pericardial effusion, COPD with ongoing tobacco use, hypertension, hyperlipidemia presented to the ED with cough productive of clear sputum, progressive dyspnea on exertion, orthopnea, wheezing. Patient on admission noted to have elevated BNP, chest x-ray consistent with volume overload, patient admitted for acute CHF exacerbation and COPD exacerbation. Due to complex cardiac history cardiology consulted.

## 2023-03-06 NOTE — Progress Notes (Signed)
  Progress Note   Patient: Emma Hahn MVH:[id number] DOB: 1950-12-28 DOA: 03/04/2023     1 DOS: the patient was seen and examined on 03/06/2023   Brief hospital course: Patient 73 year old female, history of chronic HFrEF, nonischemic cardiomyopathy, severe MR, history of pericardial effusion, COPD with ongoing tobacco use, hypertension, hyperlipidemia presented to the ED with cough productive of clear sputum, progressive dyspnea on exertion, orthopnea, wheezing. Patient on admission noted to have elevated BNP, chest x-ray consistent with volume overload, patient admitted for acute CHF exacerbation and COPD exacerbation. Due to complex cardiac history cardiology consulted.   Assessment and Plan: * Acute on chronic systolic CHF (congestive heart failure) (HCC) Echocardiogram with reduced LV systolic function with EF 30 to 35%, global hypokinesis, RV systolic function preserved, LA with severe dilatation, small pericardial effusion, severe mitral regurgitation, mild TR   Urine output is 900 cc  Systolic blood pressure 112 mmHg.   Plan to continue diuresis with furosemide 60 mg po bid.  Heart failure guideline medical therapy with spironolactone, entresto, and metoprolol   COPD with acute exacerbation (HCC) Improved symptoms.  Continue inhaled corticosteroids and bronchodilator therapy. Discontinue prednisone.    HTN (hypertension) Continue blood pressure control with Entresto and metoprolol.  Diuresis with spironolactone and furosemide.  Alcohol use No signs of acute alcohol withdrawal Will discontinue CIWA protocol and will add as needed lorazepam for anxiety   Hyperlipidemia Continue with statin therapy   Hypomagnesemia Hyponatremia.   Renal function with serum cr at 0,8 with K at 4,6 and serum bicarbonate at 21  Na 132 and Mg 2,1   Continue electrolyte correction.  Continue furosemide and spironolactone.       Subjective: Patient is feeling better, with  improvement in dyspnea and edema, no PND or orthopnea. Today is feeling weak and deconditioned, not at her baseline   Physical Exam: Vitals:   03/06/23 0807 03/06/23 0817 03/06/23 0922 03/06/23 1133  BP: 119/79 (!) 111/57  101/76  Pulse: 98 98  95  Resp: 18 18  18   Temp: 97.8 F (36.6 C) 97.8 F (36.6 C)  (!) 97.4 F (36.3 C)  TempSrc: Oral Oral  Oral  SpO2: 97% 95% 97% 94%  Weight:      Height:       Neurology awake and alert ENT with mild pallor Cardiovascular with S1 and S2 present and regular with no gallops, rubs or murmurs No JVD No lower extremity edema Respiratory with no rales or wheezing, no rhonchi, poor inspiratory effort Abdomen with no distention No lower extremity edema  Data Reviewed:    Family Communication: no family at the bedside   Disposition: Status is: Inpatient Remains inpatient appropriate because: recovering heart failure, possible discharge home tomorrow   Planned Discharge Destination: Home      Author: Coralie Keens, MD 03/06/2023 4:22 PM  For on call review [email or web address].

## 2023-03-06 NOTE — Assessment & Plan Note (Signed)
Continue blood pressure control with losartan and metoprolol.  Diuresis with spironolactone, and loop diuretic with furosemide.

## 2023-03-06 NOTE — Assessment & Plan Note (Signed)
Echocardiogram with reduced LV systolic function with EF 30 to 35%, global hypokinesis, RV systolic function preserved, LA with severe dilatation, small pericardial effusion, severe mitral regurgitation, mild TR   Urine output is 900 cc  Systolic blood pressure 112 mmHg.   Plan to continue diuresis with furosemide 60 mg po bid.  Heart failure guideline medical therapy with spironolactone, entresto, and metoprolol

## 2023-03-07 DIAGNOSIS — I5023 Acute on chronic systolic (congestive) heart failure: Secondary | ICD-10-CM | POA: Diagnosis not present

## 2023-03-07 DIAGNOSIS — I10 Essential (primary) hypertension: Secondary | ICD-10-CM | POA: Diagnosis not present

## 2023-03-07 DIAGNOSIS — J441 Chronic obstructive pulmonary disease with (acute) exacerbation: Secondary | ICD-10-CM | POA: Diagnosis not present

## 2023-03-07 DIAGNOSIS — Z789 Other specified health status: Secondary | ICD-10-CM | POA: Diagnosis not present

## 2023-03-07 LAB — BASIC METABOLIC PANEL
Anion gap: 9 (ref 5–15)
BUN: 29 mg/dL — ABNORMAL HIGH (ref 8–23)
CO2: 23 mmol/L (ref 22–32)
Calcium: 8.7 mg/dL — ABNORMAL LOW (ref 8.9–10.3)
Chloride: 99 mmol/L (ref 98–111)
Creatinine, Ser: 1.31 mg/dL — ABNORMAL HIGH (ref 0.44–1.00)
GFR, Estimated: 43 mL/min — ABNORMAL LOW (ref >60)
Glucose, Bld: 254 mg/dL — ABNORMAL HIGH (ref 70–99)
Potassium: 4.6 mmol/L (ref 3.5–5.1)
Sodium: 131 mmol/L — ABNORMAL LOW (ref 135–145)

## 2023-03-07 LAB — MAGNESIUM: Magnesium: 1.8 mg/dL (ref 1.7–2.4)

## 2023-03-07 MED ORDER — MAGNESIUM SULFATE 2 GM/50ML IV SOLN
2.0000 g | Freq: Once | INTRAVENOUS | Status: AC
  Administered 2023-03-07: 2 g via INTRAVENOUS
  Filled 2023-03-07: qty 50

## 2023-03-07 NOTE — Progress Notes (Addendum)
Patient Name: Emma Hahn Date of Encounter: 03/07/2023 [facility] Cardiologist: [name], MD / [place]  Interval Summary  .    73 y.o. female with a hx of alcohol abuse, tobacco abuse, severe mitral regurgitation, COPD, chronic HFrEF, nonischemic cardiomyopathy, who is followed by cardiology for acute on chronic systolic heart failure.   Patient states she feels ok. She wants to go home.   Vital Signs .    Vitals:   03/07/23 0024 03/07/23 0444 03/07/23 0724 03/07/23 0936  BP: 103/67 100/69 (!) 95/58 101/64  Pulse: 92 78    Resp: 18 18 19    Temp: 97.6 F (36.4 C) (!) 97.3 F (36.3 C) (!) 97.5 F (36.4 C)   TempSrc: Oral Oral Oral   SpO2: 99% 100%    Weight:  57.6 kg    Height:        Intake/Output Summary (Last 24 hours) at 03/07/2023 0941 Last data filed at 03/07/2023 0900 Gross per 24 hour  Intake 655 ml  Output 1200 ml  Net -545 ml      03/07/2023    4:44 AM 03/06/2023    4:36 AM 03/05/2023    2:18 PM  Last 3 Weights  Weight (lbs) 127 lb 127 lb 3.3 oz 126 lb 15.8 oz  Weight (kg) 57.607 kg 57.7 kg 57.6 kg      Telemetry/ECG    Sinus rhythm/tachycardia up to 120s, NSVT- Personally Reviewed  Physical Exam .   GEN: No acute distress.   Neck: No JVD Cardiac: RRR, grade II systolic murmur apex Respiratory: Clear to auscultation bilaterally. On room air GI: Soft, nontender, non-distended  MS: No edema  Assessment & Plan .     Acute on Chronic systolic heart failure   Nonischemic cardiomyopathy  - EF 25-35% since 2021. Cath in 11/2019 showed normal coronary arteries, cardiac MRI in 06/2021 showed basal septal midwall LGE, a scar pattern in nonischemic cardiomyopathies  - Presented with worsening DOE, orthopnea. BNP elevated to 1700. CXR with evidence of CHF  - She reports often miss her second dose of entresto, still drinking ETOH  - Echocardiogram this admission showed EF 30-35%, normal RV function, severe left atrial  enlargement, severe MR  - On PTA Lasix 40mg  BID, now on IV lasix 40 mg BID, UOP 1200 ml yesterday,  weight is 139 >127 IBS since admission, clinically euvolemic, transitioned to PO Lasix 60mg  BID yesterday, renal index worsened today, Cr baseline 0.8-0.9 now up to 1.31 today, will hold Lasix for 24 hours today, may resume tomorrow  - GDMT: started metoprolol XL 25mg  daily, continue entresto 24-25 mg BID, spironolactone 12.5 mg daily  - discussed limit overall fluid intake, she drinks 5-6 16-20oz water daily + soda; discussed complete alcohol cessation  - arranged appt returning to AHF team on 03/22/23  Severe Mitral Regurgitation  - Patient has a known history of mitral valve regurgitation. Was previously severe in 2021 on TEE. Later, echo in 11/2021 showed mild MR  - Echo this admission again shows severe MR. Note, patient was hypervolemic at time of echocardiogram.  - Patient was previously referred to Dr. [name] for consideration of mitral valve repair. However, she has had poor follow up with cardiology and questionable medication compliance. Was also abusing alcohol at the time. If she continues to be compliant with medications and follows up well as an outpatient, could consider repeat evaluation as an outpatient    Elevated Trop  - hsTn 47>53  -  Patient does have mild chest discomfort, somewhat vague. Previous cath in 2021 showed no CAD - Suspect trop elevation and chest discomfort are related to volume overload. No plans for ischemic evaluation for now   NSVT - noted on telemetry - keep K >4 and Mag >2 - start Toprolol Xl as above   COPD ETOH abuse - per primary team    For questions or updates, please contact [facility] Please consult [email or web address] for contact info under        Signed, Cyndi Bender, NP    Attending Note:   The patient was seen and examined.  Agree with assessment and plan as noted above.  Changes made to the above note as needed.  Patient seen  and independently examined with Cyndi Bender, NP .   We discussed all aspects of the encounter. I agree with the assessment and plan as stated above.    CHF : She has a history of medication noncompliance.  She has chronic alcohol abuse.  She appears to be fairly euvolemic in fact may be over diuresed at this point. Will restart her home diuretics tomorrow.  I think she should be able to go home tomorrow.  She will follow-up with Dr. [name] in the clinic She needs to take her meds as prescribed.  Avoid ETOH.  2.  MR :   medical therapy .  She has been referred to Dr. [name] in the past and was thought to not be a suitable candidate in the past.       I have spent a total of 40 minutes with patient reviewing hospital  notes , telemetry, EKGs, labs and examining patient as well as establishing an assessment and plan that was discussed with the patient.  > 50% of time was spent in direct patient care.    Vesta Mixer, Montez Hageman., MD, [facility] 03/07/2023, 9:56 AM [address]  [address] Office [phone] Pager [phone]

## 2023-03-07 NOTE — Progress Notes (Signed)
  Progress Note   Patient: Emma Hahn ZOX:[id number] DOB: 25-Apr-1950 DOA: 03/04/2023     2 DOS: the patient was seen and examined on 03/07/2023   Brief hospital course: Emma Hahn was admitted to the hospital with the working diagnosis of acute on chronic heat failure decompensation.   73 year old female, history of heart failure, severe MR, history of pericardial effusion, COPD, ongoing tobacco use, hypertension and hyperlipidemia presented to the ED with cough productive of clear sputum, progressive dyspnea on exertion, orthopnea, and wheezing.   Patient on admission noted to have elevated BNP, chest x-ray consistent with volume overload, patient admitted for acute CHF exacerbation and COPD exacerbation. Due to complex cardiac history cardiology consulted.   Assessment and Plan: * Acute on chronic systolic CHF (congestive heart failure) (HCC) Echocardiogram with reduced LV systolic function with EF 30 to 35%, global hypokinesis, RV systolic function preserved, LA with severe dilatation, small pericardial effusion, severe mitral regurgitation, mild TR   Urine output is 1,200 cc  Systolic blood pressure 120 mmHg range.    Heart failure guideline medical therapy with spironolactone, entresto, and metoprolol  Holding on loop diuretic until renal function more stable.   COPD with acute exacerbation (HCC) Improved symptoms.  Continue inhaled corticosteroids and bronchodilator therapy. Discontinue prednisone.    HTN (hypertension) Continue blood pressure control with Entresto and metoprolol.  Diuresis with spironolactone, hold on loop diuretic for now.  Alcohol use No signs of acute alcohol withdrawal Continue with needed lorazepam for anxiety   Hyperlipidemia Continue with statin therapy   Hypomagnesemia Hyponatremia. AKI  Renal function with serum cr at 1,31 with K at 4,6 and serum bicarbonate at 23  Na 131 and Mg 1,8   Continue to hold on loop diuretic therapy for now.   Add 2 g Mag sulfate.  Follow up renal function and electrolytes in am.        Subjective: patient is feeling better, today with no PND or orthopnea, her dyspnea has improved, no chest pain.   Physical Exam: Vitals:   03/07/23 0444 03/07/23 0724 03/07/23 0936 03/07/23 1108  BP: 100/69 (!) 95/58 101/64 92/66  Pulse: 78   93  Resp: 18 19  18   Temp: (!) 97.3 F (36.3 C) (!) 97.5 F (36.4 C)  (!) 97.4 F (36.3 C)  TempSrc: Oral Oral  Oral  SpO2: 100%   100%  Weight: 57.6 kg     Height:       Neurology awake and alert ENT with mild pallor Cardiovascular with S1 and S2 present and regular with no gallops, rubs or murmurs No JVD  Respiratory with no rales or wheezing, no rhonchi  Abdomen with no distention  No lower extremity edema  Data Reviewed:    Family Communication: no family at the bedside   Disposition: Status is: Inpatient Remains inpatient appropriate because: renal function monitoring   Planned Discharge Destination: Home    Author: Coralie Keens, MD 03/07/2023 3:15 PM  For on call review [email or web address].

## 2023-03-07 NOTE — Plan of Care (Signed)
°  Problem: Education: °Goal: Knowledge of General Education information will improve °Description: Including pain rating scale, medication(s)/side effects and non-pharmacologic comfort measures °Outcome: Progressing °  °Problem: Clinical Measurements: °Goal: Cardiovascular complication will be avoided °Outcome: Progressing °  °Problem: Activity: °Goal: Risk for activity intolerance will decrease °Outcome: Progressing °  °

## 2023-03-07 NOTE — Plan of Care (Signed)

## 2023-03-07 NOTE — TOC Progression Note (Signed)
Transition of Care [facility]) - Progression Note    Patient Details  Name: Emma Hahn MRN: [id number] Date of Birth: 1950-02-01  [facility] CM/SW Contact  Leone Haven, RN Phone Number: 03/07/2023, 3:45 PM  Clinical Narrative:    Conts on entresto which was on pta.  Per pt eval rec HHPT,  NCM spoke with patient, offered choice, she states she will do the HHPT until she can get the Silver Sneakers set up.  NCM made referral to [facility] with [facility] for HHPT.        Expected Discharge Plan and Services                                               Social Determinants of Health (SDOH) Interventions SDOH Screenings   Food Insecurity: Food Insecurity Present (03/05/2023)  Housing: High Risk (03/05/2023)  Transportation Needs: No Transportation Needs (03/05/2023)  Utilities: Not At Risk (03/05/2023)  Financial Resource Strain: Medium Risk (07/11/2021)  Social Connections: Socially Isolated (03/05/2023)  Tobacco Use: High Risk (03/04/2023)    Readmission Risk Interventions    07/14/2021    2:13 PM  Readmission Risk Prevention Plan  Post Dischage Appt Complete  Medication Screening Complete  Transportation Screening Complete

## 2023-03-08 ENCOUNTER — Other Ambulatory Visit (HOSPITAL_COMMUNITY): Payer: Self-pay

## 2023-03-08 DIAGNOSIS — I10 Essential (primary) hypertension: Secondary | ICD-10-CM | POA: Diagnosis not present

## 2023-03-08 DIAGNOSIS — I5023 Acute on chronic systolic (congestive) heart failure: Secondary | ICD-10-CM | POA: Diagnosis not present

## 2023-03-08 DIAGNOSIS — J441 Chronic obstructive pulmonary disease with (acute) exacerbation: Secondary | ICD-10-CM | POA: Diagnosis not present

## 2023-03-08 LAB — RENAL FUNCTION PANEL
Albumin: 2.6 g/dL — ABNORMAL LOW (ref 3.5–5.0)
Anion gap: 10 (ref 5–15)
BUN: 24 mg/dL — ABNORMAL HIGH (ref 8–23)
CO2: 24 mmol/L (ref 22–32)
Calcium: 8.6 mg/dL — ABNORMAL LOW (ref 8.9–10.3)
Chloride: 100 mmol/L (ref 98–111)
Creatinine, Ser: 1.23 mg/dL — ABNORMAL HIGH (ref 0.44–1.00)
GFR, Estimated: 46 mL/min — ABNORMAL LOW (ref >60)
Glucose, Bld: 225 mg/dL — ABNORMAL HIGH (ref 70–99)
Phosphorus: 4.2 mg/dL (ref 2.5–4.6)
Potassium: 4.1 mmol/L (ref 3.5–5.1)
Sodium: 134 mmol/L — ABNORMAL LOW (ref 135–145)

## 2023-03-08 LAB — MAGNESIUM: Magnesium: 2.2 mg/dL (ref 1.7–2.4)

## 2023-03-08 MED ORDER — LOSARTAN POTASSIUM 25 MG PO TABS: 25.0000 mg | ORAL_TABLET | Freq: Every day | ORAL | Status: DC

## 2023-03-08 MED ORDER — FUROSEMIDE 40 MG PO TABS
40.0000 mg | ORAL_TABLET | Freq: Two times a day (BID) | ORAL | 0 refills | Status: DC
  Filled 2023-03-08 (×2): qty 60, 30d supply, fill #0

## 2023-03-08 MED ORDER — LOSARTAN POTASSIUM 25 MG PO TABS: 12.5000 mg | ORAL_TABLET | Freq: Every day | ORAL | Status: DC

## 2023-03-08 MED ORDER — SPIRONOLACTONE 25 MG PO TABS
12.5000 mg | ORAL_TABLET | Freq: Every day | ORAL | 0 refills | Status: DC
  Filled 2023-03-08 (×2): qty 30, 60d supply, fill #0

## 2023-03-08 MED ORDER — METOPROLOL SUCCINATE ER 25 MG PO TB24
25.0000 mg | ORAL_TABLET | Freq: Every day | ORAL | 0 refills | Status: DC
  Filled 2023-03-08 (×2): qty 30, 30d supply, fill #0

## 2023-03-08 MED ORDER — SACUBITRIL-VALSARTAN 24-26 MG PO TABS
1.0000 | ORAL_TABLET | Freq: Two times a day (BID) | ORAL | 0 refills | Status: DC
  Filled 2023-03-08: qty 60, 30d supply, fill #0

## 2023-03-08 MED ORDER — LOSARTAN POTASSIUM 25 MG PO TABS
12.5000 mg | ORAL_TABLET | Freq: Every day | ORAL | 0 refills | Status: DC
  Filled 2023-03-08: qty 15, 30d supply, fill #0

## 2023-03-08 MED ORDER — FUROSEMIDE 40 MG PO TABS: 40.0000 mg | ORAL_TABLET | Freq: Two times a day (BID) | ORAL | Status: DC

## 2023-03-08 NOTE — Care Management Important Message (Signed)
Important Message  Patient Details  Name: Emma Hahn MRN: [id number] Date of Birth: 07-03-1950   Important Message Given:  Yes - Medicare IM Patient left prior to IM delivery will mail a copy to the patient home address.    Luisenrique Conran 03/08/2023, 3:16 PM

## 2023-03-08 NOTE — Plan of Care (Signed)

## 2023-03-08 NOTE — Discharge Summary (Addendum)
Physician Discharge Summary   Patient: Emma Hahn MRN: [id number] DOB: [birth year]  Admit date:     03/04/2023  Discharge date: 03/08/23  Discharge Physician: York Ram Donya Hitch   PCP: Norm Salt, [place]   Recommendations at discharge:    Patient has been placed back on her heart failure guideline directed medical therapy, and she has been recommended to stay compliant to avoid decompensations and re hospitalizations.  She has no insurance for entresto, changes to low dose losartan, and follow up as outpatient with heart failure team.  Holding on SGLT 2 inh due until renal function and blood pressure more stable.  Follow up renal function and electrolytes in 7 days as outpatient Follow up with Norva Riffle PA in 7 10 days. Follow up with Cardiology as outpatient.   Discharge Diagnoses: Principal Problem:   Acute on chronic systolic CHF (congestive heart failure) (HCC) Active Problems:   COPD with acute exacerbation (HCC)   HTN (hypertension)   Alcohol use   Hyperlipidemia   Hypomagnesemia  Resolved Problems:   * No resolved hospital problems. [facility] Course: Emma Hahn was admitted to the hospital with the working diagnosis of acute on chronic heat failure decompensation.   73 year old female, history of heart failure, severe MR, history of pericardial effusion, COPD, ongoing tobacco use, hypertension and hyperlipidemia presented to the ED with cough, progressive dyspnea on exertion, orthopnea, and wheezing. She endorsed not being adherent to her medications at home. On her initial physical examination her blood pressure was 145/86, HR 107, RR 22 and 02 saturation 95% on room air, Lungs with rales and wheezing, increased work of breathing, heart with S1 and S2 present and regular with no gallops, or rubs, positive systolic murmur at the apex, abdomen with no distention and no lower extremity edema.   Na 137, K 3,6 Cl 106 bicarbonate 21, glucose 128, bun 9 cr  0,83  BNP 1,700  High sensitive troponin 47 and 53  Wbc 8,5 hgb 10.2 plt 331  Sars covid 19 negative  Influenza negative   Chest radiograph well inflated with cardiomegaly, bilateral hilar vascular congestion, with bilateral cephalization of the vasculature, bilateral central interstitial infiltrates, small right pleural effusion.   EKG 100 bpm, normal axis, normal intervals, qtc 487, sinus rhythm with right atrial enlargement, positive PAC and PVC, with no significant ST segment or  T wave changes.   Patient was placed on IV furosemide for diuresis, electrolytes were corrected.   02/21 volume status has improved and renal function more stable.  Plan to continue medical therapy as outpatient and have close follow up.    Assessment and Plan: * Acute on chronic systolic CHF (congestive heart failure) (HCC) Echocardiogram with reduced LV systolic function with EF 30 to 35%, global hypokinesis, RV systolic function preserved, LA with severe dilatation, small pericardial effusion, severe mitral regurgitation, mild TR   Patient was placed on IV furosemide for diuresis, negative fluid balance was achieved, with significant improvement in her symptoms.    Heart failure guideline medical therapy with spironolactone, losartan, and metoprolol  Diuretic therapy with furosemide 40 mg po bid.  Needs close follow up as outpatient, she has been advices to be adherent to her medications, to avoid decompensation and re hospitalizations.   COPD with acute exacerbation (HCC) Improved symptoms.  Continue inhaled corticosteroids and bronchodilator therapy. She had a short course of prednisone during her hospitalization.   HTN (hypertension) Continue blood pressure control with losartan and metoprolol.  Diuresis with  spironolactone, and loop diuretic with furosemide.   Hypomagnesemia Hyponatremia. AKI  At the time of her discharge her renal function has a serum cr of 1,23 with K at 4,1 and serum  bicarbonate at 24  Na 134  Mg 2.2   Continue diuresis with furosemide. Follow up renal function as outpatient in 7 to 10 days.    Hyperlipidemia Continue with statin therapy   Alcohol use No signs of acute alcohol withdrawal.        Consultants: cardiology  Procedures performed: none   Disposition: Home Diet recommendation:  Cardiac diet DISCHARGE MEDICATION: Allergies as of 03/08/2023   No Known Allergies      Medication List     STOP taking these medications    Entresto 24-26 MG Generic drug: sacubitril-valsartan       TAKE these medications    albuterol 108 (90 Base) MCG/ACT inhaler Commonly known as: VENTOLIN HFA Inhale 2 puffs into the lungs every 6 (six) hours as needed for wheezing or shortness of breath.   aspirin EC 81 MG tablet Take 1 tablet (81 mg total) by mouth daily. Swallow whole.   atorvastatin 40 MG tablet Commonly known as: LIPITOR Take 1 tablet (40 mg total) by mouth daily. NEEDS FOLLOW UP APPOINTMENT FOR MORE REFILLS What changed: additional instructions   furosemide 40 MG tablet Commonly known as: LASIX Take 1 tablet (40 mg total) by mouth 2 (two) times daily. What changed:  when to take this additional instructions   losartan 25 MG tablet Commonly known as: COZAAR Take 0.5 tablets (12.5 mg total) by mouth daily.   metoprolol succinate 25 MG 24 hr tablet Commonly known as: TOPROL-XL Take 1 tablet (25 mg total) by mouth daily. Start taking on: March 09, 2023   nitroGLYCERIN 0.4 MG SL tablet Commonly known as: NITROSTAT Place 1 tablet (0.4 mg total) under the tongue every 5 (five) minutes as needed for chest pain.   spironolactone 25 MG tablet Commonly known as: ALDACTONE Take 0.5 tablets (12.5 mg total) by mouth daily. Start taking on: March 09, 2023 What changed: See the new instructions.        Follow-up Information     [facility] Follow up.   Why: Agency will contact you to set up apt  times Contact information: [address] [phone]               Discharge Exam: Filed Weights   03/06/23 0436 03/07/23 0444 03/08/23 0445  Weight: 57.7 kg 57.6 kg 58.4 kg   BP 97/61 (BP Location: Right Arm)   Pulse 81   Temp (!) 97.5 F (36.4 C) (Oral)   Resp 19   Ht 5\' 4"  (1.626 m)   Wt 58.4 kg   SpO2 100%   BMI 22.09 kg/m   Neurology awake and alert ENT with mild pallor with no icterus Cardiovascular with S1 and S2 present and regular with no gallops, rubs or murmurs No JVD No lower extremity edema Respiratory with no rales or wheezing, no rhonchi Abdomen with no distention    Condition at discharge: stable  The results of significant diagnostics from this hospitalization (including imaging, microbiology, ancillary and laboratory) are listed below for reference.   Imaging Studies: ECHOCARDIOGRAM COMPLETE Result Date: 03/05/2023    ECHOCARDIOGRAM REPORT   Patient Name:   Emma Hahn Date of Exam: 03/05/2023 Medical Rec #:  [id number]           Height:  64.0 in Accession #:    [id number]          Weight:       139.0 lb Date of Birth:  07-15-1950           BSA:          1.676 m Patient Age:    73 years            BP:           128/94 mmHg Patient Gender: F                   HR:           77 bpm. Exam Location:  Inpatient Procedure: 2D Echo (Both Spectral and Color Flow Doppler were utilized during            procedure). Indications:    dyspnea  History:        Patient has prior history of Echocardiogram examinations, most                 recent 07/11/2021. CHF; COPD.  Sonographer:    Trellis Moment RDCS (AE, PE) Referring Phys: [id number] Flossie Buffy AMPONSAH IMPRESSIONS  1. Left ventricular ejection fraction, by estimation, is 30 to 35%. The left ventricle has moderately decreased function. The left ventricle demonstrates global hypokinesis. Left ventricular diastolic function could not be evaluated.  2. Right ventricular systolic function is normal. The right  ventricular size is normal.  3. Patent left atrial appendage seen on two chamber view. Left atrial size was severely dilated.  4. A small pericardial effusion is present.  5. The mitral valve is abnormal. Severe mitral valve regurgitation.  6. The aortic valve is tricuspid. Aortic valve regurgitation is not visualized. No aortic stenosis is present. Comparison(s): Prior images reviewed side by side. Mitral valve regurgitation is once again severe. FINDINGS  Left Ventricle: Left ventricular ejection fraction, by estimation, is 30 to 35%. The left ventricle has moderately decreased function. The left ventricle demonstrates global hypokinesis. Strain imaging was not performed. The left ventricular internal cavity size was normal in size. There is no left ventricular hypertrophy. Left ventricular diastolic function could not be evaluated due to mitral regurgitation (moderate or greater). Left ventricular diastolic function could not be evaluated. Right Ventricle: The right ventricular size is normal. No increase in right ventricular wall thickness. Right ventricular systolic function is normal. Left Atrium: Patent left atrial appendage seen on two chamber view. Left atrial size was severely dilated. Right Atrium: Right atrial size was normal in size. Pericardium: A small pericardial effusion is present. Mitral Valve: The mitral valve is abnormal. Severe mitral valve regurgitation. MV peak gradient, 106.9 mmHg. The mean mitral valve gradient is 72.0 mmHg. Tricuspid Valve: The tricuspid valve is normal in structure. Tricuspid valve regurgitation is mild . No evidence of tricuspid stenosis. Aortic Valve: The aortic valve is tricuspid. Aortic valve regurgitation is not visualized. No aortic stenosis is present. Pulmonic Valve: The pulmonic valve was normal in structure. Pulmonic valve regurgitation is mild. No evidence of pulmonic stenosis. Aorta: The aortic root is normal in size and structure. IAS/Shunts: The interatrial  septum was not well visualized. Additional Comments: 3D imaging was not performed.  LEFT VENTRICLE PLAX 2D LVIDd:         5.30 cm   Diastology LVIDs:         4.50 cm   LV e' medial: 5.33 cm/s LV PW:  1.00 cm LV IVS:        0.80 cm LVOT diam:     1.90 cm LV SV:         39 LV SV Index:   23 LVOT Area:     2.84 cm  RIGHT VENTRICLE RV S prime:     9.46 cm/s TAPSE (M-mode): 1.5 cm LEFT ATRIUM              Index        RIGHT ATRIUM           Index LA diam:        4.50 cm  2.68 cm/m   RA Area:     18.80 cm LA Vol (A2C):   164.0 ml 97.85 ml/m  RA Volume:   53.80 ml  32.10 ml/m LA Vol (A4C):   85.0 ml  50.71 ml/m LA Biplane Vol: 121.0 ml 72.19 ml/m  AORTIC VALVE LVOT Vmax:   95.10 cm/s LVOT Vmean:  62.800 cm/s LVOT VTI:    0.138 m  AORTA Ao Root diam: 2.70 cm MITRAL VALVE              TRICUSPID VALVE MV Area VTI:  0.29 cm    TR Peak grad:   28.9 mmHg MV Peak grad: 106.9 mmHg  TR Vmax:        269.00 cm/s MV Mean grad: 72.0 mmHg MV Vmax:      5.17 m/s    SHUNTS MV Vmean:     400.0 cm/s  Systemic VTI:  0.14 m MR PISA:        6.28 cm  Systemic Diam: 1.90 cm MR PISA Radius: 1.00 cm Riley Lam MD Electronically signed by Riley Lam MD Signature Date/Time: 03/05/2023/10:53:08 AM    Final    DG Chest 2 View Result Date: 03/04/2023 CLINICAL DATA:  Chest pain with cough EXAM: CHEST - 2 VIEW COMPARISON:  07/11/2021 FINDINGS: Symmetric interstitial opacity with cephalized blood flow and some Kerley lines/fissure thickening. Hazy airspace disease in the lower lungs. No pleural effusion or pneumothorax. Stable cardiac enlargement and upper mediastinal convexity. IMPRESSION: CHF pattern. Electronically Signed   By: Tiburcio Pea M.D.   On: 03/04/2023 10:06    Microbiology: Results for orders placed or performed during the hospital encounter of 03/04/23  Resp panel by RT-PCR (RSV, Flu A&B, Covid) Anterior Nasal Swab     Status: None   Collection Time: 03/04/23  9:59 AM   Specimen: Anterior  Nasal Swab  Result Value Ref Range Status   SARS Coronavirus 2 by RT PCR NEGATIVE NEGATIVE Final   Influenza A by PCR NEGATIVE NEGATIVE Final   Influenza B by PCR NEGATIVE NEGATIVE Final    Comment: (NOTE) The Xpert Xpress SARS-CoV-2/FLU/RSV plus assay is intended as an aid in the diagnosis of influenza from Nasopharyngeal swab specimens and should not be used as a sole basis for treatment. Nasal washings and aspirates are unacceptable for Xpert Xpress SARS-CoV-2/FLU/RSV testing.  Fact Sheet for Patients: BloggerCourse.com  Fact Sheet for Healthcare Providers: SeriousBroker.it  This test is not yet approved or cleared by the [place] FDA and has been authorized for detection and/or diagnosis of SARS-CoV-2 by FDA under an Emergency Use Authorization (EUA). This EUA will remain in effect (meaning this test can be used) for the duration of the COVID-19 declaration under Section 564(b)(1) of the Act, 21 U.S.C. section 360bbb-3(b)(1), unless the authorization is terminated or revoked.     Resp Syncytial Virus by  PCR NEGATIVE NEGATIVE Final    Comment: (NOTE) Fact Sheet for Patients: BloggerCourse.com  Fact Sheet for Healthcare Providers: SeriousBroker.it  This test is not yet approved or cleared by the [place] FDA and has been authorized for detection and/or diagnosis of SARS-CoV-2 by FDA under an Emergency Use Authorization (EUA). This EUA will remain in effect (meaning this test can be used) for the duration of the COVID-19 declaration under Section 564(b)(1) of the Act, 21 U.S.C. section 360bbb-3(b)(1), unless the authorization is terminated or revoked.  Performed at [facility], [address], [place], [place] [address]     Labs: CBC: Recent Labs  Lab 03/04/23 0848 03/05/23 0516 03/05/23 1048 03/05/23 1144 03/06/23 0251  WBC 8.5 5.5  --   --   11.1*  HGB 10.2* 10.8* 11.6* 11.2* 9.9*  HCT 33.1* 34.0* 34.0* 33.0* 31.2*  MCV 86.2 84.6  --   --  84.8  PLT 331 318  --   --  273   Basic Metabolic Panel: Recent Labs  Lab 03/04/23 0848 03/05/23 0516 03/05/23 1048 03/05/23 1144 03/06/23 0251 03/07/23 0228 03/08/23 0235  NA 137 136 134* 135 132* 131* 134*  K 3.6 4.1 4.0 4.2 4.6 4.6 4.1  CL 106 103  --   --  99 99 100  CO2 21* 22  --   --  21* 23 24  GLUCOSE 128* 259*  --   --  219* 254* 225*  BUN 9 19  --   --  25* 29* 24*  CREATININE 0.83 0.99  --   --  1.08* 1.31* 1.23*  CALCIUM 9.1 9.0  --   --  8.5* 8.7* 8.6*  MG  --  1.6*  --   --  2.1 1.8 2.2  PHOS  --  2.9  --   --  3.0  --  4.2   Liver Function Tests: Recent Labs  Lab 03/06/23 0251 03/08/23 0235  ALBUMIN 2.8* 2.6*   CBG: Recent Labs  Lab 03/05/23 1252 03/05/23 1618  GLUCAP 240* 241*    Discharge time spent: greater than 30 minutes.  Signed: Coralie Keens, MD [facility] 03/08/2023

## 2023-03-08 NOTE — Progress Notes (Signed)
Patient discharged home in stable condition via wheelchair.  

## 2023-03-08 NOTE — TOC Transition Note (Addendum)
Transition of Care [facility]) - Discharge Note   Patient Details  Name: Emma Hahn MRN: [id number] Date of Birth: 24-Jul-1950  [facility] CM/SW Contact:  Leone Haven, RN Phone Number: 03/08/2023, 11:17 AM   Clinical Narrative:    For dc today, NCM notified Lynette with [facility] of dc today.  She has transportation.  TOC pharmacy to fill meds.   HF clinic will follow up with her at apt for entresto, Per Misty Stanley pharmacist.         Patient Goals and CMS Choice            Discharge Placement                       Discharge Plan and Services Additional resources added to the After Visit Summary for                                       Social Drivers of Health (SDOH) Interventions SDOH Screenings   Food Insecurity: Food Insecurity Present (03/05/2023)  Housing: High Risk (03/05/2023)  Transportation Needs: No Transportation Needs (03/05/2023)  Utilities: Not At Risk (03/05/2023)  Financial Resource Strain: Medium Risk (07/11/2021)  Social Connections: Socially Isolated (03/05/2023)  Tobacco Use: High Risk (03/04/2023)     Readmission Risk Interventions    07/14/2021    2:13 PM  Readmission Risk Prevention Plan  Post Dischage Appt Complete  Medication Screening Complete  Transportation Screening Complete

## 2023-03-08 NOTE — Progress Notes (Addendum)
Rounding Note    Patient Name: Emma Hahn Date of Encounter: 03/08/2023  [facility] Cardiologist: [name], MD   Subjective   No acute overnight events. Patient is very tired this morning and states she did not sleep well last night. She states her breathing is bad but looks to be breathing comfortably. She continues to report a "little chest pain" that she states feels like food is stuck in her throat. When I try to ask her more questions about this, she states "I just want to sleep." She also states she wants to go home.  Inpatient Medications    Scheduled Meds:  aspirin EC  81 mg Oral Daily   atorvastatin  40 mg Oral Daily   budesonide (PULMICORT) nebulizer solution  0.5 mg Nebulization BID   enoxaparin (LOVENOX) injection  40 mg Subcutaneous Q24H   fluticasone  2 spray Each Nare Daily   folic acid  1 mg Oral Daily   loratadine  10 mg Oral Daily   metoprolol succinate  25 mg Oral Daily   multivitamin with minerals  1 tablet Oral Daily   pantoprazole  40 mg Oral Q0600   sacubitril-valsartan  1 tablet Oral BID   spironolactone  12.5 mg Oral Daily   thiamine  100 mg Oral Daily   Continuous Infusions:  PRN Meds: acetaminophen **OR** acetaminophen, ipratropium-albuterol, LORazepam, melatonin, senna-docusate   Vital Signs    Vitals:   03/07/23 2013 03/07/23 2106 03/08/23 0033 03/08/23 0445  BP:  102/63 96/61 (!) 95/58  Pulse:   98 68  Resp:   20 20  Temp:   (!) 97.5 F (36.4 C) (!) 97.5 F (36.4 C)  TempSrc:   Oral Oral  SpO2: 100%  97% 100%  Weight:    58.4 kg  Height:        Intake/Output Summary (Last 24 hours) at 03/08/2023 0754 Last data filed at 03/08/2023 0446 Gross per 24 hour  Intake 218 ml  Output 1000 ml  Net -782 ml      03/08/2023    4:45 AM 03/07/2023    4:44 AM 03/06/2023    4:36 AM  Last 3 Weights  Weight (lbs) 128 lb 11.2 oz 127 lb 127 lb 3.3 oz  Weight (kg) 58.378 kg 57.607 kg 57.7 kg      Telemetry     Normal sinus rhythm with rates in the 70s to 90s. Occasional PVCs. - Personally Reviewed  ECG    No new ECG tracing today. - Personally Reviewed  Physical Exam   GEN: Thin African-American female resting comfortably in no acute distress.   Neck: No JVD. Cardiac: RRR. II/VI systolic murmur noted at the apex. Respiratory: No increased work of breathing. Some expiratory rhonchi noted but otherwise clear to auscultation bilaterally. MS: No lower extremity edema. No deformity. Neuro:  No focal deficits.  Labs    High Sensitivity Troponin:   Recent Labs  Lab 03/04/23 0848 03/04/23 1125  TROPONINIHS 47* 53*     Chemistry Recent Labs  Lab 03/06/23 0251 03/07/23 0228 03/08/23 0235  NA 132* 131* 134*  K 4.6 4.6 4.1  CL 99 99 100  CO2 21* 23 24  GLUCOSE 219* 254* 225*  BUN 25* 29* 24*  CREATININE 1.08* 1.31* 1.23*  CALCIUM 8.5* 8.7* 8.6*  MG 2.1 1.8 2.2  ALBUMIN 2.8*  --  2.6*  GFRNONAA 54* 43* 46*  ANIONGAP 12 9 10     Lipids  Recent Labs  Lab 03/06/23 0251  CHOL 177  TRIG 69  HDL 54  LDLCALC 109*  CHOLHDL 3.3    Hematology Recent Labs  Lab 03/04/23 0848 03/05/23 0516 03/05/23 1048 03/05/23 1144 03/06/23 0251  WBC 8.5 5.5  --   --  11.1*  RBC 3.84* 4.02  --   --  3.68*  HGB 10.2* 10.8* 11.6* 11.2* 9.9*  HCT 33.1* 34.0* 34.0* 33.0* 31.2*  MCV 86.2 84.6  --   --  84.8  MCH 26.6 26.9  --   --  26.9  MCHC 30.8 31.8  --   --  31.7  RDW 15.3 15.1  --   --  15.2  PLT 331 318  --   --  273   Thyroid  Recent Labs  Lab 03/05/23 0516  TSH 0.784    BNP Recent Labs  Lab 03/04/23 0848  BNP 1,700.2*    DDimer No results for input(s): "DDIMER" in the last 168 hours.   Radiology    No results found.  Cardiac Studies   Echocardiogram 03/05/2023: Impressions:  1. Left ventricular ejection fraction, by estimation, is 30 to 35%. The  left ventricle has moderately decreased function. The left ventricle  demonstrates global hypokinesis. Left ventricular  diastolic function could  not be evaluated.   2. Right ventricular systolic function is normal. The right ventricular  size is normal.   3. Patent left atrial appendage seen on two chamber view. Left atrial  size was severely dilated.   4. A small pericardial effusion is present.   5. The mitral valve is abnormal. Severe mitral valve regurgitation.   6. The aortic valve is tricuspid. Aortic valve regurgitation is not  visualized. No aortic stenosis is present.   Comparison(s): Prior images reviewed side by side. Mitral valve  regurgitation is once again severe.    Patient Profile     73 y.o. female with a history of normal coronaries on cardiac catheterization in 11/2019, non-ischemic cardiomyopathy/ chronic HFrEF with EF of 30-35% in 11/2021, severe mitral regurgitation, COPD, prediabetes, alcohol abuse, and tobacco abuse who was admitted on 03/04/2023 for acute on chronic CHF and a mild COPD exacerbation after presenting with worsening shortness of breath, orthopnea, and cough. Cardiology consulted to assist with management of CHF.  Assessment & Plan    Acute on Chronic HFrEF Non-Ischemic Cardiomyopathy BNP 1,700. Chest x-ray consistent with CHF. Echo this admission showed LVEF of 30-35% with global hypokinesis, normal RV function, severe MR, and small pericardial effusion. He was diuresed with IV Lasix and then switched to PO Lasix on 2/19 but this was held yesterday due to renal function. Net negative 1.7 L this admission. Weight 128 lbs yesterday (down from 139 lbs). Creatinine coming down. - Euvolemic on exam. - Can resume home PO Lasix 40mg  twice daily. - Continue Entresto 24-26mg  twice daily. - Continue Toprol-XL 25mg  daily. - Continue Spironolactone 12.5mg  daily. - She has previously not been on SGLT2 inhibitor due to concern for volume depletion. BP is soft with systolic BP in the 90s so will continue to hold off on this. - Continue to monitor daily weights, strict I/Os, and  renal function. - She already has a follow-up visit in the [facility] clinic scheduled for 03/22/2023.  Chest Pain Elevated Troponin Patient did report some mild vague chest discomfort on admission. Prior cardiac catheterization in 2021 showed normal coronaries. High-sensitivity troponin this admission minimally elevated and flat at 47 >> 53. Echo as above. - She continues to complain  over very mild and vague chest pain that she describes as a feeling like food is stuck in her throat. - Symptoms and troponin elevation initially felt to be secondary to volume overload rather than ACS. She looks euvolemic on exam but continues to report atypical chest pain. Low suspicion that this is cardiac in nature. No plans for ischemic evaluation at this time.  Severe Mitral Regurgitation Patient has a known history of mitral regurgitation. Noted to be severe in 2021 but then read mild on Echo in 11/2021. Repeat Echo this admission again show severe MR.  - Patient was previously seen by Dr. [name] for consideration of mitral valve repair. However, she has a history of alcohol abuse and has had poor follow-up with Cardiology and questionable medication compliance. If she continues to be compliant with medications and follows up well as an outpatient, could consider repeat evaluation as an outpatient.  Non-Sustained VT She was noted to have some NSVT on telemetry this admission. -Review of telemetry this morning shows occasional PVCs but no recurrent NSVT. - Continue Toprol-XL as above.  Otherwise, per primary team: - COPD - Alcohol abuse - Tobacco abuse  For questions or updates, please contact [facility] Please consult [email or web address] for contact info under        Signed, Corrin Parker, PA-C  03/08/2023, 7:54 AM     Attending Note:   The patient was seen and examined.  Agree with assessment and plan as noted above.  Changes made to the above note as needed.  Patient seen and  independently examined with Marjie Skiff, PA .   We discussed all aspects of the encounter. I agree with the assessment and plan as stated above.     HFrEF - acute on chronic .   Seems better .  She has diuresed 1.7 liters.    She is stable and wants to go home.   I agree that she can be discharged.     She will follow up with the CHF clinic .   2.  Severe MR :   follow up with the CHF clinic.   She has been referred to the valve clinic but was thought to be a poor candidate for MV repair    I have spent a total of 40 minutes with patient reviewing hospital  notes , telemetry, EKGs, labs and examining patient as well as establishing an assessment and plan that was discussed with the patient.  > 50% of time was spent in direct patient care.    Vesta Mixer, Montez Hageman., MD, [facility] 03/08/2023, 9:49 AM [address]  [address] Office [phone] Pager [phone]

## 2023-03-12 ENCOUNTER — Telehealth: Payer: Self-pay

## 2023-03-12 NOTE — Patient Instructions (Signed)
 Visit Information  Thank you for taking time to visit with me today. Please don't hesitate to contact me if I can be of assistance to you.   Following are the goals we discussed today:   Goals Addressed             This Visit's Progress    Care Coordination Activities-No follow up required       9:40 am Received incoming call from Lysbeth Galas [phone] from [facility].  She called in hopes of getting CM to outreach patient as she has not been able to get patient to set up home health.  [facility] was the assigned agency in the hospital.  Advised that CM would try to  reach patient.  1:41 pm Telephone call to patient. She states she is doing okay.  Explained to her that [facility] was trying to reach her for home health. She states right now she is trying to get another home health company with someone she knows and has that person on the other line.  Advised her that she does have a choice in home health companies to choose.  Also advised that CM would let [facility] know.  She verbalized understanding.  1:45 pm- RN CM called Haywood Lasso at [facility] to inform her that patient is trying to go with another home health company.  She verbalized understanding and will follow up if patient calls her back.          If you are experiencing a Mental Health or Behavioral Health Crisis or need someone to talk to, please call the Suicide and Crisis Lifeline: 988   The patient verbalized understanding of instructions, educational materials, and care plan provided today and DECLINED offer to receive copy of patient instructions, educational materials, and care plan.   The patient has been provided with contact information for the care management team and has been advised to call with any health related questions or concerns.   Bary Leriche RN, MSN [facility], [facility] RN Care Manager Direct Dial: [phone]  Fax: [phone] Website: [email or web address]

## 2023-03-12 NOTE — Patient Outreach (Signed)
 Care Coordination   Care Coordionation  Visit Note   03/12/2023 Name: Emma Hahn MRN: [id number] DOB: 1950-05-14  Emma Hahn is a 74 y.o. year old female who sees Norm Salt, [place] for primary care. I spoke with  Emma Hahn by phone today.  What matters to the patients health and wellness today?  Getting home health lined up.     Goals Addressed             This Visit's Progress    Care Coordination Activities-No follow up required       9:40 am Received incoming call from Emma Hahn [phone] from [facility].  She called in hopes of getting CM to outreach patient as she has not been able to get patient to set up home health.  [facility] was the assigned agency in the hospital.  Advised that CM would try to  reach patient.  1:41 pm Telephone call to patient. She states she is doing okay.  Explained to her that [facility] was trying to reach her for home health. She states right now she is trying to get another home health company with someone she knows and has that person on the other line.  Advised her that she does have a choice in home health companies to choose.  Also advised that CM would let [facility] know.  She verbalized understanding.  1:45 pm- RN CM called Emma Hahn at [facility] to inform her that patient is trying to go with another home health company.  She verbalized understanding and will follow up if patient calls her back.          SDOH assessments and interventions completed:  No     Care Coordination Interventions:  Yes, provided   Follow up plan: No further intervention required.   Encounter Outcome:  Patient Visit Completed   Emma Leriche RN, MSN [facility], [facility] RN Care Manager Direct Dial: [phone]  Fax: [phone] Website: [email or web address]

## 2023-03-12 NOTE — TOC Progression Note (Signed)
 Transition of Care [facility]) - Progression Note    Patient Details  Name: Emma Hahn MRN: [id number] Date of Birth: [birth year]  Transition of Care [facility]) CM/SW Contact  Leone Haven, RN Phone Number: 03/12/2023, 3:03 PM  Clinical Narrative:    This NCM received call from [facility] with [facility], stating they have been trying to get in touch with this patient to do soc for [facility] services. The patient did inform them that she will get her own [facility] set up.  This NCM tried to reach out to patient, at [phone], vm came on stating vm has not been set up.  Could not leave a message.        Expected Discharge Plan and Services         Expected Discharge Date: 03/08/23                                     Social Determinants of Health (SDOH) Interventions SDOH Screenings   Food Insecurity: Food Insecurity Present (03/05/2023)  Housing: High Risk (03/05/2023)  Transportation Needs: No Transportation Needs (03/05/2023)  Utilities: Not At Risk (03/05/2023)  Financial Resource Strain: Medium Risk (07/11/2021)  Social Connections: Socially Isolated (03/05/2023)  Tobacco Use: High Risk (03/04/2023)    Readmission Risk Interventions    07/14/2021    2:13 PM  Readmission Risk Prevention Plan  Post Dischage Appt Complete  Medication Screening Complete  Transportation Screening Complete

## 2023-03-22 ENCOUNTER — Encounter (HOSPITAL_COMMUNITY): Payer: Medicare Other

## 2023-03-25 ENCOUNTER — Ambulatory Visit: Payer: Medicare Other | Admitting: Physician Assistant

## 2023-03-27 ENCOUNTER — Other Ambulatory Visit (HOSPITAL_COMMUNITY): Payer: Self-pay

## 2023-04-23 ENCOUNTER — Telehealth: Payer: Self-pay

## 2023-04-23 NOTE — Progress Notes (Signed)
   04/23/2023  Patient ID: Emma Hahn, female   DOB: 04-15-1950, 73 y.o.   MRN: [id number]  Contacted patient regarding medication adherence from a quality report for [facility]. The patient failed MAC in 2024.    Left patient a voicemail to return my call at their convenience  Harlon Flor, PharmD Clinical Pharmacist  [phone]

## 2023-10-13 NOTE — Discharge Summary (Addendum)
 Discharge Summary  ALLERGIES: No Known Allergies CODE STATUS: Full CodeCPR  Name: LENICE KOPER ([id number]) Age: 73 y.o. DOB: 1950-10-31 Room: 6S-115/6S-115-A Patient Primary Contact: [phone] (home) [phone] (work)  Primary Emergency Contact: Alexa Pao, Home Phone: [phone] Date of Admission: 10/12/2023 Discharge Date: 10/14/2023   Attending Physician: Sheppard Specter, MD Primary Care Provider: LENNOX, VERIFIED Isolation Status: No active infections   Discharge Diagnoses  Diagnoses addressed during this hospitalization: # Acute decompensated heart failure on HFrEF (EF 30-35%) # COPD exacerbation  # AUD #T2DM  # microcytic anemia # acute kidney injury # HLD  # HTN    Discharge Orders  Diet/Activity/Dressing: Discharge Diet Order      Start     Ordered   10/14/23 0000  Discharge Diet       Question:  Diet recommendation:  Answer:  You should resume the home diet you had before you were hospitalized.   10/14/23 1558           Discharge Activity Order      Start     Ordered   10/14/23 0000  Discharge Activity       Question:  Activity level  Answer:  Resume normal activity level as tolerated   10/14/23 1558           Current inpatient wound care order  WOUND CARE DRESSING (720h ago, onward)    None       Weightbearing Status (restrictions below if applicable): There are no questions and answers to display.    Care Management, Home Health, DME Needs: Does The Patient Have A Lay Caregiver?: Yes Support Systems: Family member(s)    Discharge Status and Follow Up  Condition: Stable Disposition: Home Lay Caregiver identified by patient:  ,    Appointments The patient should make the following appointments as instructed:  Follow up appointments: (Call to confirm and/or arrange today.)     Iantha Catarina Camp, MD .   Contact information [address] [place] [address] [phone]                Scheduled upcoming appointments:     Discharge Medications     Medication List     START taking these medications      Notes  albuterol  108 (90 BASE) mcg/act aerosol solution Take 2 puffs by inhalation every 6 hours as needed. Commonly known as: VENTOLIN  HFA      atorvastatin  40 MG tablet Take 40 mg by mouth. Commonly known as: LIPITOR      empagliflozin  10 MG Tabs tablet Take 1 tablet by mouth daily. Commonly known as: JARDIANCE       nicotine 21 MG/24HR patch Place 1 patch onto the skin daily for 30 days. Commonly known as: NICODERM CQ Start taking on: October 15, 2023          CHANGE how you take these medications      Notes  furosemide  40 MG tablet Take 1 tablet by mouth daily. Commonly known as: LASIX  What changed: when to take this      losartan  25 MG tablet Take 0.5 tablets by mouth daily. Commonly known as: COZAAR  What changed: when to take this      spironolactone  25 MG tablet Take 1 tablet by mouth daily. Commonly known as: ALDACTONE  What changed: when to take this          CONTINUE taking these medications      Notes  metoprolol  succinate 25 MG tablet extended release 24  HR Take 1 tablet by mouth daily. Commonly known as: TOPROL  XL      sacubitril -valsartan  24-26 MG tablet Take 1 tablet by mouth 2 times daily. Commonly known as: ENTRESTO             Your prescriptions may be filled at any pharmacy     These medications were sent to [id number] - [place], [place] - [address]  [address] [place] [address]    Phone: [phone]  sacubitril -valsartan  24-26 MG tablet    These medications were sent to [facility] at [facility], [place] - [address]  [address] [place] [address]    Phone: [phone]  empagliflozin  10 MG Tabs tablet furosemide  40 MG tablet losartan  25 MG tablet metoprolol  succinate 25 MG tablet extended release 24 HR nicotine 21 MG/24HR  patch spironolactone  25 MG tablet      Hospital Course  Girardot is a 73 y.o.  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 (EF 30-35%) # COPD exacerbation  Trop 32; BNP [id number] X-ray chest Cardiomegaly with interstitial congestive changes  Echo EF 20-25% - Lasix  IV - Douneb, Pumicort - Prednisone  40 mg for 5 days  - Azithromycin 500 mg for 3 days - Oxygen therapy Nasal canula on admission, Patient wean off Oxygen  and stable on room air before discharge   # AUD - CIWA protocol - Thiamine    # T2DM   Resume home medication  # microcytic anemia - Iron tablet   # acute kidney injury, resolved # HLD  - atorvastatin  40 mg  # HTN - Losartan  25, metoprolol  succinate 25,  * No surgery found * Consults:  Cardiology  No notes on file  Treatment Team:  Treatment Team       Provider   Role Specialty    [facility], INTERNAL MEDICINE TEAM B     Team --         HPI  CC: Acute on chronic congestive heart failure, unspecified heart failure type (CMS/HHS-HCC) Reason for admission:  NAHIMA ALES is a 73 y.o. female with history of HTN, HFrEF (EF 30-35%, echo Feb 2025), HLD, COPD, and alcohol use disorder, presenting with worsening dyspnea and leg swelling.   She reports chronic lower extremity edema that has worsened over the past several days, now associated with dyspnea on exertion and a sensation of "fluid up to her lungs as similar to her prior CHF admission in Feb. She describes a productive cough with clear sputum, denies fever, chest pain, or palpitations. [facility] visited [facility] but was advised to go to the ED. She admits to intermittent medication adherence due to cost and has not taken her medications today, including furosemide .  Of note, She has a long-standing history of COPD and alcohol use .  Please see admission history and physical examination for additional details.    Problem List and History  Problem List: Principal Problem:   Acute on chronic congestive heart failure, unspecified heart failure type (CMS/HHS-HCC) Active Problems:   HTN (hypertension)   HFrEF (heart failure with reduced ejection fraction) (CMS/HHS-HCC)   HLD (hyperlipidemia)   COPD (chronic obstructive pulmonary disease) (CMS/HHS-HCC)   Alcohol use disorder Resolved Problems:   * No resolved hospital problems. *  History reviewed. No pertinent past medical history. No past surgical  history on file.   Medications at Admission  Prior to Admission Medications  Prescriptions Last Dose Informant Patient Reported? Taking?  atorvastatin  (LIPITOR) 40 MG tablet Not Taking  Yes No  Sig: Take 40 mg by mouth.  Patient not taking: Reported on 10/12/2023  furosemide  (LASIX ) 40 MG tablet Not Taking  Yes No  Sig: Take 40 mg by mouth.  Patient not taking: Reported on 10/12/2023  losartan  (COZAAR ) 25 MG tablet Not Taking  Yes No  Sig: Take 12.5 mg by mouth.  Patient not taking: Reported on 10/12/2023  metoprolol  succinate (TOPROL  XL) 25 MG tablet extended release 24 HR Not Taking  Yes No  Sig: Take 25 mg by mouth daily.  Patient not taking: Reported on 10/12/2023    Facility-Administered Medications: None      Current Physical Exam  Temp:  [36.1 C (97 F)-36.4 C (97.6 F)] 36.2 C (97.2 F) Pulse:  [94-106] 94 Heart Rate (Monitored):  [94-106] 94 Resp:  [12-41] 14 BP: (104-132)/(74-84) 110/74 MAP:  [85 MM HG-97 MM HG] 86 MM HG SpO2:  [94 %-100 %] 94 % Physical Exam Constitutional:      General: She is in NAD HENT:     Head: Normocephalic.  Eyes:     Extraocular Movements: Extraocular movements intact.  Cardiovascular:     Rate and Rhythm: Normal rate and regular rhythm.  Pulmonary:  Rales at base bilaterally  Abdominal:     Palpations: Abdomen is soft.  Musculoskeletal:        General: Swelling present.     Cervical back: Neck supple.     Right lower leg: Edema present.      Left lower leg: Edema present.     Comments: Pitting edema 1+ bilaterally   Skin:    Capillary Refill: Capillary refill takes less than 2 seconds.  Neurological:     Mental Status: She is alert and oriented to person, place, and time.    Detailed Lab and Radiology Findings       10/14/23 0431  WBC 6.4  HEMOGLOBIN 9.6*  HEMATOCRIT 30.3*  PLATELETS 244        10/14/23 0431 10/13/23 0404  SODIUM 133* 133*  POTASSIUM 4.6 4.3  CHLORIDE 102 103  CO2 TOTAL 17* 17*  BUN 29* 23  CREATININE 1.3* 1.2  GLUCOSE BLD 198* 215*  CALCIUM  9.0 9.0  MAGNESIUM  1.7 1.4*  PHOSPHORUS 4.1 3.6  AST  --  32  ALT  --  16  ALK PHOS  --  65  BILIRUBIN TOTAL  --  1.2*        10/12/23 2125  HEMOGLOBIN A1C 6.62*   Microbiology Results     ** No results found for the last 336 hours. **      Transthoracic Echo (10/13/2023, 12: 28 PM)  Electronically Signed by: Debbrah Rana, MD                                       10/13/2023, 3: 25 PM Ordering Physician: OSWALDO BRING Referring Physician: OSWALDO BRING Performed By: Norlene Barnie Don   Last Image Reports  XR CHEST AP (PORTABLE) 07/11/2021   Additional labs and imaging (if applicable): CBC:       10/14/23 0431 10/13/23 0404 10/12/23 1352  WBC 6.4 5.3 8.0  RBC 4.12 4.43 4.61  HEMOGLOBIN 9.6* 10.4* 10.9*  HEMATOCRIT 30.3* 33.1* 34.8*  PLATELETS 244 250  261   , BMP:       10/14/23 0431 10/13/23 0404 10/12/23 1352  GLUCOSE BLD 198* 215* 131*  SODIUM 133* 133* 136  POTASSIUM 4.6 4.3 4.8  CHLORIDE 102 103 103  CO2 TOTAL 17* 17* 19*  BUN 29* 23 25*  CREATININE 1.3* 1.2 1.5*  CALCIUM  9.0 9.0 10.0   , CMP:       10/14/23 0431 10/13/23 0404 10/12/23 1352  GLUCOSE BLD 198* 215* 131*  SODIUM 133* 133* 136  POTASSIUM 4.6 4.3 4.8  CHLORIDE 102 103 103  CO2 TOTAL 17* 17* 19*  BUN 29* 23 25*  CREATININE 1.3* 1.2 1.5*  CALCIUM  9.0 9.0 10.0  ALBUMIN  --  3.3* 3.7  TOTAL PROTEIN  --  7.2 7.8  ALK PHOS  --   65 72  ALT  --  16 19  AST  --  32 40*  BILIRUBIN TOTAL  --  1.2* 1.3*   , Coagulation:       10/12/23 2121  PROTIME 14.6  INR 1.1  PTT 27.9   , UA:  No results found for requested labs within last 30 days.  , Creatinine: Most Recent Result within 30 Days Component Result  Creatinine 1.3* (10/14/2023)  , Anemia:       10/13/23 0404 10/12/23 2125  FERRITIN 82  --   IRON 26*  --   TRANSFERRIN 375*  --   TIBC 489  --   % IRON SATURATION 5  --   FOLATE  --  14.8  VITAMIN B 12  --  931  , Cardiac:       10/12/23 1352  NT-PROBNP 10,682*  , and Thyroid:       10/12/23 2121  TSH 1.790   Blood Culture: No results found for this visit on 10/12/23.   Urine Culture: No results found for this visit on 10/12/23.  All Results Last 2 weeks:  Microbiology Results     ** No results found for the last 336 hours. **       All Results Last 3 days:  Micro Results Last 72 hrs     ** No results found for the last 72 hours. **       Last 4 days: XR Chest Single View Result Date: 10/12/2023 IMPRESSION: Cardiomegaly with interstitial congestive changes and trace blunting of the right costophrenic angle suggests pulmonary venous hypertension versus early CHF. Streaky left basilar infiltrate.  Impression Only: CAR Echo 2D Adult Complete Result Date: 10/13/2023                                                                                                   Version: 1  Study ID: [id number]                                                                       [facility]                                                                        [place], [place] [address]                                                                   Adult Echocardiogram Report Name: SAAMIYA, JEPPSEN                               Study Date: 10/13/2023, 12: 28 PM       BP: 113 / 74 mmHg MRN: [id number]                                      Patient Location: CAP-IMC^[id number]^6S-115-A^PMS HR: 99 bpm DOB: 03-21-1950                                     Gender: Female                         Height: 162.56 cm Age: 14 Years  Weight: 65.772 kg                                                                                                BSA: 1.71 m Reason For Study: CHF MMode/2D Measurements & Calculations Left Ventricle: LVIDd: 5.7 cm LVIDs: 5.1 cm Right Ventricle: RVDd: 2.9 cm                                        RV Length_phl: 6.3 cm                                                        RV Base_phl: 4.0 cm                                                        RV Mid_phl: 3.6 cm Atria: LA dimension: 4.0 cm                                LAV(MOD-sp4): 55.3 ml LA ESV Index (A4C): 32.3 ml/m                      LAV(MOD-sp2): 66.1 ml LA ESV Index (BP): 37.6 ml/m RA A4Cs_phl: 26.8 cm Aorta: IVC: Doppler Measurements & Calculations Mitral Valve: MV E max vel: 122.0 cm/sec                          MV V2 max: 115.0 cm/sec                       MR max vel: 506.0 cm/sec MV A max vel: 61.6 cm/sec                           MV max PG: 5.0 mmHg                           MR max PG: 102.4 mmHg MV E/A: 1.98                                         MV V2 VTI: 17.2 cm                           MR VTI: 121.8 cm MV dec time: 0.12 sec  MV mean PG: 1.38 mmHg                         MR mean PG: 56.8 mmHg MV dec slope: 921.2 cm/sec                         MV V2 mean: 47.3 cm/sec                       MR mean vel: 339.0 cm/sec MV P1/2t-pr_phl: 38.9 msec MV P1/2t max vel: 121.1 cm/sec MVA(P1/2t): 5.7 cm Aortic Valve: LVOT Vmax: 55.7 cm/sec                             AoV Vmax: 85.3  cm/sec                          AV VR: 0.64 LV V1 mean PG: 0.63 mmHg                           Ao max PG: 3.0 mmHg LV V1 VTI: 7.8 cm                                    Ao mean PG: 1.66 mmHg LVOT max PG: 1.00 mmHg                             Ao V2 VTI: 11.2 cm Tricuspid Valve: TR max vel: 199.1 cm/sec                            RAP systole: 10.0 mmHg TR max PG: 16.1 mmHg                                RVSP(TR): 26.1 mmHg TAPSE: 1.12 cm Pulmonic Valve: PI end-d vel: 152.0 cm/sec                           PV Vmax: 90.9 cm/sec                                                        PV PG: 3.0 mmHg Diastolic Function: Med Peak E' Vel: 4.8 cm/sec                          Lat Peak E' Vel: 5.7 cm/sec                  RV S Vel_phl: 11.1 cm/sec Medial E/e': 25.5                                     Lat E/e': 21.6 Procedure A two-dimensional transthoracic echocardiogram with color flow Doppler was performed. ([id number], [id number]). A complete two-dimensional transthoracic echocardiogram was performed (2D, M-mode, spectral and color flow  Doppler). (06692,[id number]). Study quality is good. Indications Echo was performed for suspected Hypertension. Left Ventricle There is normal left ventricular wall thickness. The left ventricle is mild to moderate dilated. No evidence of thrombus seen on this study. Ejection Fraction = 20-25%. LV diastolic filling appears normal. There is severe global hypokinesis of the left ventricle. Right Ventricle The right ventricle is mildly dilated. The right ventricular systolic function is normal. The right ventricular wall motion is normal. Atria The left atrium is mildly dilated. The right atrium is moderate to severely dilated. Mitral Valve Mitral valve structure is normal. There is no evidence of mitral valve prolapse. There is moderate mitral regurgitation. The regurgitant jet is posteriorly directed. The severity of mitral regurgitation may be UNDERestimated due to the eccentric nature of the  regurgitant jet. No mitral valve stenosis. Tricuspid Valve Anatomically normal tricuspid valve. There is no tricuspid valve prolapse. There is moderate to severe tricuspid regurgitation. Estimated RVSP is mildly elevated at 36 mmHg. No evidence of tricuspid stenosis. Aortic Valve The aortic valve appears structurally normal. The aortic valve is trileaflet. No aortic regurgitation. There is no aortic stenosis. Pulmonic Valve No evidence of stenosis. The pulmonic valve is normal. There is no vegetation seen on the pulmonic valve. There is no pulmonic valve regurgitation. Great Vessels The ascending aorta is normal in size. The aortic root is normal in size. IVC Dilated IVC consistent with elevated RA pressure. Pericardium Trivial pericardial effusion, not hemodynamically significant. There is no pleural effusion noted on this exam. Summary Statements Electronically Signed by: Debbrah Rana, MD                                       10/13/2023, 3: 25 PM Ordering Physician: OSWALDO BRING Referring Physician: OSWALDO BRING Performed By: Norlene Barnie Don   XR Chest Single View Result Date: 10/12/2023 EXAM: Single view portable chest 1405 hours HISTORY: 73 year old female, shortness of breath TECHNIQUE: AP portable upright view of the chest COMPARISON: No prior studies. FINDINGS: There is marked cardiomegaly with aortic atherosclerosis. There is mild prominence of central pulmonary vasculature without redistribution, and a diffusely prominent fine reticulonodular interstitial pattern, with a few faint Kerley B lines toward the bases. There is a streaky left basilar retrocardiac infiltrate. No other focal infiltrate or consolidation is seen. Is mild blunting of the right costophrenic angle, the left costophrenic angle is sharp. Bony structures are unremarkable.   IMPRESSION: Cardiomegaly with interstitial congestive changes and trace blunting of the right costophrenic angle suggests pulmonary venous  hypertension versus early CHF. Streaky left basilar infiltrate.  Last Echo:  Recent Results (from the past 8760 hours)  CAR Echo 2D Adult Complete  Result Value Ref Range Status   SystolicPressure 113 mmHg Final   LVIDd 5.7 cm Final   LVIDs 5.1 cm Final   LAV(MOD-sp2) 66.1 ml Final   LAV(MOD-sp4) 55.3 ml Final   RA A4Cs_phl  26.8 cm^2 Final   RVSP 26.1 mmHg Final   EF [id number] 20-25 % Final   Narrative  Version: 1                                                                                                    Study ID: [id number]                                                                        [facility]                                                                        [place], [place] [address]                                                                   Adult Echocardiogram Report  Name: MIRTHA, JAIN                               Study Date: 10/13/2023, 12: 28 PM       BP: 113 / 74 mmHg MRN: [id number]                                      Patient Location: CAP-IMC^CAP6S115^6S-115-A^PMS HR: 99 bpm DOB: 1950/05/20  Gender: Female                         Height: 162.56 cm Age: 27 Years                                                                                Weight: 65.772 kg                                                                                                 BSA: 1.71 m Reason For Study: CHF MMode/2D Measurements & Calculations Left Ventricle: LVIDd: 5.7 cm LVIDs: 5.1 cm Right Ventricle: RVDd: 2.9 cm                                        RV Length_phl: 6.3 cm                                                        RV Base_phl: 4.0 cm                                                         RV Mid_phl: 3.6 cm Atria: LA dimension: 4.0 cm                                LAV(MOD-sp4): 55.3 ml LA ESV Index (A4C): 32.3 ml/m                      LAV(MOD-sp2): 66.1 ml LA ESV Index (BP): 37.6 ml/m RA A4Cs_phl: 26.8 cm Aorta: IVC:  Doppler Measurements & Calculations Mitral Valve: MV E max vel: 122.0 cm/sec                          MV V2 max: 115.0 cm/sec                        MR max vel: 506.0 cm/sec MV A max vel: 61.6 cm/sec                           MV max PG: 5.0 mmHg  MR max PG: 102.4 mmHg MV E/A: 1.98                                         MV V2 VTI: 17.2 cm                            MR VTI: 121.8 cm MV dec time: 0.12 sec                               MV mean PG: 1.38 mmHg                          MR mean PG: 56.8 mmHg MV dec slope: 921.2 cm/sec                         MV V2 mean: 47.3 cm/sec                        MR mean vel: 339.0 cm/sec MV P1/2t-pr_phl: 38.9 msec MV P1/2t max vel: 121.1 cm/sec MVA(P1/2t): 5.7 cm Aortic Valve: LVOT Vmax: 55.7 cm/sec                             AoV Vmax: 85.3 cm/sec                           AV VR: 0.64 LV V1 mean PG: 0.63 mmHg                           Ao max PG: 3.0 mmHg LV V1 VTI: 7.8 cm                                    Ao mean PG: 1.66 mmHg LVOT max PG: 1.00 mmHg                             Ao V2 VTI: 11.2 cm Tricuspid Valve: TR max vel: 199.1 cm/sec                            RAP systole: 10.0 mmHg TR max PG: 16.1 mmHg                                RVSP(TR): 26.1 mmHg TAPSE: 1.12 cm Pulmonic Valve: PI end-d vel: 152.0 cm/sec                           PV Vmax: 90.9 cm/sec                                                        PV PG: 3.0 mmHg Diastolic Function: Med Peak E' Vel: 4.8 cm/sec  Lat Peak E' Vel: 5.7 cm/sec                   RV S Vel_phl: 11.1 cm/sec Medial E/e': 25.5                                      Lat E/e': 21.6  Procedure A two-dimensional transthoracic echocardiogram with color flow Doppler was performed. ([id number], [id number]). A complete two-dimensional transthoracic echocardiogram was performed (2D, M-mode, spectral and color flow Doppler). ([id number]). Study quality is good. Indications Echo was performed for suspected Hypertension. Left Ventricle There is normal left ventricular wall thickness. The left ventricle is mild to moderate dilated. No evidence of thrombus seen on this study. Ejection Fraction = 20-25%. LV diastolic filling appears normal. There is severe global hypokinesis of the left ventricle. Right Ventricle The right ventricle is mildly dilated. The right ventricular systolic function is normal. The right ventricular wall motion is normal. Atria The left atrium is mildly dilated. The right atrium is moderate to severely dilated. Mitral Valve Mitral valve structure is normal. There is no evidence of mitral valve prolapse. There is moderate mitral regurgitation. The regurgitant jet is posteriorly directed. The severity of mitral regurgitation may be UNDERestimated due to the eccentric nature of the regurgitant jet. No mitral valve stenosis. Tricuspid Valve Anatomically normal tricuspid valve. There is no tricuspid valve prolapse. There is moderate to severe tricuspid regurgitation. Estimated RVSP is mildly elevated at 36 mmHg. No evidence of tricuspid stenosis. Aortic Valve The aortic valve appears structurally normal. The aortic valve is trileaflet. No aortic regurgitation. There is no aortic stenosis. Pulmonic Valve No evidence of stenosis. The pulmonic valve is normal. There is no vegetation seen on the pulmonic valve. There is no pulmonic valve regurgitation. Great Vessels The ascending aorta is normal in size. The aortic root is normal in size. IVC Dilated IVC consistent with elevated RA pressure. Pericardium Trivial pericardial effusion, not  hemodynamically significant. There is no pleural effusion noted on this exam.  Summary Statements  Electronically Signed by: Debbrah Rana, MD                                       10/13/2023, 3: 25 PM Ordering Physician: OSWALDO BRING Referring Physician: OSWALDO BRING Performed By: Norlene Barnie Don     *Note: Due to a large number of results and/or encounters for the requested time period, some results have not been displayed. A complete set of results can be found in Results Review.   All Results This Admission:  Results for orders placed or performed during the hospital encounter of 10/12/23 (from the past 8640 hours)  XR Chest Single View   Collection Time: 10/12/23  2:22 PM   Narrative   EXAM: Single view portable chest 1405 hours  HISTORY: 73 year old female, shortness of breath  TECHNIQUE: AP portable upright view of the chest  COMPARISON: No prior studies.  FINDINGS: There is marked cardiomegaly with aortic atherosclerosis. There is mild prominence of central pulmonary vasculature without redistribution, and a diffusely prominent fine reticulonodular interstitial pattern, with a few faint Kerley B lines toward the bases. There is a streaky left basilar retrocardiac infiltrate. No other focal infiltrate or consolidation is seen. Is mild blunting of the right costophrenic angle, the left costophrenic angle is sharp.  Bony structures  are unremarkable.    Impression   IMPRESSION: Cardiomegaly with interstitial congestive changes and trace blunting of the right costophrenic angle suggests pulmonary venous hypertension versus early CHF.  Streaky left basilar infiltrate.  CAR Echo 2D Adult Complete   Collection Time: 10/13/23 12:28 PM  Result Value   SystolicPressure 113   LVIDd 5.7   LVIDs 5.1   LAV(MOD-sp2) 66.1   LAV(MOD-sp4) 55.3   RA A4Cs_phl  26.8   RVSP 26.1   EF [id number] 20-25   Narrative                                                                                                       Version: 1                                                                                                    Study ID: [id number]                                                                        [facility]                                                                        [place], [place] [address]  Adult Echocardiogram Report  Name: MAIJA, BIGGERS                               Study Date: 10/13/2023, 12: 28 PM       BP: 113 / 74 mmHg MRN: [id number]                                      Patient Location: CAP-[facility]^[id number]^[id number]-115-A^PMS HR: 99 bpm DOB: [birth year]                                     Gender: Female                         Height: 162.56 cm Age: 25 Years                                                                                Weight: 65.772 kg                                                                                                 BSA: 1.71 m Reason For Study: CHF MMode/2D Measurements & Calculations Left Ventricle: LVIDd: 5.7 cm LVIDs: 5.1 cm Right Ventricle: RVDd: 2.9 cm                                        RV Length_phl: 6.3 cm                                                        RV Base_phl: 4.0 cm                                                        RV Mid_phl: 3.6 cm Atria: LA dimension: 4.0 cm                                LAV(MOD-sp4): 55.3 ml LA ESV Index (A4C): 32.3 ml/m                      LAV(MOD-sp2):  66.1 ml LA ESV Index (BP): 37.6 ml/m RA A4Cs_phl: 26.8 cm Aorta: IVC:  Doppler Measurements & Calculations Mitral Valve: MV E max vel: 122.0 cm/sec                          MV V2 max: 115.0 cm/sec                        MR max vel: 506.0  cm/sec MV A max vel: 61.6 cm/sec                           MV max PG: 5.0 mmHg                            MR max PG: 102.4 mmHg MV E/A: 1.98                                         MV V2 VTI: 17.2 cm                            MR VTI: 121.8 cm MV dec time: 0.12 sec                               MV mean PG: 1.38 mmHg                          MR mean PG: 56.8 mmHg MV dec slope: 921.2 cm/sec                         MV V2 mean: 47.3 cm/sec                        MR mean vel: 339.0 cm/sec MV P1/2t-pr_phl: 38.9 msec MV P1/2t max vel: 121.1 cm/sec MVA(P1/2t): 5.7 cm Aortic Valve: LVOT Vmax: 55.7 cm/sec                             AoV Vmax: 85.3 cm/sec                           AV VR: 0.64 LV V1 mean PG: 0.63 mmHg                           Ao max PG: 3.0 mmHg LV V1 VTI: 7.8 cm                                    Ao mean PG: 1.66 mmHg LVOT max PG: 1.00 mmHg                             Ao V2 VTI: 11.2 cm Tricuspid Valve: TR max vel: 199.1 cm/sec  RAP systole: 10.0 mmHg TR max PG: 16.1 mmHg                                RVSP(TR): 26.1 mmHg TAPSE: 1.12 cm Pulmonic Valve: PI end-d vel: 152.0 cm/sec                           PV Vmax: 90.9 cm/sec                                                        PV PG: 3.0 mmHg Diastolic Function: Med Peak E' Vel: 4.8 cm/sec                          Lat Peak E' Vel: 5.7 cm/sec                   RV S Vel_phl: 11.1 cm/sec Medial E/e': 25.5                                     Lat E/e': 21.6  Procedure A two-dimensional transthoracic echocardiogram with color flow Doppler was performed. ([id number], [id number]). A complete two-dimensional transthoracic echocardiogram was performed (2D, M-mode, spectral and color flow Doppler). ([id number]). Study quality is good. Indications Echo was performed for suspected Hypertension. Left Ventricle There is normal left ventricular wall thickness. The left ventricle is mild to moderate dilated. No  evidence of thrombus seen on this study. Ejection Fraction = 20-25%. LV diastolic filling appears normal. There is severe global hypokinesis of the left ventricle. Right Ventricle The right ventricle is mildly dilated. The right ventricular systolic function is normal. The right ventricular wall motion is normal. Atria The left atrium is mildly dilated. The right atrium is moderate to severely dilated. Mitral Valve Mitral valve structure is normal. There is no evidence of mitral valve prolapse. There is moderate mitral regurgitation. The regurgitant jet is posteriorly directed. The severity of mitral regurgitation may be UNDERestimated due to the eccentric nature of the regurgitant jet. No mitral valve stenosis. Tricuspid Valve Anatomically normal tricuspid valve. There is no tricuspid valve prolapse. There is moderate to severe tricuspid regurgitation. Estimated RVSP is mildly elevated at 36 mmHg. No evidence of tricuspid stenosis. Aortic Valve The aortic valve appears structurally normal. The aortic valve is trileaflet. No aortic regurgitation. There is no aortic stenosis. Pulmonic Valve No evidence of stenosis. The pulmonic valve is normal. There is no vegetation seen on the pulmonic valve. There is no pulmonic valve regurgitation. Great Vessels The ascending aorta is normal in size. The aortic root is normal in size. IVC Dilated IVC consistent with elevated RA pressure. Pericardium Trivial pericardial effusion, not hemodynamically significant. There is no pleural effusion noted on this exam.  Summary Statements  Electronically Signed by: Debbrah Rana, MD                                       10/13/2023, 3: 25 PM Ordering Physician: OSWALDO BRING Referring Physician: OSWALDO BRING Performed By: Norlene Barnie Don  Total discharge planning, communication/discussion and coordination of care today was 40  minutes.    ------------------------------------------------------------------------------- Attestation signed by Sheppard Specter, MD at 10/15/23 1436 Attending Attestation  [x]  I personally reviewed the laboratory studies           [x]  I reviewed 3 or more laboratory studies [x]  I personally reviewed the radiology image(s)           []  Independently visualized and interpreted the following studies: []  I personally reviewed the EKG/telemetry            []  Independently visualized and interpreted the EKG, comments:   []  I reviewed external records, consultant notes, and/or summaries.           []  I obtained/reviewed prior records (summary): []  I obtained history from family member, caregiver, or other. []  Case discussed with (specialty, name):    I have seen and examined the patient on 09/29   I reviewed the note written by the Resident/Medical Student and agree with the documentation of the current clinical condition and future plans for care, unless modified  by my comments below.   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 #Alcohol abuse #Microcytic anemia likely IDA #HTN #CKD  - CXR: pulm vasc congestion  - Last known EF 30-35%, echo Feb 2025 - Pro-BNP: 10, 682 - COPD and CHF pathway - IV diuresis with lasix  - A1C: 6.62, POC, SSI - Duo Nebs, ICS, IS, prednisone  - Azithromycin, [place]  - 2D echo showed LVEF of 20-25%, mod to severe TR - Patient would benefit from Life[facility] - CIWA protocol - Iron supp, Outpatient GI referral  - Medication compliance counseling  - Alcohol and smoking cessation counseling  - Cardiology rec GDMT, cont lasix , Life[facility], and outpatient follow up  Discharge home with lifevest and outpatient Cardiology follow up and GI referral.   Total discharge planning, communication/discussion and coordination of care today was greater than 35 minutes.      -------------------------------------------------------------------------------

## 2023-11-06 NOTE — H&P (Addendum)
 Internal Medicine History and Physical Note  Chief Complaint:  Chief Complaint  Patient presents with  . Shortness Of Breath      History of Presenting Illness:  Emma Hahn is a 73 y.o. female with PMHx of  HFrEF 20-25% (10/13/23), COPD, CKD 3b, Hepatitis C Infection, Tobacco Use, Alcohol Use Disorder, Anemia, Hypertension, and Hyperlipidemia, who presents with progressive worsening dyspnea, B/L lower extremity edema.  Patient reported to the ED with reports of gradual worsening of shortness of breath and fatigue over a period of months. She states that it is worse with exertion, but is also notable at rest. She reported to the ED today due to a friend of the family who expressed concerns for her well being [name] - [phone]). Patient endorses dyspnea and substernal chest pain with exacerbation on exertion, chronic fatigue, orthopnea (sleeps on couch), paroxysmal nocturnal dyspnea, gradual B/Lower leg swelling with skin scaling, and a intermittent productive cough with white phlegm (thick and thin) throughout the day and night.Patient denies wheezing, nausea, vomiting, abdominal pain, weight gain or loss dizziness or syncope. No recent sick contacts or illness. Additionally, she reported Right-sided breat swelling for an unknown period of time. She states that it is heavy, painless, and itches occasionally. She denies discharge from the area.  Patient reports that she has been in and out of the hospital for the same condition for a period of years and that she can't do activities of daily living like others expect her to; She expresses that she needs help and has mobility concerns as her walking is becoming painful and less stable lately.  She further shared that she has been noncompliant with her medication and expressed I have them, I just don't feel like taking them.  She states that she purchases Albuterol  form the store or relatives that have them. Patient is a known smoker with a  31 pack year history, alcohol use disorder (usually daily consumption of liquor, reported last drink of EtOH 2 weeks ago)   Pertinent  PMHx: Patient recently hospitalized 10/12/2023-10/14/2023 for similar complaints. At that time she was treated for CHF exacerbation. ECHO 10/13/2023: LVEF 20-25% with severe global hypokinesis of LV, Moderate LV dilation, Mild RV dilation, moderate MR, moderate to severe TR, RVSP 36 mmHg, dilated IVC, and trivial pericardial effusion. It was discussed with her at that time the need for ICD placement and she was discharged with a LifeVest, but reported not wearing it.   ED Course: Vitals were stable upon POA. Patient was afebrile with BP - 127/82 mmHg (97), HR - 108, RR - 24, SpO2 - 90% on room air. Her EKG demonstrated sinus tachycardia with occasional PVC's and CXR - demonstrated cardiomegaly with pulmonary congestion and mild pulmonary edema. Labs were significant for NT-proBNP - 12, 986 and down trending troponin 22< 25. Patient received a course of Lasix  40 mg for diuresis and supportive oxygen therapy of 2L [place]. Patient admitted for Acute Hypoxic Respiratory Failure 2/2 CHF exacerbation with underlying COPD.    Review of System:  Review of Systems  Constitutional:  Positive for chills. Negative for fever.  Respiratory:  Positive for cough, sputum production and shortness of breath. Negative for hemoptysis and wheezing.        Labored Breathing (Difficult to speak) White Mucus (Thick/Thin)  Cardiovascular:  Positive for orthopnea and leg swelling. Negative for chest pain and palpitations.       B/L Leg Swelling  (+) Pitting Edema  Gastrointestinal:  Negative for  constipation, diarrhea, nausea and vomiting.  Genitourinary:  Negative for dysuria, frequency and urgency.  Skin:        R Breast Swollen - with dimpling and itching No discharge   Neurological:  Negative for dizziness, tremors, weakness and headaches.  Psychiatric/Behavioral:  Negative for suicidal  ideas. The patient is nervous/anxious.       Past Medical and Surgical Histories:  Emma Hahn has a past medical history of HFrEF 20-25% (heart failure with reduced ejection fraction) (CMS/HHS-HCC), Chronic Obstructive Pulmonary Disease (COPD), HLD (hyperlipidemia), and HTN (hypertension), Chronic Kidney Disease 3b, Hepatitis C  Infection, Alcohol Use Disorder, Tobacco Use,   She  has no past surgical history on file.    Allergies / ADRs:  She has no known allergies.    Family History:  Her family history is not on file.    Social History:  She reports that she has been smoking cigarettes. She started smoking about 62 years ago. She has a 31.4 pack-year smoking history. She has never used smokeless tobacco. She reports current alcohol use of about 21.0 standard drinks of alcohol per week. She reports that she does not use drugs. reports a 31 pack year history for tobacco use (currently smokes 5 cigarettes every few days), and daily alcohol use for 40+ years (last know drink - one week ago). No recreational drug use.    Home Medications:  Home Medications  Medication Sig  albuterol  108 (90 BASE) mcg/act IN aerosol solution Take 2 puffs by inhalation every 6 hours as needed. Patient taking differently: Take 2 puffs by inhalation every 6 hours as needed for Wheezing.  atorvastatin  (LIPITOR) 40 MG tablet Take 40 mg by mouth. Patient not taking: Reported on 11/06/2023  empagliflozin  (JARDIANCE ) 10 MG TABS tablet Take 1 tablet by mouth daily. Patient not taking: Reported on 11/06/2023  furosemide  (LASIX ) 40 MG tablet Take 1 tablet by mouth daily. Patient not taking: Reported on 11/06/2023  metoprolol  succinate (TOPROL  XL) 25 MG tablet extended release 24 HR Take 1 tablet by mouth daily. Patient not taking: Reported on 11/06/2023  nicotine (NICODERM CQ) 21 MG/24HR patch Place 1 patch onto the skin daily for 30 days. Patient not taking: Reported on 11/06/2023  sacubitril -valsartan   (ENTRESTO ) 24-26 MG tablet Take 1 tablet by mouth 2 times daily. Patient not taking: Reported on 11/06/2023  spironolactone  (ALDACTONE ) 25 MG tablet Take 1 tablet by mouth daily. Patient not taking: Reported on 11/06/2023     Current Medications:   Current Facility-Administered Medications:  .  acetaminophen  (TYLENOL ) tablet 650 mg, 650 mg, Oral, Q6H PRN **OR** acetaminophen  (TYLENOL ) suppository 650 mg, 650 mg, Rectal, Q6H PRN, Tasha Renee Phillips-Wilson, MD .  acetaminophen  (TYLENOL ) tablet 650 mg, 650 mg, Oral, Q6H PRN, 650 mg at 11/06/23 2202 **OR** acetaminophen  (TYLENOL ) suppository 650 mg, 650 mg, Rectal, Q6H PRN, Tasha Renee Phillips-Wilson, MD .  albuterol  2.5 mg/3mL nebulizer solution 2.5 mg, 2.5 mg, Inhalation, Q4H PRN, Tasha Renee Phillips-Wilson, MD .  albuterol -ipratropium (DUO-NEB) nebulizer solution 3 mL, 3 mL, Inhalation, Q6H PRN, Tasha Renee Phillips-Wilson, MD .  atorvastatin  (LIPITOR) tablet 40 mg, 40 mg, Oral, nightly, Tasha Renee Phillips-Wilson, MD, 40 mg at 11/06/23 2158 .  empagliflozin  (JARDIANCE ) tablet 10 mg, 10 mg, Oral, 1X daily, Tasha Renee Phillips-Wilson, MD, 10 mg at 11/06/23 2158 .  ferrous sulfate tablet 325 mg, 325 mg, Oral, every other day, May Thu Thu Maw, MBBS .  folic acid  tablet 1 mg, 1 mg, Oral, 1X daily, May Thu Thu Maw,  MBBS .  furosemide  (LASIX ) injection 40 mg, 40 mg, Intravenous, 1X daily, Tasha Renee Phillips-Wilson, MD .  heparin  5000 units/mL injection 5,000 Units, 5,000 Units, Subcutaneous, 3X daily, Tasha Renee Phillips-Wilson, MD, 5,000 Units at 11/06/23 2158 .  metoprolol  succinate (TOPROL -XL) XL tablet 12.5 mg, 12.5 mg, Oral, 1X daily, Tasha Renee Phillips-Wilson, MD .  nicotine (NICODERM CQ) 21 MG/24HR patch 1 patch, 1 patch, Transdermal, 1X daily, Tasha Renee Phillips-Wilson, MD, 1 patch at 11/06/23 2158 .  ondansetron  (ZOFRAN  ODT) disintegrating tablet 4 mg, 4 mg, Oral, Q6H PRN, Tasha Renee Phillips-Wilson, MD .  oxyCODONE (ROXICODONE)  immediate release tablet 5 mg, 5 mg, Oral, Q4H PRN, Tasha Renee Phillips-Wilson, MD, 5 mg at 11/06/23 2202 .  polyethylene glycol (MIRALAX ) packet 17 g, 17 g, Oral, 1X daily PRN, Tasha Renee Phillips-Wilson, MD .  sacubitril -valsartan  (ENTRESTO ) 24-26 MG per tablet 1 tablet, 1 tablet, Oral, 2X daily, Tasha Renee Phillips-Wilson, MD, 1 tablet at 11/06/23 2158 .  thiamine  (VITAMIN B-1) tablet 100 mg, 100 mg, Oral, 1X daily, May Thu Thu Maw, MBBS  Current Outpatient Medications:  .  albuterol  108 (90 BASE) mcg/act IN aerosol solution, Take 2 puffs by inhalation every 6 hours as needed. (Patient taking differently: Take 2 puffs by inhalation every 6 hours as needed for Wheezing.), Disp: , Rfl:  .  atorvastatin  (LIPITOR) 40 MG tablet, Take 40 mg by mouth. (Patient not taking: Reported on 11/06/2023), Disp: , Rfl:  .  empagliflozin  (JARDIANCE ) 10 MG TABS tablet, Take 1 tablet by mouth daily. (Patient not taking: Reported on 11/06/2023), Disp: 90 tablet, Rfl: 1 .  furosemide  (LASIX ) 40 MG tablet, Take 1 tablet by mouth daily. (Patient not taking: Reported on 11/06/2023), Disp: 90 tablet, Rfl: 1 .  metoprolol  succinate (TOPROL  XL) 25 MG tablet extended release 24 HR, Take 1 tablet by mouth daily. (Patient not taking: Reported on 11/06/2023), Disp: 90 tablet, Rfl: 1 .  nicotine (NICODERM CQ) 21 MG/24HR patch, Place 1 patch onto the skin daily for 30 days. (Patient not taking: Reported on 11/06/2023), Disp: 30 patch, Rfl: 0 .  sacubitril -valsartan  (ENTRESTO ) 24-26 MG tablet, Take 1 tablet by mouth 2 times daily. (Patient not taking: Reported on 11/06/2023), Disp: 180 tablet, Rfl: 1 .  spironolactone  (ALDACTONE ) 25 MG tablet, Take 1 tablet by mouth daily. (Patient not taking: Reported on 11/06/2023), Disp: 90 tablet, Rfl: 1   Physical Exam: BP (!) 113/95   Pulse 96   Temp 36.4 C (97.6 F) (Oral)   Resp 17   SpO2 100%   Physical Exam Vitals and nursing note reviewed.  Constitutional:      General: She  is in acute distress.     Appearance: She is ill-appearing. She is not toxic-appearing or diaphoretic.  Cardiovascular:     Rate and Rhythm: Tachycardia present.     Pulses: Normal pulses.     Heart sounds: Normal heart sounds. No murmur heard.    No gallop.  Pulmonary:     Effort: Tachypnea, accessory muscle usage and respiratory distress present.     Breath sounds: No decreased breath sounds, wheezing, rhonchi or rales.  Chest:     Chest wall: No tenderness.     Comments: R sided breast enlargement; painless Musculoskeletal:     Right lower leg: Tenderness present. Edema present.     Left lower leg: Tenderness present. Edema present.     Comments: (+3) B/L Pedal Edema w/ scaling  Skin:    General: Skin is warm.  Capillary Refill: Capillary refill takes more than 3 seconds.     Coloration: Skin is not pale.     Findings: No ecchymosis, erythema or rash.  Neurological:     Mental Status: She is alert and oriented to person, place, and time.  Psychiatric:        Mood and Affect: Mood is anxious.        Behavior: Behavior normal.      Intake/Output Summary (Last 24 hours) at 11/07/2023 0229 Last data filed at 11/06/2023 2254 Gross per 24 hour  Intake 50 ml  Output --  Net 50 ml    Fingersticks:    Laboratory Data: WBC 8.0, Hg 9.8*, Plt 281 Na 135*, K 4.7, Cl 103, HCO3 18*, BUN 15, Cr 1.3*, GLUCOSE 177* Ca 9.0, Mg 1.8, Phos   Lactate  , Troponin  , INR   ABG:  / /  Troponin: , BNP:  Microbiology Data: Blood:  Urine:  Sputum:  SARS-CoV-2:     Imaging Review:  EKG  Result Date: (11/06/2023) Sinus Tachycardia with occasional PVCs Left Atrial Enlargement    XR Chest AP (Portable) Result Date: 11/06/2023 STUDY: XR CHEST AP (PORTABLE) INDICATION:   shortness of breath COMPARISON:  Chest radiograph performed 10/12/2023 FINDINGS: Similar cardiomegaly and prominence of the pulmonary vascular and interstitial markings. There is blurring of the right  costophrenic angle, which suggested a small right pleural effusion. No pneumothorax.   IMPRESSION: Cardiomegaly and pulmonary vascular congestion/pulmonary edema.      VTE Prophylaxis (720h ago, onward)     Start     Ordered   11/06/23 2200  heparin  5000 units/mL injection 5,000 Units  (Patient is Moderate to High Risk for VTE)  3 times daily        11/06/23 2121           GI Prophylaxis (720h ago, onward)    None        Assessment and Plan:  EMY ANGEVINE is a 73 y.o. female with PMHx of  HFrEF 20-25% (10/13/23), COPD, CKD 3b, Hepatitis C Infection, Tobacco Use, Alcohol Use Disorder, Anemia, Hypertension, and Hyperlipidemia, who presents with progressive worsening dyspnea, B/L lower extremity edema. Currently patient's condition is stable.  #Acute Hypoxic Respiratory Failure, likely  2/2 CHF exacerbation with underlying COPD #Acute on Chronic CHF Exacerbation (CHF) #HFrEF 20-25% - Patient presented with worsening shortness of breath (labored breathing) and moderate leg swelling. She endorses consistent rehospitalization's for the same symptoms - reoccurrences are likely due to medication noncompliance and dietary indiscretion. NT-proBNP - 12, 986, Troponin - 22< 25. CXR - Cardiomegaly with pulmonary vascular congestion/pulmonary edema. EKG - Sinus Tachycardia with occasional PVCs. Plan: CHF Pathway  Cardiac Monitoring Diuresis; Strict Monitoring of Intake and Output Q 4 hrs (Goal: - ) IV Furosemide  (LASIX ) 40 mg Daily  Sodium Restriction 2 g Low Sodium Diet; Fluid restriction 1500 mL Weigh on Scale, Daily Start GDMT (ARNI, B-Blocker, MRA, SGLT2i) Sacubitril -Valsartan  (ENTRESTO ) 24-26 daily  Metoprolol  Succinate (TOPROL  XL) 12.5 mg daily HOLD Spironolactone  25 mg daily (BP precaution - on soft side) Empagliflozin  (Jardiance ) 10 mg daily Monitor renal function and electrolytes Consider Cardiology Consult at am, if no improvement Fall Precautions- Mobility  Concerns PT/OT Consultation Palliative Consultation for advanced care planning as the patient stated she does not want life saving medication in the event of medical emergency; she agreed with current Rx though. Current code status is MOLST A2.  #Chronic Obstructive Pulmonary Disease (COPD) - No current exacerbation or evidence  of infection; WBC - 8.0. Worsening symptoms likely due to medication noncompliance. Plan: Albuterol - Ipatropium (DUONEB) 3 mL Q 6 hrs  Albuterol  2.5 mg /3 ml Nebulizer Q 4 hrs PRN Pulse Oximetry, Continuous Supplemental O2 via nasal cannula to maintain O2 > 92% Incentive Spirometry (Goal: 140 mL daily) Reinforce medication adherence and low sodium diet as lifestyle management Provide Smoking Cessation information; encourage lifestyle modification  #Anemia - Hypochromic Microcytic Anemia, likely IDA - MCV/RDW - 73.5/19.3; Capillary refill > 3 sec Patient reports fatigue; denies dizziness. Notably, Anemia work up in Sept 2025 showed IDA features. Plan: Monitor H&H, Transfuse if Hgb <8  Iron supplementation Ferrous Sulfate 325 mg every other day Outpatient follow-up and care   #Hypertension #Hyperlipidemia - BP on Admission - WNL; Last Lipid Panel - WNL. Plan: Metoprolol  Succinate (TOPROL  XL) 12.5 mg daily Sacubitril -Valsartan  (ENTRESTO ) 24-26 daily  HOLD Spironolactone  25 mg daily (BP precaution - on soft side) Atorvastatin  (LIPITOR) 40 mg nightly  #CKD 3b - Cr 1.3; eGFR- 43, likely volume depletion or cardiorenal Plan: Monitor UO Monitor kidney function  Avoid nephrotoxic meds  #Elevated Bilirubin - Total Bilirubin 1.6, Likely ?Congestive Hepatopathology; ALT/AST and ALP are WNL. Plan: Monitor LFTs Avoid hepatotoxic meds  #Tobacco Use Disorder - Patient has a 31 pack year history. Plan: Nicotine (NICODERM CQ) 1 patch daily  Encourage Smoking Cessation  #Hx of Alcohol Use Disorder (AUD) - Patient has a typical daily consumption of liquor,  last reported drink of EtOH was 2 weeks ago.  CIWA score 1 at POA. Plan: CIWA assessment Q 4 hrs Folic Acid  and Thiamine  supplementation Folic Acid  1 mg Daily Thiamine  (Vitamin B1) 100 mg daily Encourage Alcohol Cessation  #HCV infection - Diagnosed 10/13/2023 with HCV Ab - Reactive; HCV viral load - 7,220,000. Recommendation was made for patient to follow up with ID, but has not seen the ID specialist yet. Plan: Recheck HCV viral loads Outpatient follow up with ID for further Rx  #Right Sided Breast Enlargement - Patient reports a swollen and heavy R-sided breast; painless. Last known mammogram 07/07/2023.  Digital Screening B/L Mammogram w/ Tomosynthesis and CAD: BIRADS 1 - Scattered areas of fibroglandular density with no findings suspicious for malignancy.  Plan: Referral for Outpatient Mammogram   #Supportive Care - In place for patient safety, comfort and improved work flow. Plan: 2g Sodium Diet and 1500ml fluid restriction Heparin  5000units TID; DVT prophylaxis Acetaminophen  (TYLENOL ) 650 Q 6 hrs PRN; mild pain Oxycodone (ROXICODONE) 5 mg Q 4 hrs PRN; moderate or severe pain Odansetron (ZOFRAN ) 4 g Q 6 hrs PRN; nausea or vomiting Polyethylene Glycol (MIRALAX ) 17 g PRN; constipation  Discharge Planning Notes: Patient is interested in assisted living facility and acute level of care - to be discussed with case management. She is also interested in establishing care with the [facility] for follow up care and health maintenance (Right Sided Breast Painless Enlargement/ Microcytic Anemia).  Disposition: Med-Surg With Telemetry, IM Team C  Code Status: DNR/DNI Orders Placed This Encounter     Code Status Do NOT Intubate, No CPR (MOLST A2)   Daily checklist: Code status: MOLST A2 Diet: 2 gm Sodium Diet; Thin (L0); 1500 mL  Lines / drains / catheters: PIV VTE prophylaxis: Heparin  Planned procedures: None NOK / family contact:  Charles/Karen/Shonda Marks (children) Discharge disposition: Ongoing Medical Management   Lorenza Charlies Bushy, MD Electronically signed on 11/07/23.         ------------------------------------------------------------------------------- Attestation signed by Willma Candia Gobble  Mickey., MD at 11/07/23 1116 Attending Attestation:   [x]  I have reviewed:  [x]  Laboratory Data        [x]  ECG                [x]  Imaging:                          []  Other (specify):                 []  I have independently visualized and interpreted the following studies and my comments are:   ECG:                                                                      Imaging:                                                        Other (specify):                                                          [x]  I have obtained/reviewed old records and my summary is as follows:                              []  I have obtained additional medical history from, specify (name):                          []  I have discussed this case with consultant, specify (name):                            I have seen and examined the patient on 11/06/23  I reviewed the note written by the Fellow/Resident and agree with the documentation of the current clinical condition and future plans for care, unless modified  by my comments below.  PROBLEMS <redacted file path> Principal Problem:   Acute on chronic combined systolic and diastolic congestive heart failure (CMS/HHS-HCC)   Date Noted: 11/06/2023 Active Problems:   COPD (chronic obstructive pulmonary disease) (CMS/HHS-HCC) (Chronic)   Date Noted: 10/13/2023   Hepatitis C virus infection (Chronic)   Date Noted: 11/06/2023   Smoking greater than 40 pack years (Chronic)   Date Noted: 11/06/2023   Chronic kidney disease, stage 3b (CMS/HHS-HCC)   Date Noted: 11/06/2023        Additional Comments:  Start time: 2030 End time: 2050 I spent 20 minutes discussing Advance  Care Planning.The patient fully consented to the visit and discussion. CC: Advance care planning This patient is a 73year old female with a PMH of COPD who presented to the hospital with edema/shortness of breath Patient Active Problem List  Diagnosis Code  . Acute on chronic congestive heart failure, unspecified heart failure type (CMS/HHS-HCC) I50.9  .  HTN (hypertension) I10  . HFrEF (heart failure with reduced ejection fraction) (CMS/HHS-HCC) I50.20  . HLD (hyperlipidemia) E78.5  . COPD (chronic obstructive pulmonary disease) (CMS/HHS-HCC) J44.9  . Alcohol use disorder F10.90  . Acute on chronic combined systolic and diastolic congestive heart failure (CMS/HHS-HCC) I50.43  . Hepatitis C virus infection B19.20  . Smoking greater than 40 pack years F17.210  . Chronic kidney disease, stage 3b (CMS/HHS-HCC) N18.32   I met with the patient regarding Advance Care Planning including explanation and importance of having written advance directives, discussion of the patient's preferences for future care and importance of discussing their preferences with their family/surrogate. Resuscitation Code Status was discussed and in the event the patient were to code due to accident, illness, or other cause, patient states desire for full code. Patient had an opportunity to ask questions and did so with answers provided to their comprehension and satisfaction.     QUALITY MEASURES/DIAGNOSES/CONDITIONS:   Electronically signed by: Willma Candia Charlann Mickey., MD  11/07/2023 10:17 AM   -------------------------------------------------------------------------------

## 2023-11-07 NOTE — Consults (Addendum)
 *  Initial Palliative Medicine Consultation:    ALLERGIES: No Known Allergies ISOLATION:  CODE STATUS: MOLST A2 LOS: 1 [facility] 28   Treatment / Referral Team / Admission  Attending Provider: Willma Candia Charlann Mickey., MD     Primary Service: @HSERVICE @  Requesting Physician / Team:  Willma Candia Charlann Mickey., MD    Admission Date: 11/06/2023                       Primary Care Provider: LENNOX LORA Date/Time of Service:  11/07/2023    Impression/Plan of Care:  Emma Hahn is a 73 y.o. female presents to the hospital with shortness of breath she has underlying history of HFrEF 20 to 25%, COPD.  The Palliative Medicine Service has been consulted for advance care planning and complex medical decision making  RECOMMENDATIONS: Change code status to DNR-B Continue optimization per primary team.   PALLIATIVE IMPRESSION: Shortness of breath improved. HFrEF. COPD.  DISCUSSION: Advance Care Planning / Serious Illness Conversation / Family Meeting Emma N. Zama, MD 11/07/2023  Topic of today's advanced care planning: This note documents a goals of care and advanced care planning discussion. The purpose was to explore Emma Hahn's values, priorities, and preferences for future medical care in the context of her chronic health conditions.  Patient's understanding of their current health status: Emma Hahn understands that she was recently admitted to the emergency room due to congestive heart failure, which she states has an ejection fraction of 20-25% based on an echo from 2023/10/13. She also acknowledges her diagnoses of COPD and gastroesophageal reflux disease. She reports that she presented to the hospital on Monday or Tuesday with symptoms including significant bilateral lower extremity swelling, which she describes as the worst she has experienced, and shortness of breath that makes activities like getting dressed difficult.  Helpful information for future health  scenarios: Emma Hahn was receptive to discussing her health and future care preferences throughout the conversation.  Patient reflection and any emotions / concerns that arise: During the discussion, Emma Hahn expressed a stoic and fatalistic perspective toward her future health. She stated her philosophy is to accept what cannot be changed, summarizing this with the phrase whatever happens, happens. She reports using this mindset to manage worry.  Discussion Questions: - If your health was to get worse, what are your most important goals? Her primary goal is to have a Do-Not-Resuscitate (DNR) order in place. She specified that if her heart stops, she does not want any revival efforts. - What are your biggest worries? She states that she has no worries, explaining that she has accepted she cannot change certain things. - What gives you strength as you think about the future? Her strength comes from a sense of acceptance, with her outlook being whatever happens, happens. - What activities bring joy and meaning to your life? She finds joy in playing games and watching television, including shows like Mad love. - If your illness was to get worse, how much would you be willing to go through for the possibility of more time? She expressed a willingness to be more compliant with taking her prescribed medications, acknowledging that she is not always adherent. - How much do the people closest to you know about your priorities and wishes your for your care? She confirmed that her daughter is aware of her priorities and wishes for her care. - Having talked about all of this, what are your hopes for your health? Her  hope is to get better, which she defines as a reduction in her leg swelling and pain.  Summary of patient's response: What matters most to Emma Hahn is avoiding aggressive life-sustaining measures, specifically resuscitation. She values comfort and quality of life over  extending life at all costs. She prefers to be in a care facility where her needs for meals and health monitoring can be met, as she feels she is a burden on her family and lacks the energy for self-care. She has a DNR preference but is uncertain about her wishes regarding a feeding tube or dialysis, preferring to defer those decisions. She is willing to receive blood transfusions and intravenous antibiotics if needed. She also does not want non-invasive ventilation like BiPAP/CPAP if her breathing worsens, citing a past experience that caused her to panic, but is amenable to a nasal cannula or a simple face mask for oxygen delivery.  Care plan: - A Do-Not-Resuscitate (DNR) order will be placed in her chart to reflect her stated wishes. - A discussion will be initiated with the hospital case management team regarding her preference for placement in a skilled nursing or assisted living facility upon discharge. She feels unable to manage her own meal preparation and health needs while living with her daughter. - Follow-up with me as her palliative care physician will be arranged post-discharge to continue goals of care discussions, particularly regarding dialysis and feeding tubes, and to manage symptoms. - Lifestyle modifications were recommended and discussed: Diet: Advised to adopt a predominantly plant-based diet, including vegetables, legumes, and fruits, while avoiding sugary drinks, processed foods, and meats. She may have a Lipton green tea or pineapple soda occasionally. A glass of good-quality red wine with a meal is acceptable. Physical Activity: Encouraged to engage in at least 150 minutes of physical activity per week, such as using a stationary bike, even while seated. It was stressed that she should eat meals sitting in a chair rather than in bed to maintain muscle strength. Stress Management: Advised to continue using healthy coping mechanisms like listening to music and to explore other  techniques such as meditation. Substance Use: Counseled on the importance of avoiding harmful substances. We will work on smoking and alcohol cessation. A nicotine patch was started in the hospital, and a plan is in place to ensure she has access to it upon discharge to manage cravings. Sleep: Will work on improving sleep hygiene to achieve 7-8 hours of restful sleep per night, as she currently has difficulty sleeping. Social Connection: Encouraged to increase social interaction, as her current activities are solitary. A family meeting with her three children was offered to discuss care needs, but she declined.  I personally spent 39 minutes today in advance care planning, exclusive of procedures, providing care for this patient, including preparation, face to face time, EMR documentation and other services such as review of medical records, diagnostic results, patient education, counseling, coordination of care as specified in the encounter.  This note was generated by [facility] AI speech recognition and may contain inherent errors or omissions not intended by the user. Grammatical errors, random word insertions, deletions, pronoun errors, punctuation, spelling and incomplete sentences are occasional consequences of this technology due to software limitations. Not all errors are caught or corrected. If there are questions or concerns about the content of this note or information contained within the body of this dictation please contact me.   [] -The above recommendation discussed with primary team  The patient has the following medical  diagnoses:  Patient Active Problem List  Diagnosis Code  . Acute on chronic congestive heart failure, unspecified heart failure type (CMS/HHS-HCC) I50.9  . HTN (hypertension) I10  . HFrEF (heart failure with reduced ejection fraction) (CMS/HHS-HCC) I50.20  . HLD (hyperlipidemia) E78.5  . COPD (chronic obstructive pulmonary disease) (CMS/HHS-HCC) J44.9  . Alcohol  use disorder F10.90  . Acute on chronic combined systolic and diastolic congestive heart failure (CMS/HHS-HCC) I50.43  . Hepatitis C virus infection B19.20  . Smoking greater than 40 pack years F17.210  . Chronic kidney disease, stage 3b (CMS/HHS-HCC) N18.32   . Advanced Care Planning:  Does patient have Advance Directives:             []  Yes  []  No Does patient have capacity to make decisions: [x]  Yes  []  No  Code Status Discussed:                                    [x]  Yes  []  No  Surrogate Decision Maker: Mrs. Emma Hahn daughter [phone] Spiritual/Religious: [facility]  Patient is receiving Palliative care services: Yes  Discussed Case with:      []  - Pharmacist     []  - Chaplain Deward Lenon Lemming, [facility].    []  - Palliative Medicine Registered Nurse     []  - Palliative Medicine Social Worker     [] - Consultant      [] - Attending   [x] - Nursing     [] - Other    HPI  History Obtained from: Patient and chart review Limitations: None Medical Records Reviewed:    Reviewed current and past records in Epic     HPI: Emma Hahn is a 73 y.o. female admitted 11/06/2023   who presented to the emergency room on Monday or Tuesday of this week with worsening shortness of breath and significant bilateral lower extremity swelling. The swelling is described as the worst she has ever experienced, although it has somewhat improved since admission. The dyspnea has made activities of daily living, such as getting dressed, very difficult, taking up to an hour. She reports that even walking a short distance requires her to hold on to something to catch her breath. She denies any lack of appetite. .     [x]  -  Current and past Epic records                  []  -  Outside records via Care Everywhere                 []  -  Outside records via CRISP                               []  -  Outside records via paper records                        []  -  Prescription Drug Monitoring Program (PDMP)      Past Medical History  Past Medical / Surgical History:  has a past medical history of Alcohol use, COPD (chronic obstructive pulmonary disease) (CMS/HHS-HCC), HCV (hepatitis C virus), HFrEF (heart failure with reduced ejection fraction) (CMS/HHS-HCC), HLD (hyperlipidemia), and HTN (hypertension).  has no past surgical history on file. - Congestive heart failure with an ejection fraction of 20-25% (as of 2023/10/13) - Chronic Obstructive Pulmonary Disease (COPD) -  Chronic kidney disease - History of Hepatitis C, untreated - Hypertension - Hypercholesterolemia Family History: family history is not on file. Both parents are deceased.  She is one of eight siblings, with only three currently living.  Her father's sister had cancer. Her youngest daughter has a history of what is believed to be cervical cancer.  There is no known family history of heart disease, kidney disease, stroke, diabetes, or hypertension in her immediate family (parents, siblings, children). Social Hx:  reports that she has been smoking cigarettes. She started smoking about 62 years ago. She has a 31.4 pack-year smoking history. She has never used smokeless tobacco. She reports current alcohol use of about 21.0 standard drinks of alcohol per week. She reports that she does not use drugs. Tobacco Use: Started smoking at age 5. Currently trying to quit. Smokes approximately five cigarettes per day, purchased as singles. A nicotine patch was applied yesterday. Alcohol Use: Reports a desire for beer but has abstained for over a month as advised. Usual consumption prior to this was a 24 or 36-ounce can. Expresses that she still has cravings. 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 History of marijuana use before and after having her children. Denies current illicit drug use. Living Situation: She moved from [place]  to the [place] area in June of this year to be closer to her children. She  initially stayed with her youngest daughter but now resides with her oldest daughter in an apartment in [place]. She sleeps on the couch in the living room. The apartment is on the ground floor with no steps to enter. She states her daughter and roommate are often out, and she lacks the energy to prepare her own meals. Additional Social Hx:   She is divorced after a 43-year marriage. She has three living children, four grandchildren, and seven great-grandchildren.  She identifies as [employer] but does not attend church.      ECOG Performance Scale: 2 - Ambulatory and capable of all selfcare but unable to carry out any work activities. Up and about more than 50% of waking hours  Palliative Performance Scale:  70% - Reduced ambulation, Unable to perform normal job/work, full self care, normal or reduced intake, full consciousness  Home Medications:  Home Medications  Medication Sig  albuterol  108 (90 BASE) mcg/act IN aerosol solution Take 2 puffs by inhalation every 6 hours as needed. Patient taking differently: Take 2 puffs by inhalation every 6 hours as needed for Wheezing.  atorvastatin  (LIPITOR) 40 MG tablet Take 40 mg by mouth. Patient not taking: Reported on 11/06/2023  empagliflozin  (JARDIANCE ) 10 MG TABS tablet Take 1 tablet by mouth daily. Patient not taking: Reported on 11/06/2023  furosemide  (LASIX ) 40 MG tablet Take 1 tablet by mouth daily. Patient not taking: Reported on 11/06/2023  metoprolol  succinate (TOPROL  XL) 25 MG tablet extended release 24 HR Take 1 tablet by mouth daily. Patient not taking: Reported on 11/06/2023  nicotine (NICODERM CQ) 21 MG/24HR patch Place 1 patch onto the skin daily for 30 days. Patient not taking: Reported on 11/06/2023  sacubitril -valsartan  (ENTRESTO ) 24-26 MG tablet Take 1 tablet by mouth 2 times daily. Patient not taking: Reported on 11/06/2023  spironolactone  (ALDACTONE ) 25 MG tablet Take 1 tablet by mouth daily. Patient not taking:  Reported on 11/06/2023   Current Meds:  atorvastatin , 40 mg, nightly empagliflozin , 10 mg, 1X daily ferrous sulfate, 325 mg, every other day folic acid , 1 mg, 1X  daily furosemide , 40 mg, 1X daily heparin , 5,000 Units, 3X daily metoprolol  succinate, 12.5 mg, 1X daily nicotine, 1 patch, 1X daily sacubitril -valsartan , 1 tablet, 2X daily thiamine , 100 mg, 1X daily    acetaminophen , 650 mg, Q6H PRN  Or acetaminophen , 650 mg, Q6H PRN acetaminophen , 650 mg, Q6H PRN  Or acetaminophen , 650 mg, Q6H PRN albuterol , 2.5 mg, Q4H PRN albuterol -ipratropium, 3 mL, Q6H PRN ondansetron , 4 mg, Q6H PRN oxyCODONE, 5 mg, Q4H PRN polyethylene glycol, 17 g, 1X daily PRN     Review of Systems  Review of Systems  Constitutional:  Positive for activity change.    [place] Symptom Assessment Scale.    Physical Exam  Vital Signs: BP 117/72   Pulse 96   Temp 36.4 C (97.6 F) (Oral)   Resp 20   SpO2 96%  Encounter Vitals Stats (last 24 hours)     Vital Sign MIN AVG MAX   Temp 36.4 C (97.5 F) 36.4 C (97.53 F) 36.4 C (97.6 F)   Pulse 95 101.09* 109*   Resp 12 18.27 24*   BP: Systolic 105 119.17 132   BP: Diastolic 59 77.83 95*   Heart Rate (Monitored) 90 100.25* 109*   SpO2 90 % 97.73 % 100 %      Input/Output: Intake/Output Summary (Last 24 hours) at 11/07/2023 0753 Last data filed at 11/07/2023 9388 Gross per 24 hour  Intake 50 ml  Output 650 ml  Net -600 ml   Patient Vitals for the past 16 hrs:  Stool Occurrence  11/06/23 2300 1    Physical Exam Vitals and nursing note reviewed.  HENT:     Head: Normocephalic and atraumatic.  Cardiovascular:     Rate and Rhythm: Normal rate.  Musculoskeletal:     Cervical back: Normal range of motion and neck supple.  Neurological:     Mental Status: She is oriented to person, place, and time.     Motor: Weakness present.       Diagnostic Studies  Most recent lab test results:       11/07/23 0556 11/06/23 1551  WBC  8.6 8.0  HEMOGLOBIN 10.2* 9.8*  HEMATOCRIT 32.3* 32.2*  PLATELETS 279 281         11/07/23 0556 11/06/23 1551  SODIUM 136 135*  POTASSIUM 4.1 4.7  CHLORIDE 103 103  CO2 TOTAL 19* 18*  BUN 17 15  CREATININE 1.3* 1.3*  GLUCOSE BLD 124* 177*  CALCIUM  9.0 9.0  MAGNESIUM  2.2 1.8  PHOSPHORUS 3.4  --   AST 29 31  ALT 15 16  ALK PHOS 69 74  ALBUMIN 3.0* 3.3*  BILIRUBIN TOTAL 1.4* 1.6*         11/07/23 0556  PROTIME 15.9*  INR 1.2  PTT 33.0    Most recent imaging reports:  XR Chest AP (Portable) Result Date: 11/06/2023 IMPRESSION: Cardiomegaly and pulmonary vascular congestion/pulmonary edema.    Laboratory reports personally reviewed by me:  [x]  Yes  []  No Images personally reviewed by me:  []  Yes  [x]  No Imaging reports personally reviewed by me:  [x]  Yes  []  No (please note this is indicative of reviewing reports only)  ECG interpretation:  QTc:   Lab Results  Component Value Date   QTC Calculation (Bazett) 454 11/06/2023    Personally reviewed by me:  []  Yes  []  No   Echocardiogram report reviewed: [x]  Yes  []  No    Encounter time  Total consult time 75 minutes, including face-to-face and  non-face-to-face time spent on education, counseling, coordination of care and review of records,labs, images and documentation.   Medical Decision Making:  Problems addressed: High   Data Reviewed & Analyzed: Extensive        External notes reviewed from each unique source:  3+      Results reviewed from each unique test:  3+      Unique tests ordered:  0      Discussion of management or test interpretation with external healthcare provider   Risk of Complications from Diagnostic Testing & Treatment: Moderate   Medical decision making is High    Thank you for allowing me to participate in the care of your patient.   Please call with any questions or concerns :[facility] at [facility] Palliative Medicine Consult Service  Donley LOISE Evens, MD 11/07/2023 7:53 AM     Additional Time  Counseling and Education  []  Yes  []  No   (if yes type dot PALLCOUNSELINGEDUCATION below)    Family meeting  []  Yes  []  No   (if yes type dot PALLFAM below)    Advanced Care Planning  []  Yes  []  No   (if yes type dot PALLACP below)    Legacy work    []  Yes  []  No              (if yes type dot PALLLEGACY below)       Donley LOISE Evens, MD 11/07/2023 7:53 AM [facility]  Division of Palliative Medicine Available on [facility] at [facility] Palliative Medicine Consult Service    Portions of this chart may have been created with Dragon Voice Recognition Software . Occasional wrong-word or sound-alike substitutions may have occurred due to the inherent limitations of voice recognition software. Please read the chart carefully and recognize, using context, where these substitutions have occurred. While this note has been edited for accuracy, this software periodically misinterprets speech resulting in errors that might not have been caught during editing. If an unusual error is found in this record, please notify me to correct the error. *Some images could not be shown.

## 2023-11-07 NOTE — Discharge Summary (Addendum)
 Discharge Summary  ALLERGIES: No Known Allergies CODE STATUS: MOLST B  Name: Emma Hahn ([id number]) Age: 73 y.o. DOB: 01/14/1951 Room: 9E-102/9E-102-A Patient Primary Contact: [phone] (home) [phone] (work)  Primary Emergency Contact: Emma Hahn Date of Admission: 11/06/2023 Discharge Date: 11/09/2023 Attending Physician: Sherrilyn Kudo, MD Primary Care Provider: Bernida FALCON. 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   11/09/23 1102   11/07/23 0000  Discharge Diet       Question:  Diet recommendation:  Answer:  You should resume the home diet you had before you were hospitalized.   11/07/23 1622           Discharge Activity Order      Start     Ordered   11/07/23 0000  Discharge Activity       Question:  Activity level  Answer:  Resume normal activity level as tolerated   11/07/23 1622           Current inpatient wound care order  WOUND CARE DRESSING (720h ago, onward)    None       Weightbearing Status (restrictions below if applicable): There are no questions and answers to display.    Care Management, Home Health, DME Needs: Does The Patient Have A Lay Caregiver?: No Support Systems: Children, Family member(s) Important DME:        Pending Results:    Recommended Outpatient Testing:    Pertinent information for outpatient provider(s): none Discharge considerations:      Discharge Status and Follow Up  Condition: Good, Stable, Improved Disposition: Home Lay Caregiver identified by patient:  ,  daughter, Emma Hahn  Appointments The patient should make the following appointments as instructed:  Follow up appointments: (Call to confirm and/or arrange today.)     Donley LOISE Evens, MD. Schedule an appointment as soon as possible for a visit in 1 week(s).   Why: Follow-up in one to two weeks for Palliative medicine Contact information [address] [place] [address] [phone]         Bernida FALCON. Winfred, MD. Schedule an appointment as soon as possible for a visit on 11/14/2023.   Why: FOLLOW UP with Dr. Winfred in the clinic 10/30 Contact information [address] [place] [address] [phone]                Scheduled upcoming appointments: Your scheduled future appointments    Nov 14, 2023 1:35 [facility] Discharge with Kerney Raveling, MBBS [facility] Internal Medicine [place] [facility] [place]) [address] [place]  [id number] [phone]          Discharge Medications     Medication List     START taking these medications      Notes  atorvastatin  40 MG tablet Take 40 mg by mouth. Commonly known as: LIPITOR      benzonatate 100 MG capsule Take 1 capsule by mouth 3 times  daily for 7 days. Commonly known as: TESSALON      budesonide -formoterol  80-4.5 mcg/inh Aero inhaler Take 2 puffs by inhalation 2 times daily. Commonly known as: SYMBICORT      empagliflozin  10 MG Tabs tablet Take 1 tablet by mouth daily. Commonly known as: JARDIANCE       ferrous sulfate 325 (65 FE) MG tablet Take 1 tablet by mouth every other day. Start taking on: November 11, 2023      folic acid  1 MG tablet Take 1 tablet by mouth daily. Start taking on: November 10, 2023      furosemide  40 MG tablet Take 1 tablet by mouth daily. Commonly known as: LASIX       metoprolol  succinate 25  MG tablet extended release 24 HR Take 1 tablet by mouth daily. Commonly known as: TOPROL  XL      nicotine 21 MG/24HR patch Place 1 patch onto the skin daily for 30 days. Commonly known as: NICODERM CQ      oxyCODONE 5 mg immediate release tablet Take 1 tablet by mouth every 4 hours as needed for MODERATE Pain (or if patient requests it for severe pain) for up to 7 days. Commonly known as: ROXICODONE      predniSONE  20 MG tablet Take 2 tablets by mouth daily for 1 day. Start taking on: November 10, 2023      sacubitril -valsartan  24-26 MG tablet Take 1 tablet by mouth 2 times daily. Commonly known as: ENTRESTO       thiamine  100 MG Tabs tablet Take 1 tablet by mouth daily. Commonly known as: VITAMIN B-1 Start taking on: November 10, 2023          CONTINUE taking these medications      Notes  albuterol  108 (90 BASE) mcg/act aerosol solution Take 2 puffs by inhalation every 6 hours as needed for Wheezing. Commonly known as: VENTOLIN  HFA          STOP taking these medications    spironolactone  25 MG tablet Commonly known as: ALDACTONE            Your prescriptions may be filled at any pharmacy     These medications were sent to [id number] - [place] - [address]  [address] [place] [address]    Phone: [phone]  albuterol  108 (90 BASE) mcg/act aerosol solution benzonatate 100 MG capsule budesonide -formoterol  80-4.5 mcg/inh Aero inhaler ferrous sulfate 325 (65 FE) MG tablet folic acid  1 MG tablet oxyCODONE 5 mg immediate release tablet predniSONE  20 MG tablet thiamine  100 MG Tabs tablet      Hospital Course  #Acute hypoxic respiratory failure likely 2/2 HFrEF exarcerbation and possible COPD/CAP  #COPD exacerbation likely due to CAP Labs:NT-proBNP - 12, 986, Troponin - 22< 25. CXR - Cardiomegaly with pulmonary vascular congestion/pulmonary edema. 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 Patient did not want to discuss hep c with daughters, Plan: -Monitor LFTs -avoid hepatotoxic meds -RUQ US  pending reports - ciwa -CM on board for resources - discussed alcohol intake  * No surgery found * Consults: palliative  No notes on file  Treatment Team:  Treatment Team       Provider   Role Specialty    [facility], INTERNAL MEDICINE TEAM C     Team --         HPI  CC: Acute on chronic combined systolic and diastolic congestive heart failure (CMS/HHS-HCC) Reason for admission:  DAELYN PETTAWAY is a 73 y.o. female who presents with PMHx of HFrEF (LVEF 20-25% 10/13/2023), HTN, HLD, COPD (not on home O2), CKD, hepatitis C (viral load 7,220,000) and AUD who presents to the hospital for worsening shortness of breath and bilateral lower extremity edema of the past few days. The patient states that for the past 4 years she has dealt with shortness of breath and leg swelling. She has the medications at home to treat her CHF, but is noncompliant with these medications stating I just don't want to take them. The only medication she will use is an inhaler for her COPD which gives her some relief from her SOB. The past few days  she feels like her symptoms have worsened, so one of her friends brought her to the hospital to be evaluated. The SOB is exertional just from walking down the hallway and at rest. She sleeps on her couch at night elevated by the arm rest and 1 pillow. She sleeps on her side. Unable to lie flat do to SOB. She also has PND. There is mention of a productive cough with clear sputum. No hemoptysis. She complains of diffuse anterior chest pain with exertion, but resolves at rest. 31 pack year smoking history (1/2 ppd for total of 62 years). Hx AUD with last drink of EtOH 2 weeks ago. Typically drinks liquor. She is unable to determine when she started drinking, but states it has been her whole life. CIWA 1 (point for mildly anxious). She denies any recent weight gain/loss, fever, n/v/d, abdominal pain, palpitations, or headache.    Please see admission history and physical examination for additional details.   Problem List and History  Problem List: Principal Problem:   Acute on chronic combined systolic and diastolic congestive heart failure (CMS/HHS-HCC) Active Problems:   COPD (chronic obstructive pulmonary disease) (CMS/HHS-HCC)   Hepatitis C virus infection   Smoking greater than 40 pack years   Chronic kidney disease, stage 3b (CMS/HHS-HCC) Resolved Problems:   * No resolved hospital problems. *  Past Medical History:  Diagnosis Date  . Alcohol use   . COPD (chronic obstructive pulmonary disease) (CMS/HHS-HCC)   . HCV (hepatitis C virus)   . HFrEF (heart failure with reduced ejection fraction) (CMS/HHS-HCC)   . HLD (hyperlipidemia)   . HTN (hypertension)    Past Surgical History:  Procedure Laterality Date  . ANGIOPLASTY - CORONARY  12/08/2019  . HX HYSTERECTOMY       Medications at Admission  Prior to Admission Medications  Prescriptions Last Dose Informant Patient Reported? Taking?  albuterol  108 (90 BASE) mcg/act IN aerosol solution Taking Differently  Yes Yes  Sig: Take 2  puffs by inhalation every 6 hours as needed.  Patient taking differently: Take 2 puffs by inhalation every 6 hours as needed for Wheezing.  atorvastatin  (LIPITOR) 40 MG tablet Not Taking  Yes No  Sig: Take 40 mg by mouth.  Patient not taking: Reported on 11/06/2023  empagliflozin  (JARDIANCE ) 10 MG TABS tablet Not Taking  No No  Sig: Take 1 tablet by mouth daily.  Patient not taking: Reported on 11/06/2023  furosemide  (LASIX ) 40 MG tablet Not Taking  No No  Sig: Take 1 tablet by mouth daily.  Patient not taking: Reported on 11/06/2023  metoprolol  succinate (TOPROL  XL) 25 MG tablet extended release 24 HR Not Taking  No No  Sig: Take 1 tablet by mouth daily.  Patient not taking: Reported on 11/06/2023  nicotine (NICODERM CQ) 21 MG/24HR patch Not Taking  No No  Sig: Place 1 patch onto the skin daily for 30 days.  Patient not taking: Reported on 11/06/2023  sacubitril -valsartan  (ENTRESTO ) 24-26 MG tablet Not Taking  No No  Sig: Take 1 tablet by mouth 2 times daily.  Patient not taking: Reported on 11/06/2023  spironolactone  (ALDACTONE ) 25 MG tablet Not Taking  No No  Sig: Take 1 tablet by mouth daily.  Patient not taking: Reported on 11/06/2023    Facility-Administered Medications: None     Current Physical Exam  Temp:  [36 C (96.8 F)-36.6 C (97.9 F)] 36.4 C (97.5 F) Pulse:  [79-101] 91 Resp:  [18-19] 19 BP: (109-150)/(66-87) 115/68 MAP:  [80 MM HG-108 MM HG] 84 MM HG SpO2:  [96 %-100 %] 96 % Physical Exam Vitals reviewed.  Constitutional:      Appearance: She is well-developed.  HENT:     Head: Normocephalic.  Eyes:     Pupils: Pupils are equal, round, and reactive to light.  Cardiovascular:     Rate and Rhythm: Normal rate and regular rhythm.  Pulmonary:     Effort: Pulmonary effort is normal.     Breath sounds: Decreased breath sounds present.  Abdominal:     General: Bowel sounds are normal.     Palpations: Abdomen is soft.  Musculoskeletal:        General:  Normal range of motion.     Cervical back: Normal range of motion.     Left lower leg: Edema present.  Skin:    General: Skin is warm.     Capillary Refill: Capillary refill takes less than 2 seconds.  Neurological:     General: No focal deficit present.     Mental Status: She is alert and oriented to person, place, and time.  Psychiatric:        Mood and Affect: Mood normal.      Detailed Lab and Radiology Findings       11/09/23 0801  WBC 10.9*  HEMOGLOBIN 10.2*  HEMATOCRIT 33.4*  PLATELETS 251        11/09/23 0801 11/08/23 0603  SODIUM 132* 138  POTASSIUM 4.5 4.5  CHLORIDE 100 103  CO2 TOTAL 20* 22  BUN 21 15  CREATININE 1.2 1.3*  GLUCOSE BLD 218* 248*  CALCIUM  9.0 9.0  MAGNESIUM  1.9 2.0  PHOSPHORUS 3.7 3.5  AST  --  27  ALT  --  16  ALK PHOS  --  69  BILIRUBIN TOTAL  --  0.9        10/12/23 2125  HEMOGLOBIN A1C 6.62*   Microbiology Results     Procedure Component Value Units Date/Time   Sputum Culture w Gram Stain [[id number]] Collected: 11/07/23 1446  Order Status: Completed Lab Status: Preliminary result Updated: 11/09/23 1027   Specimen: Sputum, Expectorated     Gram Stain --    Moderate polymorphonuclear leukocytes Light Epithelial cells Abundant Mixed Bacterial Morphotypes     PRE --    The organisms isolated are consistent with normal upper respiratory flora or may  be colonizing the patient.    Narrative:     Includes Gram Stain Performing Laboratory: [facility], [address] [place], [place]  [address]      Transthoracic Echo (10/13/2023, 12: 28 PM)  Electronically Signed by: Debbrah Rana, MD                                       10/13/2023, 3: 25 PM Ordering Physician: OSWALDO BRING Referring Physician: OSWALDO BRING Performed By: Norlene Barnie Don   Last Image Reports  XR CHEST AP (PORTABLE) 11/06/2023  Impression :  Cardiomegaly and pulmonary vascular congestion/pulmonary  edema.     Total discharge planning, communication/discussion and coordination of care today was 35 minutes.   ------------------------------------------------------------------------------- Attestation signed by Sherrilyn Kudo, MD at 11/09/23 1816 I saw and evaluated the patient on the day of discharge, 11/09/23. I discussed the plan with the resident. I reviewed the documentation by the resident and agree with the following edits: 73 yo F admitted with DM2, acute hypoxic respiratory failure 2/2 HFrEF and COPD exacerbation. She was recently hospitalized in September for same and identified as HCV +.  Acute on chronic hypoxic respiratory failure - due to hf exacerbation + copd exacerbation Acute on chronic HFrEF: needs cards follow-up and adherence to diuretic as she gets winded using restroom- discussed importance of taking and also trialing a change in timing of medications to improve adherence. Continued on sacubitril -valsartan , furosemide  40mg  daily (40mg  IV used inpatient with -2L of UOP so decreased to 40mg  po daily on dc).  COPD exacerbation: started on prednisone  and nebs, O2 weaned. She needs compliance with symbicort outpatient.  HCV+: reports she had hepatitis x20 years - afp + us  ordered for screning, genotype + hcv rna. Does not want treatment currently, but we encouraged her to discuss once she gets new PCP  DM2: A1c 6.6, bg elevated in setting of steroids, not started on meds prior to dc due to issues with compliance.    I spent >35 minutes in the preparation of this discharge including medication reconciliation, direct patient care, discussion of the plan of care with the pt/pt family.  -------------------------------------------------------------------------------

## 2023-11-08 NOTE — Progress Notes (Signed)
 *   Follow Up Palliative Medicine Note:   ALLERGIES: No Known Allergies ISOLATION:  CODE STATUS: MOLST B LOS: 2 [facility] [id number]-[id number]   Treatment / Referral Team / Admission  Attending Provider: Sherrilyn Kudo, MD     Primary Service: @HSERVICE @  Admission Date: 11/06/2023                       Primary Care Provider: LENNOX LORA Date/Time of Service:  11/08/2023      Impression/Plan of Care  Emma Hahn is a 73 y.o. female presents to the hospital with shortness of breath she has underlying history of HFrEF 20 to 25%, COPD.   The Palliative Medicine Service has been consulted for advance care planning and complex medical decision making   RECOMMENDATIONS: Change code status to DNR-B Continue optimization per primary team.     PALLIATIVE IMPRESSION: Shortness of breath improved. HFrEF. COPD.   DISCUSSION: Advance Care Planning / Serious Illness Conversation / Family Meeting Emma N. Zama, MD 11/07/2023  Topic of today's advanced care planning: This note documents a goals of care and advanced care planning discussion. The purpose was to explore Emma Hahn's values, priorities, and preferences for future medical care in the context of her chronic health conditions.  Patient's understanding of their current health status: Emma Hahn understands that she was recently admitted to the emergency room due to congestive heart failure, which she states has an ejection fraction of 20-25% based on an echo from 2023/10/13. She also acknowledges her diagnoses of COPD and gastroesophageal reflux disease. She reports that she presented to the hospital on Monday or Tuesday with symptoms including significant bilateral lower extremity swelling, which she describes as the worst she has experienced, and shortness of breath that makes activities like getting dressed difficult.  Helpful information for future health scenarios: Emma Hahn was receptive to discussing her health and future care  preferences throughout the conversation.  Patient reflection and any emotions / concerns that arise: During the discussion, Emma Hahn expressed a stoic and fatalistic perspective toward her future health. She stated her philosophy is to accept what cannot be changed, summarizing this with the phrase whatever happens, happens. She reports using this mindset to manage worry.  Discussion Questions: - If your health was to get worse, what are your most important goals? Her primary goal is to have a Do-Not-Resuscitate (DNR) order in place. She specified that if her heart stops, she does not want any revival efforts. - What are your biggest worries? She states that she has no worries, explaining that she has accepted she cannot change certain things. - What gives you strength as you think about the future? Her strength comes from a sense of acceptance, with her outlook being whatever happens, happens. - What activities bring joy and meaning to your life? She finds joy in playing games and watching television, including shows like Mad love. - If your illness was to get worse, how much would you be willing to go through for the possibility of more time? She expressed a willingness to be more compliant with taking her prescribed medications, acknowledging that she is not always adherent. - How much do the people closest to you know about your priorities and wishes your for your care? She confirmed that her daughter is aware of her priorities and wishes for her care. - Having talked about all of this, what are your hopes for your health? Her hope is to get better, which she  defines as a reduction in her leg swelling and pain.  Summary of patient's response: What matters most to Emma Hahn is avoiding aggressive life-sustaining measures, specifically resuscitation. She values comfort and quality of life over extending life at all costs. She prefers to be in a care facility where her needs for  meals and health monitoring can be met, as she feels she is a burden on her family and lacks the energy for self-care. She has a DNR preference but is uncertain about her wishes regarding a feeding tube or dialysis, preferring to defer those decisions. She is willing to receive blood transfusions and intravenous antibiotics if needed. She also does not want non-invasive ventilation like BiPAP/CPAP if her breathing worsens, citing a past experience that caused her to panic, but is amenable to a nasal cannula or a simple face mask for oxygen delivery.  Care plan: - A Do-Not-Resuscitate (DNR) order will be placed in her chart to reflect her stated wishes. - A discussion will be initiated with the hospital case management team regarding her preference for placement in a skilled nursing or assisted living facility upon discharge. She feels unable to manage her own meal preparation and health needs while living with her daughter. - Follow-up with me as her palliative care physician will be arranged post-discharge to continue goals of care discussions, particularly regarding dialysis and feeding tubes, and to manage symptoms. - Lifestyle modifications were recommended and discussed: Diet: Advised to adopt a predominantly plant-based diet, including vegetables, legumes, and fruits, while avoiding sugary drinks, processed foods, and meats. She may have a Lipton green tea or pineapple soda occasionally. A glass of good-quality red wine with a meal is acceptable. Physical Activity: Encouraged to engage in at least 150 minutes of physical activity per week, such as using a stationary bike, even while seated. It was stressed that she should eat meals sitting in a chair rather than in bed to maintain muscle strength. Stress Management: Advised to continue using healthy coping mechanisms like listening to music and to explore other techniques such as meditation. Substance Use: Counseled on the importance of avoiding  harmful substances. We will work on smoking and alcohol cessation. A nicotine patch was started in the hospital, and a plan is in place to ensure she has access to it upon discharge to manage cravings. Sleep: Will work on improving sleep hygiene to achieve 7-8 hours of restful sleep per night, as she currently has difficulty sleeping. Social Connection: Encouraged to increase social interaction, as her current activities are solitary. A family meeting with her three children was offered to discuss care needs, but she declined.  Palliative care personally spent 39 minutes in advance care planning, exclusive of procedures, providing care for this patient, including preparation, face to face time, EMR documentation and other services such as review of medical records, diagnostic results, patient education, counseling, coordination of care as specified in the encounter.  This note was generated by [facility] AI speech recognition and may contain inherent errors or omissions not intended by the user. Grammatical errors, random word insertions, deletions, pronoun errors, punctuation, spelling and incomplete sentences are occasional consequences of this technology due to software limitations. Not all errors are caught or corrected. If there are questions or concerns about the content of this note or information contained within the body of this dictation please contact me.   [] -The above recommendation discussed with primary team   The patient has the following medical diagnoses:  Patient Active Problem List  Diagnosis Code  . Acute on chronic congestive heart failure, unspecified heart failure type (CMS/HHS-HCC) I50.9  . HTN (hypertension) I10  . HFrEF (heart failure with reduced ejection fraction) (CMS/HHS-HCC) I50.20  . HLD (hyperlipidemia) E78.5  . COPD (chronic obstructive pulmonary disease) (CMS/HHS-HCC) J44.9  . Alcohol use disorder F10.90  . Acute on chronic combined systolic and diastolic  congestive heart failure (CMS/HHS-HCC) I50.43  . Hepatitis C virus infection B19.20  . Smoking greater than 40 pack years F17.210  . Chronic kidney disease, stage 3b (CMS/HHS-HCC) N18.32    Delirium Precautions:    - Frequent reorientation by staff and maintaining appropriate day-night cycles - Maximize visibility of environmental cues such as clocks, calendars - Encourage communication and visits with family members and friends - Allow exposure to natural light during the day, opening curtains to window - Encourage day time activities, OOB and keeping pt active during day if possible  - Minimize disturbances and reduce light exposure and TV in the evening/night  - Limit non essential care such as blood draws, imaging, etc - Group blood draws together to limit frequency of needle sticks  - Optimize nutrition, bladder/bowel regimen. Constipation, urinary retention, dehydration may worsen delirium. Rule out infection such as UTI or PNA which can predispose patients to delirium. - Minimize medications that may cause/exacerbate delirium: opiates, anticholinergics, antihistamines, benzodiazepines, and other sedatives -Patient use bathrooms rather than bedpans and have meals at central location rather than in bed.  Advanced Care Planning:  Does patient have Advance Directives:             []  Yes  []  No Does patient have capacity to make decisions: [x]  Yes  []  No  Code Status Discussed:                                     [x]  Yes  []  No  Surrogate Decision Maker:   Mrs. Darice Norfolk daughter [phone]  Spiritual/Religious: Bertie   Discussed Case with:       []  - Pharmacist     []  - Chaplain Deward Lenon Lemming, [facility].    []  - Palliative Medicine Registered Nurse     []  - Palliative Medicine Social Worker     [] - Consultant      [] - Attending   [x] - Nursing     [] - Other    HPI:  Interval History: 11/08/2023 evaluated at bedside this morning acute distress, denies any headaches  chest pain abdominal pain reported her shortness of breath has improved.   Current Meds:  atorvastatin , 40 mg, nightly azithromycin, 500 mg, Q24H benzonatate, 100 mg, 3X daily cefTRIAXone, 1 g, Q24H empagliflozin , 10 mg, 1X daily ferrous sulfate, 325 mg, every other day folic acid , 1 mg, 1X daily furosemide , 40 mg, 1X daily heparin , 5,000 Units, 3X daily metoprolol  succinate, 12.5 mg, 1X daily nicotine, 1 patch, 1X daily predniSONE , 40 mg, 1X daily sacubitril -valsartan , 1 tablet, 2X daily thiamine , 100 mg, 1X daily    acetaminophen , 650 mg, Q6H PRN  Or acetaminophen , 650 mg, Q6H PRN albuterol , 2.5 mg, Q4H PRN albuterol -ipratropium, 3 mL, Q6H PRN ondansetron , 4 mg, Q6H PRN oxyCODONE, 5 mg, Q4H PRN polyethylene glycol, 17 g, 1X daily PRN    ECOG Performance Scale: 2 - Ambulatory and capable of all selfcare but unable to carry out any work activities. Up and about more than 50% of waking hours  Palliative Performance Scale:  70% -  Reduced ambulation, Unable to perform normal job/work, full self care, normal or reduced intake, full consciousness   Physical Exam  Vital Signs: BP 106/63   Pulse 99   Temp 36.7 C (98.1 F) (Oral)   Resp 17   Ht 162.6 cm (5' 4)   Wt 64.4 kg (142 lb)   SpO2 100%   BMI 24.37 kg/m  Encounter Vitals Stats (last 24 hours)     Vital Sign MIN AVG MAX   Temp 36.4 C (97.5 F) 36.7 C (98.13 F) 37.1 C (98.8 F)   Pulse 96 98.5 101*   Resp 10* 20.51 36*   BP: Systolic 103 114.94 124   BP: Diastolic 61 73 93*   Heart Rate (Monitored) 91 100.03* 111*   SpO2 91 % 96.34 % 100 %   Weight 64.4 kg (142 lb) 64.4 kg (142 lb) 64.4 kg (142 lb)      Input/Output: Intake/Output Summary (Last 24 hours) at 11/08/2023 0547 Last data filed at 11/07/2023 2021 Gross per 24 hour  Intake 100 ml  Output 2450 ml  Net -2350 ml    No data found.  Physical Exam Constitutional:      General: She is not in acute distress.    Appearance: She is not  ill-appearing.  Cardiovascular:     Rate and Rhythm: Normal rate and regular rhythm.  Pulmonary:     Effort: No respiratory distress.  Abdominal:     General: Bowel sounds are normal.  Musculoskeletal:     Right lower leg: No edema.     Left lower leg: No edema.  Skin:    Capillary Refill: Capillary refill takes less than 2 seconds.  Neurological:     Mental Status: She is alert and oriented to person, place, and time.  Psychiatric:        Mood and Affect: Mood normal.        Behavior: Behavior normal.        Thought Content: Thought content normal.      Diagnostic Studies  Most recent lab test results:       11/07/23 0556 11/06/23 1551  WBC 8.6 8.0  HEMOGLOBIN 10.2* 9.8*  HEMATOCRIT 32.3* 32.2*  PLATELETS 279 281         11/07/23 0556 11/06/23 1551  SODIUM 136 135*  POTASSIUM 4.1 4.7  CHLORIDE 103 103  CO2 TOTAL 19* 18*  BUN 17 15  CREATININE 1.3* 1.3*  GLUCOSE BLD 124* 177*  CALCIUM  9.0 9.0  MAGNESIUM  2.2 1.8  PHOSPHORUS 3.4  --   AST 29 31  ALT 15 16  ALK PHOS 69 74  ALBUMIN 3.0* 3.3*  BILIRUBIN TOTAL 1.4* 1.6*         11/07/23 0556  PROTIME 15.9*  INR 1.2  PTT 33.0    Most recent imaging reports:  XR Chest AP (Portable) Result Date: 11/06/2023 IMPRESSION: Cardiomegaly and pulmonary vascular congestion/pulmonary edema.    Laboratory reports personally reviewed by me:  []  Yes  []  No Images personally reviewed by me:  []  Yes  []  No Imaging reports personally reviewed by me:  []  Yes  []  No (please note this is indicative of reviewing reports only)  ECG interpretation:   QTc:   Lab Results  Component Value Date   QTC Calculation (Bazett) 454 11/06/2023    Personally reviewed by me:  []  Yes  []  No   Echocardiogram report reviewed: []  Yes  []  No     Care Time  Total  follow-up time 30 minutes, including face-to-face and non-face-to-face time spent on education, counseling, coordination of care and review of records, labs images and  documentation.   Medical Decision Making:  Problems addressed: High   Data Reviewed & Analyzed: Extensive        External notes reviewed from each unique source:  3+      Results reviewed from each unique test:  3+      Unique tests ordered:  3+      Discussion of management or test interpretation with external healthcare provider   Risk of Complications from Diagnostic Testing & Treatment: Moderate   Medical decision making is High    Thank you for allowing me to participate in the care of your patient. Please call with any questions or concerns :[facility] at [facility] Palliative Medicine Consult Service    Electronically Signed by:  Akintunde Abiodun  Gbadebo, MD 10/24/20255:47 AM    ATTEST INPATIENT COPYFORWARD  Patient seen and examined today.I have reviewed and attest to the accuracy of the copied/pasted information. There are no changes to the ROS/Exam, lab/imaging, impression or plan pulled from 11/07/23 except for those indicated 11/08/2023    Additional Time  Counseling and Education  []  Yes  []  No   (if yes type dot PALLCOUNSELINGEDUCATION below)    Family meeting  []  Yes  []  No   (if yes type dot PALLFAM below)    Advanced Care Planning  []  Yes  []  No   (if yes type dot PALLACP below)    Legacy work    []  Yes  []  No              (if yes type dot PALLLEGACY below)       Akintunde Abiodun  Gbadebo, MD 11/08/2023 5:47 AM [facility]  Division of Palliative Medicine Available on [facility] at [facility] Palliative Medicine Consult Service    Portions of this chart may have been created with Dragon Voice Recognition Software . Occasional wrong-word or sound-alike substitutions may have occurred due to the inherent limitations of voice recognition software. Please read the chart carefully and recognize, using context, where these substitutions have occurred. While this note has been edited for accuracy, this software periodically misinterprets speech resulting in  errors that might not have been caught during editing. If an unusual error is found in this record, please notify me to correct the error.   ------------------------------------------------------------------------------- Attestation signed by Donley LOISE Evens, MD at 11/08/23 1322 *  Mr/Mrs/Ms. Bahena was seen, examined and discussed with Dr. Lennox in detail under my supervision.  I participated in key elements of the history taking, reviewing of labs and imaging studies with the resident.  I agree with the above note as written or edited by me. I actively participated in the decision making process, assessment and plan. All 12 systems were reviewed and discussed and are negative unless noted in HPI. This service was performed in part by the resident under my direct supervision. LE edema better. Plan for outpatient HCV treatment. Encouraged out of bed as tolerated and not to eat in bed.    []   Family       [x]  Nursing       []  Case Management/Social Worker   []  Consultant                         []  PCP                        []   Attending   []  Other   Total time spent caring for patient:   [x]  >35 minutes []  >50 minutes  Medical Decision Making:  Problems addressed: High   Data Reviewed & Analyzed: Extensive        External notes reviewed from each unique source:  3+      Results reviewed from each unique test:  3+      Unique tests ordered: 0      Discussion of management or test interpretation with external healthcare provider   Risk of Complications from Diagnostic Testing & Treatment: Moderate   Medical decision making is High   including face-to-face and non-face-to-face time spent on education, counseling, coordination of care and review of records labs images and documentation.   Thank you for inviting us  to participate in the care of this patient. We will continue to follow with you.           Portions of this chart may have been created with Dragon Voice Recognition  Software . Occasional wrong-word or sound-alike substitutions may have occurred due to the inherent limitations of voice recognition software. Please read the chart carefully and recognize, using context, where these substitutions have occurred. While this note has been edited for accuracy, this software periodically misinterprets speech resulting in errors that might not have been caught during editing. If an unusual error is found in this record, please notify me to correct the error. ------------------------------------------------------------------------------- *Some images could not be shown.

## 2023-11-08 NOTE — Progress Notes (Signed)
 Attending Attestation  I have seen and examined the patient on 11/08/2023   I reviewed the note written by the Fellow/Resident and agree with the documentation of the current clinical condition and future plans for care, unless modified  by my comments below.  Additional Comments: overall feeling ok today, no chest pain, dyspnea improving and O2 requirement downtrending.   #Acute hypoxic respiratory failure 2/2 HFrEF exacerbation and possible COPD/pna - continue IV ceftriaxone - continue IV lasix  40mg   - wean O2 as able - continue GDMT (metoprolol , jardiance , entresto )  #hep C+ : pt did not want to discuss while daughters on phone, she was aware of diagnosis, plan to initiate on dc.Will check AFP and get RUQ us  due to elevated bili and possibility of AFP. #Polysubstance use: ciwa, cm for resources   []  I personally reviewed the laboratory studies           []  I reviewed 3 or more laboratory studies []  I reviewed consultant notes []  I reviewed prior records (summary): []  I obtained history from family member, caregiver, or other. []  Independently visualized and interpreted the following radiology studies, comments: []  Independently visualized and interpreted the EKG, comments: []  Case discussed with (specialty, name):      Total Time: 75 minutes, Time was spent: , Preparing to see the patient, Obtaining and/or reviewing separately obtained hisotry, Performing a medically appropriate examination and/or evaluation, Counseling and educating the patient/family/caregiver, Ordering medications, tests, or procedures, Referring and communicating with other health care professionals, Documenting clinical information in the electronic health record, Independently interpreting results (not separately reported) and communicating results to patient/family/caregiver, and Care coordination (not separately reported) Extensive discussionw ith both daughter and patient regarding illness,plan for  dc.    QUALITY MEASURES:   Electronically signed by: Sherrilyn Kudo, MD 11/08/2023 10:38 PM

## 2023-11-09 NOTE — Progress Notes (Signed)
 [facility] Daily Progress Note Patient: Emma Hahn, Date of admission: 11/06/2023   Interval History/Subjective: Patient reports SOB with exertion, SPO2 96% on RA. She was able to walked to the bathroom and walked to the chair, she is sitting comfortable.BP: 115/68 HR 91 Denies headache, chest pain, abdominal pain, N/V/D/C. Discussed and counseled about alcohol use  Objective: Vital signs over last 24 hours reviewed and significant for:  Encounter Vitals Stats (last 24 hours)     Vital Sign MIN AVG MAX   Temp 36 C (96.8 F) 36.3 C (97.25 F) 36.6 C (97.9 F)   Pulse 79 92.75 101*   Resp 18 18.5 19   BP: Systolic 109 122 849*   BP: Diastolic 66 73.75 87   SpO2 96 % 99 % 100 %       Intake/output over last 24 hours reviewed and significant for:  10/24 0700 - 10/25 0659 In: 800 (12.4 mL/kg)  Out: 2000 (31.1 mL/kg) [Urine:2000 (1.3 mL/kg/hr)] Net: -1200 Weight: 64.4 kg  (11/07/23 0918)  Notable physical exam findings: Physical Exam Vitals reviewed.  Constitutional:      Appearance: She is well-developed.  HENT:     Head: Normocephalic and atraumatic.  Eyes:     Pupils: Pupils are equal, round, and reactive to light.  Cardiovascular:     Rate and Rhythm: Normal rate and regular rhythm.  Pulmonary:     Effort: Pulmonary effort is normal.     Breath sounds: Decreased breath sounds and rales present.  Abdominal:     General: Bowel sounds are normal.  Musculoskeletal:        General: Normal range of motion.     Cervical back: Normal range of motion and neck supple.     Right lower leg: Edema (2+) present.     Left lower leg: Edema (2+) present.  Skin:    Capillary Refill: Capillary refill takes less than 2 seconds.  Neurological:     General: No focal deficit present.     Mental Status: She is alert and oriented to person, place, and time.  Psychiatric:        Mood and Affect: Mood normal.        Behavior: Behavior normal.     Labs and cultures  over last 24 hours were reviewed and significant for:  Hospital Encounter on 11/06/23 (from the past 24 hours)  Phosphorus   Collection Time: 11/09/23  8:01 AM  Result Value Ref Range   Phosphorus 3.7 2.7 - 4.5 mg/dL  Magnesium    Collection Time: 11/09/23  8:01 AM  Result Value Ref Range   Magnesium  1.9 1.7 - 2.6 mg/dL  CBC with Auto Diff   Collection Time: 11/09/23  8:01 AM  Result Value Ref Range   WBC 10.9 (H) 4.5 - 10.6 10*3/uL   RBC 4.58 3.80 - 5.10 10   Hemoglobin 10.2 (L) 12.0 - 14.0 g/dL   Hematocrit 66.5 (L) 58.9 - 47.0 %   MCV 72.9 (L) 80.0 - 99.0 fL   MCH 22.3 (L) 26.0 - 33.0 pg   MCHC 30.5 (L) 32.0 - 35.0 g/dL   Platelets 748 849 - 524 10*3/uL   RDW 19.5 (H) 10.0 - 13.0 %   Nucleated RBC % 0.0 %   Nucleated RBC Abs 0.00 0.00 - 0.00 10*3/uL  Basic Metabolic Panel   Collection Time: 11/09/23  8:01 AM  Result Value Ref Range   Sodium 132 (L) 136 - 145 mmol/L   Chloride  100 98 - 107 mmol/L   CO2 Total 20 (L) 22 - 29 mmol/L   Anion Gap 12 8 - 18 mmol/L   Glucose Bld 218 (H) 70 - 100 mg/dL   BUN 21 6 - 23 mg/dL   Creatinine 1.2 0.7 - 1.2 mg/dL   Calcium  9.0 8.6 - 10.2 mg/dL   Potassium 4.5 3.5 - 5.1 mmol/L  Automated Differential   Collection Time: 11/09/23  8:01 AM  Result Value Ref Range   Neutros% 79.0 (H) 35.0 - 70.0 %   Neutros Abs 8.58 (H) 1.80 - 7.90 10*3/uL   Lymphs % 9.2 (L) 22.0 - 44.0 %   Lymphs Abs 1.00 (L) 1.10 - 4.20 10*3/uL   Monos % 11.2 %   Monos Abs 1.22 (H) 0.00 - 1.00 10*3/uL   Eos % 0.0 0.0 - 6.0 %   Eosinophils Abs 0.00 0.00 - 0.50 10*3/uL   Basophil % 0.2 0.0 - 2.0 %   Basophil Abs 0.0 0.0 - 0.2 10*3/uL   Immature Grans % 0.4 0.0 - 5.0 %   Immature Grans (Abs) 0.04 0.00 - 0.53 10*3/uL  Estimated GFR   Collection Time: 11/09/23  8:01 AM  Result Value Ref Range   EGFR Result 48 (L) >=60    Imaging and Diagnostic studies over last 24 hours were reviewed and significant for:   Assessment:  MELAH EBLING is a 73 y.o.  F who was  admitted on 11/06/2023 for  now requiring ongoing hospitalization for CHF and COPD.  Currently their condition is stable.  Problem-based plans: #Acute hypoxic respiratory failure likely 2/2 HFrEF exarcerbation and possible COPD/CAP  #COPD exacerbation likely due to CAP Labs:NT-proBNP - 12, 986, Troponin - 22< 25. CXR - Cardiomegaly with pulmonary vascular congestion/pulmonary edema. 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 Patient did not want to discuss hep c with daughters, Plan: -Monitor LFTs -avoid hepatotoxic meds -RUQ US  pending reports - ciwa -CM on board for resources - discussed alcohol intake  Daily checklist: Lines / drains / catheters: PIV DVT ppx: Heparin  Planned procedures: NOK / family contact: Academic Librarian (Daughter)[phone] Discharge disposition: MED-SURG W TELE  Paola Eldonna Harriett Sanders, MD   ------------------------------------------------------------------------------- Attestation signed by Sherrilyn Kudo, MD at 11/09/23 1437 Attending Attestation  I have seen and examined the patient on 11/09/2023  I reviewed  the note written by the Fellow/Resident and agree with the documentation of the current clinical condition and future plans for care, unless modified  by my comments below.  Additional Comments: ok for dc home today will need close follow-up. Discussed smoking cessation and alcohol cessation x >5 minutes,  pt not ready to attempt MAUD and believes she can stop smoking on her own.   [x]  I personally reviewed the laboratory studies           [x]  I reviewed 3 or more laboratory studies [x]  I reviewed consultant notes []  I reviewed prior records (summary): []  I obtained history from family member, caregiver, or other. []  Independently visualized and interpreted the following radiology studies, comments: []  Independently visualized and interpreted the EKG, comments: []  Case discussed with (specialty, name):          QUALITY MEASURES:   Electronically signed by: Sherrilyn Kudo, MD 11/09/2023 2:36 PM   ------------------------------------------------------------------------------- *Some images could not be shown.

## 2023-11-09 NOTE — Care Plan (Signed)
  Problem: SAFETY: Refer to admission navigator, orders, or flowsheets for the assessment of the patient's fall risk or risk for violence and possible interventions ordered including to: falls, violence, etc. Goal: Patient and/or caregiver will implement strategies to increase safety and prevent falls in the hospital. Outcome: Met This Shift Goal: Other Goal (free text) Outcome: Met This Shift   Problem: RESPIRATORY: Refer to orders, flowsheets, notes for possible interventions including positioning, IS/Flutter Value, pulse ox, suctioning, oxygen, HOB elevation, CHG mouth care, SBT/TC trial, meds. Goal: Patient will remain free of any signs and symptoms of impaired gas exchange as evidenced by no decrease in oxygen saturation or increase in work of breathing. Outcome: Met This Shift Goal: Patient will remain free of any sign or symptoms of aspiration as evidenced by remaining afebrile and no increase in respiratory rate. Outcome: Met This Shift   Problem: CARDIAC: Refer to orders, flowsheets, progress notes for the interventions including, but not limited: mobilization/positioning, vital signs, rhythm assessment, cardiac assessment, and medications. Goal: Patient's blood pressure will remain within the ordered therapeutic range. Outcome: Met This Shift Goal: Patient will demonstrate a stable cardiac rhythm and rate within patient's ordered therapeutic range. Outcome: Met This Shift

## 2023-11-09 NOTE — Progress Notes (Signed)
 Progress Note  Overnight Events / Subjective Complaints: Patient is feeling well today, She denies headache, chest pain, abdominal pain, nausea, vomiting, diarrhea and constipation,  and her shortness of breath has improved with decreased oxygen requirements to before.  Current Medications:  atorvastatin , 40 mg, Oral, nightly azithromycin, 500 mg, Oral, Q24H benzonatate, 100 mg, Oral, 3X daily budesonide -formoterol , 2 puff, Inhalation, RT 2X daily empagliflozin , 10 mg, Oral, 1X daily ferrous sulfate, 325 mg, Oral, every other day folic acid , 1 mg, Oral, 1X daily furosemide , 40 mg, Intravenous, 1X daily heparin , 5,000 Units, Subcutaneous, 3X daily metoprolol  succinate, 12.5 mg, Oral, 1X daily nicotine, 1 patch, Transdermal, 1X daily predniSONE , 40 mg, Oral, 1X daily sacubitril -valsartan , 1 tablet, Oral, 2X daily thiamine , 100 mg, Oral, 1X daily   PRN:  acetaminophen  **OR** acetaminophen , albuterol , albuterol -ipratropium, ondansetron , oxyCODONE, polyethylene glycol  Infusions:    Physical Exam: BP 115/68   Pulse 91   Temp 36.4 C (97.5 F) (Oral)   Resp 19   Ht 162.6 cm (5' 4)   Wt 64.4 kg (142 lb)   SpO2 96%   BMI 24.37 kg/m   Gen: She is well developed HEENT: Normocephalic, Atraumatic Chest: Crackles, bilateral Heart: Regular Abd: Soft, non-distended, non-tender, normal active bowel sounds Ext:  2+ edema bilaterally. Capillary refill <2 seconds Neuro: Glasgow Coma Scale Score: 15 Oriented to person, place, and time   Intake/Output Summary (Last 24 hours) at 11/09/2023 1239 Last data filed at 11/09/2023 0848 Gross per 24 hour  Intake 540 ml  Output 1350 ml  Net -810 ml    Fingersticks:    Laboratory Data: WBC 10.9*, Hg 10.2*, Plt 251 Na 132*, K 4.5, Cl 100, HCO3 20*, BUN 21, Cr 1.2, GLUCOSE 218* Ca 9.0, Mg 1.9, Phos 3.7 Lactate  , Troponin  , INR   ABG:  / /  Microbiology Data: Blood  Urine  Sputum  SARS-CoV-2:         Imaging Review: US   Abdomen Limited (Gall Bladder) Result Date: 11/09/2023 Examination: Limited abdominal sonogram dated 11/09/2023. INDICATION: Elevated liver enzymes. COMPARISON STUDY: None. TECHNIQUE: The study is performed with grayscale and duplex color flow imaging in sagittal and transverse planes limited to the right upper quadrant of the abdomen. FINDINGS: The study reveals homogenous internal echo pattern in the liver with no focal lesion or mass. Normal flow voids of the hepatic and portal veins are seen in the liver. The blood flow in the main portal vein is hepatopetal. There is no dilatation of the intrahepatic bile ducts. Normal common hepatic duct is seen measuring 4.2 mm in diameter, within normal limits. The right lobe of the liver measures 15.14 cm sagittally. Pleural effusion is seen in the right costophrenic angle above the right hemidiaphragm. The gallbladder is contracted with mildly thickened gallbladder wall measuring up to 7 mm probably secondary to incomplete distention. No shadowing gallstones are seen in the gallbladder. No pericystic free fluid is noted. There is no evidence of ascites. The head of the pancreas appears normal. The body and the tail of the pancreas are obscured by bowel gas. Normal inferior vena cava is seen with antegrade flow on the color flow images. The abdominal aorta is visualized on the transverse images and appears normal. The right renal fossa is empty and the right kidney is not visualized.   IMPRESSION: Limited study due to large amount of bowel gas. No focal lesion or mass seen in the liver. Contracted gallbladder. Recommend further evaluation with CT scan of the abdomen  and pelvis.      Other Diagnostics in 24 hours:    Assessment and Plan: Emma Hahn is a 73 y.o. year old patient, admitted on 11/06/2023 for management of congestive heart failure (CHF) and chronic obstructive pulmonary disease (COPD)currently continuing hospitalization for ongoing care.  #  Acute Hypoxic respiratory failure 2/2 to HFrEF exacerbation and possible COPD/Pna  Labs- NT-ProBNP -12986, Troponin -22<25. CXR- Cardiomegaly with pulmonary vascular congestion/pulmonary edema , EKG Sinus Tachycardia -- CHF pathway -- COPD Pathway -- Continue Lasix  40 IV everyday  -- Continue GDMT (Metoprolol , Jardiance  and Entresto ) -- On ceftriaxone and azithromycin x 3 days  -- Duo nebs PRN -- On prednisone  40 mg daily x 5 days  -- Concealing smoking cessation, nicotine patch -- Wean O2 as able   # Chronic Kidney Disease Stage 3b  Chronic Hypochromic Microcytic Anemia, likely IDA -- Cr 1.3>1.3 -- Hb 10.2 -- Avoid nephrotoxins -- Monitor BMP -- Continue iron supplementation   # Hypertension, Hyperlipidemia -- Lipid panel -- Continue metoprolol , Enteresto, Atorvastain and Lasix    #Hepatitis C Positive -- Patient is aware of her diagnosis but declined to discuss it while her daughters were on phone.  -- Plan to initiate treatment upon discharge.  -- Check AFP -- Obtain RUQ ultrasound given elevated bilirubin to assess for hepatic pathology. -- Avoid hepatotoxic meds  #Polysubstance use: -- Continue CIWA Protocol -- Case management involved for counseling and substance abuse resources.   Daily Checklist: Lines / Drains / Tubes: PIV DVT ppx: Heparin  Discharge planning needs: Med-Surge W Tele   Resident signature: Annett Sor, MBBS Electronically signed on 11/09/23  Attending attestation and signature to follow   ------------------------------------------------------------------------------- Attestation signed by Sherrilyn Kudo, MD at 11/09/23 1816 Attending Attestation  I have seen and examined the patient on 11/08/2023.  I reviewed the note written by the Fellow/Resident and agree with the documentation of the current clinical condition and future plans for care, unless modified  by my comments below.  Additional Comments: please see my separate note   []  I  personally reviewed the laboratory studies           []  I reviewed 3 or more laboratory studies []  I reviewed consultant notes []  I reviewed prior records (summary): []  I obtained history from family member, caregiver, or other. []  Independently visualized and interpreted the following radiology studies, comments: []  Independently visualized and interpreted the EKG, comments: []  Case discussed with (specialty, name):      Total Time: 55 minutes, Time was spent: , Preparing to see the patient, Obtaining and/or reviewing separately obtained hisotry, Performing a medically appropriate examination and/or evaluation, Counseling and educating the patient/family/caregiver, Ordering medications, tests, or procedures, Referring and communicating with other health care professionals, Documenting clinical information in the electronic health record, and Independently interpreting results (not separately reported) and communicating results to patient/family/caregiver    QUALITY MEASURES:   Electronically signed by: Sherrilyn Kudo, MD 11/09/2023 6:16 PM   -------------------------------------------------------------------------------

## 2023-11-21 ENCOUNTER — Emergency Department (HOSPITAL_COMMUNITY)

## 2023-11-21 ENCOUNTER — Inpatient Hospital Stay (HOSPITAL_COMMUNITY): Admission: EM | Admit: 2023-11-21 | Discharge: 2023-12-09 | DRG: 291 | Disposition: A | Discharge status: Hospice

## 2023-11-21 ENCOUNTER — Other Ambulatory Visit: Payer: Self-pay

## 2023-11-21 DIAGNOSIS — Z59868 Other specified financial insecurity: Secondary | ICD-10-CM

## 2023-11-21 DIAGNOSIS — N1832 Chronic kidney disease, stage 3b: Secondary | ICD-10-CM | POA: Diagnosis not present

## 2023-11-21 DIAGNOSIS — Z79899 Other long term (current) drug therapy: Secondary | ICD-10-CM

## 2023-11-21 DIAGNOSIS — I5084 End stage heart failure: Secondary | ICD-10-CM | POA: Diagnosis present

## 2023-11-21 DIAGNOSIS — D509 Iron deficiency anemia, unspecified: Secondary | ICD-10-CM | POA: Diagnosis present

## 2023-11-21 DIAGNOSIS — E785 Hyperlipidemia, unspecified: Secondary | ICD-10-CM | POA: Diagnosis present

## 2023-11-21 DIAGNOSIS — J449 Chronic obstructive pulmonary disease, unspecified: Secondary | ICD-10-CM | POA: Diagnosis present

## 2023-11-21 DIAGNOSIS — Z8619 Personal history of other infectious and parasitic diseases: Secondary | ICD-10-CM

## 2023-11-21 DIAGNOSIS — Z515 Encounter for palliative care: Secondary | ICD-10-CM

## 2023-11-21 DIAGNOSIS — F17210 Nicotine dependence, cigarettes, uncomplicated: Secondary | ICD-10-CM

## 2023-11-21 DIAGNOSIS — Z91148 Patient's other noncompliance with medication regimen for other reason: Secondary | ICD-10-CM

## 2023-11-21 DIAGNOSIS — Z7982 Long term (current) use of aspirin: Secondary | ICD-10-CM

## 2023-11-21 DIAGNOSIS — F10139 Alcohol abuse with withdrawal, unspecified: Secondary | ICD-10-CM | POA: Diagnosis present

## 2023-11-21 DIAGNOSIS — I5021 Acute systolic (congestive) heart failure: Secondary | ICD-10-CM | POA: Diagnosis present

## 2023-11-21 DIAGNOSIS — G9340 Encephalopathy, unspecified: Secondary | ICD-10-CM | POA: Insufficient documentation

## 2023-11-21 DIAGNOSIS — F1490 Cocaine use, unspecified, uncomplicated: Secondary | ICD-10-CM

## 2023-11-21 DIAGNOSIS — F1090 Alcohol use, unspecified, uncomplicated: Secondary | ICD-10-CM | POA: Diagnosis present

## 2023-11-21 DIAGNOSIS — I509 Heart failure, unspecified: Principal | ICD-10-CM

## 2023-11-21 DIAGNOSIS — Z751 Person awaiting admission to adequate facility elsewhere: Secondary | ICD-10-CM

## 2023-11-21 DIAGNOSIS — I272 Pulmonary hypertension, unspecified: Secondary | ICD-10-CM | POA: Diagnosis present

## 2023-11-21 DIAGNOSIS — E1122 Type 2 diabetes mellitus with diabetic chronic kidney disease: Secondary | ICD-10-CM | POA: Diagnosis present

## 2023-11-21 DIAGNOSIS — I2489 Other forms of acute ischemic heart disease: Secondary | ICD-10-CM | POA: Diagnosis not present

## 2023-11-21 DIAGNOSIS — I2510 Atherosclerotic heart disease of native coronary artery without angina pectoris: Secondary | ICD-10-CM | POA: Diagnosis present

## 2023-11-21 DIAGNOSIS — B182 Chronic viral hepatitis C: Secondary | ICD-10-CM | POA: Diagnosis present

## 2023-11-21 DIAGNOSIS — Z7984 Long term (current) use of oral hypoglycemic drugs: Secondary | ICD-10-CM

## 2023-11-21 DIAGNOSIS — Z823 Family history of stroke: Secondary | ICD-10-CM

## 2023-11-21 DIAGNOSIS — I081 Rheumatic disorders of both mitral and tricuspid valves: Secondary | ICD-10-CM | POA: Diagnosis present

## 2023-11-21 DIAGNOSIS — R570 Cardiogenic shock: Secondary | ICD-10-CM | POA: Diagnosis not present

## 2023-11-21 DIAGNOSIS — F1410 Cocaine abuse, uncomplicated: Secondary | ICD-10-CM | POA: Diagnosis present

## 2023-11-21 DIAGNOSIS — I5023 Acute on chronic systolic (congestive) heart failure: Secondary | ICD-10-CM

## 2023-11-21 DIAGNOSIS — I130 Hypertensive heart and chronic kidney disease with heart failure and stage 1 through stage 4 chronic kidney disease, or unspecified chronic kidney disease: Secondary | ICD-10-CM | POA: Diagnosis not present

## 2023-11-21 DIAGNOSIS — Z8719 Personal history of other diseases of the digestive system: Secondary | ICD-10-CM

## 2023-11-21 DIAGNOSIS — G929 Unspecified toxic encephalopathy: Secondary | ICD-10-CM | POA: Diagnosis present

## 2023-11-21 DIAGNOSIS — I428 Other cardiomyopathies: Secondary | ICD-10-CM | POA: Diagnosis present

## 2023-11-21 DIAGNOSIS — I5082 Biventricular heart failure: Secondary | ICD-10-CM | POA: Diagnosis present

## 2023-11-21 DIAGNOSIS — Z90710 Acquired absence of both cervix and uterus: Secondary | ICD-10-CM

## 2023-11-21 DIAGNOSIS — I5043 Acute on chronic combined systolic (congestive) and diastolic (congestive) heart failure: Secondary | ICD-10-CM | POA: Diagnosis present

## 2023-11-21 DIAGNOSIS — I3139 Other pericardial effusion (noninflammatory): Secondary | ICD-10-CM | POA: Diagnosis present

## 2023-11-21 DIAGNOSIS — Z5902 Unsheltered homelessness: Secondary | ICD-10-CM

## 2023-11-21 DIAGNOSIS — E872 Acidosis, unspecified: Secondary | ICD-10-CM | POA: Diagnosis not present

## 2023-11-21 DIAGNOSIS — R54 Age-related physical debility: Secondary | ICD-10-CM | POA: Diagnosis present

## 2023-11-21 DIAGNOSIS — F17200 Nicotine dependence, unspecified, uncomplicated: Secondary | ICD-10-CM | POA: Diagnosis present

## 2023-11-21 DIAGNOSIS — Z66 Do not resuscitate: Secondary | ICD-10-CM | POA: Diagnosis not present

## 2023-11-21 LAB — CBC WITH DIFFERENTIAL/PLATELET
Abs Immature Granulocytes: 0.04 K/uL (ref 0.00–0.07)
Basophils Absolute: 0 K/uL (ref 0.0–0.1)
Basophils Relative: 0 %
Eosinophils Absolute: 0.2 K/uL (ref 0.0–0.5)
Eosinophils Relative: 3 %
HCT: 36.4 % (ref 36.0–46.0)
Hemoglobin: 11.1 g/dL — ABNORMAL LOW (ref 12.0–15.0)
Immature Granulocytes: 1 %
Lymphocytes Relative: 19 %
Lymphs Abs: 1.6 K/uL (ref 0.7–4.0)
MCH: 22.1 pg — ABNORMAL LOW (ref 26.0–34.0)
MCHC: 30.5 g/dL (ref 30.0–36.0)
MCV: 72.4 fL — ABNORMAL LOW (ref 80.0–100.0)
Monocytes Absolute: 0.9 K/uL (ref 0.1–1.0)
Monocytes Relative: 11 %
Neutro Abs: 5.6 K/uL (ref 1.7–7.7)
Neutrophils Relative %: 66 %
Platelets: 254 K/uL (ref 150–400)
RBC: 5.03 MIL/uL (ref 3.87–5.11)
RDW: 20.1 % — ABNORMAL HIGH (ref 11.5–15.5)
WBC: 8.3 K/uL (ref 4.0–10.5)
nRBC: 0 % (ref 0.0–0.2)

## 2023-11-21 LAB — RAPID URINE DRUG SCREEN, HOSP PERFORMED
Amphetamines: NOT DETECTED
Barbiturates: NOT DETECTED
Benzodiazepines: NOT DETECTED
Cocaine: POSITIVE — AB
Opiates: NOT DETECTED
Tetrahydrocannabinol: NOT DETECTED

## 2023-11-21 LAB — BASIC METABOLIC PANEL WITH GFR
Anion gap: 10 (ref 5–15)
BUN: 18 mg/dL (ref 8–23)
CO2: 22 mmol/L (ref 22–32)
Calcium: 8.7 mg/dL — ABNORMAL LOW (ref 8.9–10.3)
Chloride: 106 mmol/L (ref 98–111)
Creatinine, Ser: 1.2 mg/dL — ABNORMAL HIGH (ref 0.44–1.00)
GFR, Estimated: 48 mL/min — ABNORMAL LOW (ref >60)
Glucose, Bld: 176 mg/dL — ABNORMAL HIGH (ref 70–99)
Potassium: 3.7 mmol/L (ref 3.5–5.1)
Sodium: 138 mmol/L (ref 135–145)

## 2023-11-21 LAB — COMPREHENSIVE METABOLIC PANEL WITH GFR
ALT: 22 U/L (ref 0–44)
AST: 38 U/L (ref 15–41)
Albumin: 2.8 g/dL — ABNORMAL LOW (ref 3.5–5.0)
Alkaline Phosphatase: 48 U/L (ref 38–126)
Anion gap: 13 (ref 5–15)
BUN: 23 mg/dL (ref 8–23)
CO2: 22 mmol/L (ref 22–32)
Calcium: 9 mg/dL (ref 8.9–10.3)
Chloride: 103 mmol/L (ref 98–111)
Creatinine, Ser: 1.16 mg/dL — ABNORMAL HIGH (ref 0.44–1.00)
GFR, Estimated: 49 mL/min — ABNORMAL LOW (ref >60)
Glucose, Bld: 143 mg/dL — ABNORMAL HIGH (ref 70–99)
Potassium: 4.3 mmol/L (ref 3.5–5.1)
Sodium: 138 mmol/L (ref 135–145)
Total Bilirubin: 1.7 mg/dL — ABNORMAL HIGH (ref 0.0–1.2)
Total Protein: 6.9 g/dL (ref 6.5–8.1)

## 2023-11-21 LAB — HEMOGLOBIN A1C
Hgb A1c MFr Bld: 7.5 % — ABNORMAL HIGH (ref 4.8–5.6)
Mean Plasma Glucose: 168.55 mg/dL

## 2023-11-21 LAB — TROPONIN I (HIGH SENSITIVITY)
Troponin I (High Sensitivity): 37 ng/L — ABNORMAL HIGH (ref <18)
Troponin I (High Sensitivity): 34 ng/L — ABNORMAL HIGH (ref <18)

## 2023-11-21 LAB — LIPASE, BLOOD: Lipase: 65 U/L — ABNORMAL HIGH (ref 11–51)

## 2023-11-21 LAB — URINALYSIS, ROUTINE W REFLEX MICROSCOPIC
Bilirubin Urine: NEGATIVE
Glucose, UA: NEGATIVE mg/dL
Hgb urine dipstick: NEGATIVE
Ketones, ur: NEGATIVE mg/dL
Leukocytes,Ua: NEGATIVE
Nitrite: NEGATIVE
Protein, ur: 30 mg/dL — AB
Specific Gravity, Urine: 1.008 (ref 1.005–1.030)
pH: 5 (ref 5.0–8.0)

## 2023-11-21 LAB — ETHANOL: Alcohol, Ethyl (B): <15 mg/dL (ref <15)

## 2023-11-21 LAB — GLUCOSE, CAPILLARY: Glucose-Capillary: 155 mg/dL — ABNORMAL HIGH (ref 70–99)

## 2023-11-21 LAB — MAGNESIUM: Magnesium: 1.6 mg/dL — ABNORMAL LOW (ref 1.7–2.4)

## 2023-11-21 LAB — BRAIN NATRIURETIC PEPTIDE: B Natriuretic Peptide: 2858.8 pg/mL — ABNORMAL HIGH (ref 0.0–100.0)

## 2023-11-21 MED ORDER — FUROSEMIDE 40 MG PO TABS
40.0000 mg | ORAL_TABLET | Freq: Once | ORAL | Status: AC
  Administered 2023-11-22: 40 mg via ORAL
  Filled 2023-11-21: qty 1

## 2023-11-21 MED ORDER — FUROSEMIDE 10 MG/ML IJ SOLN
40.0000 mg | Freq: Once | INTRAMUSCULAR | Status: AC
  Administered 2023-11-21: 40 mg via INTRAVENOUS
  Filled 2023-11-21: qty 4

## 2023-11-21 MED ORDER — LORAZEPAM 1 MG PO TABS: 0.0000 mg | ORAL_TABLET | Freq: Four times a day (QID) | ORAL | Status: DC

## 2023-11-21 MED ORDER — LORAZEPAM 2 MG/ML IJ SOLN
0.0000 mg | Freq: Four times a day (QID) | INTRAMUSCULAR | Status: DC
  Administered 2023-11-21: 1 mg via INTRAVENOUS
  Filled 2023-11-21: qty 1

## 2023-11-21 MED ORDER — LORAZEPAM 2 MG/ML IJ SOLN: 0.0000 mg | Freq: Two times a day (BID) | INTRAMUSCULAR | Status: DC

## 2023-11-21 MED ORDER — ATORVASTATIN CALCIUM 40 MG PO TABS
40.0000 mg | ORAL_TABLET | Freq: Every day | ORAL | Status: DC
  Administered 2023-11-22 – 2023-11-23 (×2): 40 mg via ORAL
  Filled 2023-11-21 (×2): qty 1

## 2023-11-21 MED ORDER — ALBUTEROL SULFATE (2.5 MG/3ML) 0.083% IN NEBU
2.5000 mg | INHALATION_SOLUTION | RESPIRATORY_TRACT | Status: DC | PRN
Start: 2023-11-21 — End: 2023-11-23

## 2023-11-21 MED ORDER — LOSARTAN POTASSIUM 25 MG PO TABS: 12.5000 mg | ORAL_TABLET | Freq: Every day | ORAL | Status: DC

## 2023-11-21 MED ORDER — ACETAMINOPHEN 325 MG PO TABS: 650.0000 mg | ORAL_TABLET | Freq: Four times a day (QID) | ORAL | Status: DC | PRN

## 2023-11-21 MED ORDER — THIAMINE HCL 100 MG/ML IJ SOLN
100.0000 mg | Freq: Every day | INTRAMUSCULAR | Status: DC
  Filled 2023-11-21: qty 2

## 2023-11-21 MED ORDER — INSULIN ASPART 100 UNIT/ML IJ SOLN: 0.0000 [IU] | Freq: Every day | INTRAMUSCULAR | Status: DC

## 2023-11-21 MED ORDER — INSULIN ASPART 100 UNIT/ML IJ SOLN
0.0000 [IU] | Freq: Three times a day (TID) | INTRAMUSCULAR | Status: DC
  Administered 2023-11-21: 3 [IU] via SUBCUTANEOUS
  Administered 2023-11-22 (×2): 1 [IU] via SUBCUTANEOUS
  Administered 2023-11-22: 2 [IU] via SUBCUTANEOUS
  Administered 2023-11-23: 1 [IU] via SUBCUTANEOUS
  Administered 2023-11-23: 2 [IU] via SUBCUTANEOUS
  Administered 2023-11-23: 1 [IU] via SUBCUTANEOUS
  Administered 2023-11-24 (×2): 2 [IU] via SUBCUTANEOUS
  Administered 2023-11-24: 1 [IU] via SUBCUTANEOUS
  Administered 2023-11-25: 2 [IU] via SUBCUTANEOUS
  Administered 2023-11-25 – 2023-11-26 (×4): 1 [IU] via SUBCUTANEOUS
  Filled 2023-11-21: qty 1
  Filled 2023-11-21: qty 2
  Filled 2023-11-21 (×4): qty 1
  Filled 2023-11-21: qty 2
  Filled 2023-11-21 (×3): qty 1
  Filled 2023-11-21: qty 2
  Filled 2023-11-21 (×2): qty 1

## 2023-11-21 MED ORDER — THIAMINE MONONITRATE 100 MG PO TABS
100.0000 mg | ORAL_TABLET | Freq: Every day | ORAL | Status: DC
  Administered 2023-11-21 – 2023-11-23 (×3): 100 mg via ORAL
  Filled 2023-11-21 (×3): qty 1

## 2023-11-21 MED ORDER — NICOTINE 7 MG/24HR TD PT24
7.0000 mg | MEDICATED_PATCH | Freq: Every day | TRANSDERMAL | Status: DC
  Administered 2023-11-22 – 2023-12-09 (×10): 7 mg via TRANSDERMAL
  Filled 2023-11-21 (×20): qty 1

## 2023-11-21 MED ORDER — RIVAROXABAN 10 MG PO TABS
10.0000 mg | ORAL_TABLET | Freq: Every day | ORAL | Status: DC
  Administered 2023-11-21 – 2023-11-25 (×4): 10 mg via ORAL
  Filled 2023-11-21 (×4): qty 1

## 2023-11-21 MED ORDER — LORAZEPAM 1 MG PO TABS: 0.0000 mg | ORAL_TABLET | Freq: Two times a day (BID) | ORAL | Status: DC

## 2023-11-21 MED ORDER — SENNOSIDES-DOCUSATE SODIUM 8.6-50 MG PO TABS
1.0000 | ORAL_TABLET | Freq: Every evening | ORAL | Status: DC | PRN
  Administered 2023-11-24: 1 via ORAL
  Filled 2023-11-21: qty 1

## 2023-11-21 MED ORDER — MAGNESIUM SULFATE 4 GM/100ML IV SOLN
4.0000 g | Freq: Once | INTRAVENOUS | Status: AC
  Administered 2023-11-21: 4 g via INTRAVENOUS
  Filled 2023-11-21: qty 100

## 2023-11-21 MED ORDER — ACETAMINOPHEN 650 MG RE SUPP: 650.0000 mg | Freq: Four times a day (QID) | RECTAL | Status: DC | PRN

## 2023-11-21 MED ORDER — FLUTICASONE FUROATE-VILANTEROL 100-25 MCG/ACT IN AEPB
1.0000 | INHALATION_SPRAY | Freq: Every day | RESPIRATORY_TRACT | Status: DC
  Administered 2023-11-22 – 2023-11-25 (×3): 1 via RESPIRATORY_TRACT
  Filled 2023-11-21 (×2): qty 28

## 2023-11-21 NOTE — ED Notes (Signed)
 Pt refuses vitals. Will not cooperate

## 2023-11-21 NOTE — H&P (Signed)
 Date: 11/21/2023               Patient Name:  Emma Hahn MRN: [id number]  DOB: March 08, 1950 Age / Sex: 73 y.o., female   PCP: Rosalea Rosina SAILOR, PA         Medical Service: Internal Medicine Teaching Service         Attending Physician: Dr. Reyes Fenton      First Contact: Viktoria King, DO}    Second Contact: Dr. Damien Lease, DO         Pager Information: First Contact Pager: [phone]   Second Contact Pager: [phone]   SUBJECTIVE   Chief Complaint: Confusion  History of Present Illness: Azhane Eckart is a 73 y.o. female with PMH of polysubtance use disorder, chronic combined systolic and diastolic heart failure, COPD, chronic kidney disease stage 3b, substance use disorder, recurrent hospitalizations, who was recently discharged from [facility] [place]  [facility] for exacerbation of her chronic HF, found in [place], confused, knocking on strangers' doors. She was brought in to [facility] for further evaluation.   Of note, patient used to reside in [place] and moved to her daughter's house in [place]  after losing her housing. Daughter then asked her to leave as patient continued to use crack cocaine while at her daughters. Patient told me she drove herself to [facility] in hopes to stay with a friend. She used cocaine that  was probably laced with other stuff yesterday. She thinks she left her car on [facility] and laughed when I told her she was confused and knocking people's homes. She also admits to 1+ pack per day of tobacco, 1-2 small liquor bottles every other day or so, but denies IV drug use.   Currently her main concerns are shortness of breath when moving and her severe swelling. She had some of her medications shortly after discharge but does not remember taking them consistently.   She is aware of her severe heart failure. Recalls having a conversation with palliative care in [place] ; confirms that she is DNR/ DNI. Agrees to admission to IMTS.   ED  nurse communicated with  daughter in [place] , who stated she will not come to support mother. Patient is not to return to her home in [place]  unless no cocaine use.   Initial ED evaluation, hemodynamically stable, BNP ~2900, stable renal function without electrolyte abnormalities, CXR with pulmonary edema, EKG without ischemic changes. Mild troponin elevation. S/p 40 mg lasix  prior to consult for admission to IMTS.  Meds:  Patient reported: she does not remember them. Per the last discharge note:  Atorvastatin  40 mg daily Symbicort 2 puff daily Jardiance  10 mg daily Iron tablets Folic acid  Furosemide  40 mg daily Metoprolol   succinate 35 mg tablet ER Entresto  24-26 mg BID Thiamine  Spironolactone  25 mg stopped 10/2023  Past Medical History  COPD Biventricular heart failure CKD3b HTN HLD Hepatitis C+ Alcohol, cocaine, and tobacco use disorder DM  Past Medical History:  Diagnosis Date   Borderline diabetes    COPD (chronic obstructive pulmonary disease) (HCC)    Habitual alcohol use    Tobacco abuse      Past Surgical History Past Surgical History:  Procedure Laterality Date   RIGHT/LEFT HEART CATH AND CORONARY ANGIOGRAPHY N/A 12/08/2019   Procedure: RIGHT/LEFT HEART CATH AND CORONARY ANGIOGRAPHY;  Surgeon: Burnard Debby LABOR, MD;  Location: [facility] INVASIVE CV LAB;  Service: Cardiovascular;  Laterality: N/A;   TEE WITHOUT CARDIOVERSION N/A 12/09/2019   Procedure: TRANSESOPHAGEAL ECHOCARDIOGRAM (TEE);  Surgeon: Mona Vinie BROCKS, MD;  Location: [facility] ENDOSCOPY;  Service: Cardiovascular;  Laterality: N/A;   TOTAL ABDOMINAL HYSTERECTOMY       Social:  Currently un-hommed Support: currently no one Level of Function: drives herself, usually able to pick up medications. Does not use mobility aid PCP:  Rosalea Rosina SAILOR, PA  Substances: -Tobacco: +40 pack years -Alcohol: 2 small liquor bottles (?travel size) -Recreational Drug: Cocaine mostly  Family History:  Family History   Problem Relation Age of Onset   Stroke Maternal Grandmother        ? Possible heart disease too - patient unsure     Allergies: Allergies as of 11/21/2023   (No Known Allergies)    Review of Systems: A complete ROS was negative except as per HPI.   OBJECTIVE:   Physical Exam: Blood pressure 131/78, pulse 100, temperature 98.3 F (36.8 C), temperature source Oral, resp. rate 14, height 5' 4 (1.626 m), weight 63.5 kg, SpO2 96%.  Constitutional: Elderly woman laying bed, in no acute distress HENT: normocephalic atraumatic, mucous membranes moist Neck: supple, JVP to midneck Cardiovascular:RRR, 4/6 systolic murmur Pulmonary/Chest: normal work of breathing on room air, bilateral crackles Abdominal: soft, non-tender, non-distended MSK: normal bulk and tone, 2+ edema to mid thigh, bilateral flanks, and neck Neurological: alert & oriented x 4, moving all extremities volitionally Skin: warm and dry Psych: Normal mood and affect  Labs: CBC    Component Value Date/Time   WBC 8.3 11/21/2023 0815   RBC 5.03 11/21/2023 0815   HGB 11.1 (L) 11/21/2023 0815   HCT 36.4 11/21/2023 0815   PLT 254 11/21/2023 0815   MCV 72.4 (L) 11/21/2023 0815   MCH 22.1 (L) 11/21/2023 0815   MCHC 30.5 11/21/2023 0815   RDW 20.1 (H) 11/21/2023 0815   LYMPHSABS 1.6 11/21/2023 0815   MONOABS 0.9 11/21/2023 0815   EOSABS 0.2 11/21/2023 0815   BASOSABS 0.0 11/21/2023 0815     CMP     Component Value Date/Time   NA 138 11/21/2023 0815   NA 138 02/19/2020 1049   K 4.3 11/21/2023 0815   CL 103 11/21/2023 0815   CO2 22 11/21/2023 0815   GLUCOSE 143 (H) 11/21/2023 0815   BUN 23 11/21/2023 0815   BUN 14 02/19/2020 1049   CREATININE 1.16 (H) 11/21/2023 0815   CALCIUM  9.0 11/21/2023 0815   PROT 6.9 11/21/2023 0815   ALBUMIN 2.8 (L) 11/21/2023 0815   AST 38 11/21/2023 0815   ALT 22 11/21/2023 0815   ALKPHOS 48 11/21/2023 0815   BILITOT 1.7 (H) 11/21/2023 0815   GFRNONAA 49 (L) 11/21/2023 0815    GFRAA 74 02/19/2020 1049    Imaging:  CT Head Wo Contrast Result Date: 11/21/2023 EXAM: CT HEAD WITHOUT CONTRAST 11/21/2023 08:45:41 AM TECHNIQUE: CT of the head was performed without the administration of intravenous contrast. Automated exposure control, iterative reconstruction, and/or weight based adjustment of the mA/kV was utilized to reduce the radiation dose to as low as reasonably achievable. COMPARISON: None available. CLINICAL HISTORY: 73 year old female. Mental status change, unknown cause. FINDINGS: BRAIN AND VENTRICLES: Brain volume within normal limits for age. Mild for age patchy white matter hypodensity, mostly in the deep white matter capsules. 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 No suspicious intracranial vascular hyperdensity. No acute hemorrhage. No evidence of acute infarct. No hydrocephalus. No extra-axial collection. No mass effect or midline shift. ORBITS: No acute abnormality. SINUSES: Paranasal sinuses, middle ears and  mastoids well aerated. SOFT TISSUES AND SKULL: Calcified atherosclerosis at the skull base. No acute soft tissue abnormality. No skull fracture. IMPRESSION: 1. No acute intracranial abnormality. 2. Mild for age chronic small vessel disease. Electronically signed by: Helayne Hurst MD 11/21/2023 08:57 AM EST RP Workstation: [id number]   DG Chest 2 View Result Date: 11/21/2023 EXAM: 2 VIEW(S) XRAY OF THE CHEST 11/21/2023 08:29:00 AM COMPARISON: 03/04/2023 CLINICAL HISTORY: CHF FINDINGS: LUNGS AND PLEURA: Probable mild bilateral pulmonary edema is noted. Small pleural effusions are present. No focal pulmonary opacity. No pneumothorax. HEART AND MEDIASTINUM: Stable cardiomegaly. BONES AND SOFT TISSUES: No acute osseous abnormality. IMPRESSION: 1. Stable cardiomegaly. 2. Findings compatible with mild pulmonary edema. 3. Small pleural effusions. Electronically signed by: Lynwood Seip MD 11/21/2023 08:34 AM EST RP  Workstation: [id number]     PTA imaging: Impression Only: CAR Echo 2D Adult Complete Result Date: 10/13/2023                                                                                                   Version: 1                                                                                                   Study ID: [id number]                                                                       [place]                                                                   [employer] [place] [facility] [place] [address]                                                                  Adult Echocardiogram Report Name: GEORGETTE, HELMER                               Study Date: 10/13/2023, 12: 28 PM       A complete two-dimensional transthoracic echocardiogram was performed (2D, M-mode, spectral and color flow Doppler). ([id number]). Study quality is good. Indications Echo was performed for suspected Hypertension. Left Ventricle There is normal left ventricular wall thickness. The left ventricle is mild to moderate dilated. No evidence of thrombus seen on this study. Ejection Fraction = 20-25%. LV diastolic filling appears normal. There is severe global hypokinesis of the left ventricle. Right Ventricle The right ventricle is mildly dilated. The right ventricular systolic function is normal. The right ventricular wall motion is normal. Atria The left atrium is mildly dilated. The right atrium is moderate to severely dilated. Mitral Valve Mitral valve structure is normal. There is no evidence of mitral valve prolapse. There is moderate mitral regurgitation. The regurgitant jet is posteriorly directed. The severity of mitral regurgitation may be UNDERestimated due to the eccentric nature of the regurgitant jet. No mitral valve stenosis. Tricuspid Valve  Anatomically normal tricuspid valve. There is no tricuspid valve prolapse. There is moderate to severe tricuspid regurgitation. Estimated RVSP is mildly elevated at 36 mmHg. No evidence of tricuspid stenosis. Aortic Valve The aortic valve appears structurally normal. The aortic valve is trileaflet. No aortic regurgitation. There is no aortic stenosis. Pulmonic Valve No evidence of stenosis. The pulmonic valve is normal. There is no vegetation seen on the pulmonic valve. There is no pulmonic valve regurgitation. Great Vessels The ascending aorta is normal in size. The aortic root is normal in size. IVC Dilated IVC consistent with elevated RA pressure. Pericardium Trivial pericardial effusion, not hemodynamically significant. There is no pleural effusion noted on this exam. Summary Statements Electronically Signed by: Debbrah Rana, MD                                       10/13/2023, 3: 25 PM Ordering Physician: OSWALDO BRING Referring Physician: OSWALDO BRING Performed By: Norlene Barnie Don    EKG: personally reviewed my interpretation is sinus tachycardia, PVC. No signs of ischemia.  Prior EKG 02/2023  ASSESSMENT & PLAN:   Assessment & Plan by Problem: Principal Problem:   Acute exacerbation of CHF (congestive heart failure) (HCC)    Acute on chronic biventricular sytolic heart failure Nonischemic cardiomyopathy per [facility] in 2021 Recently discharged for history of same in [place]  10/2023. At that time TTE was done with LVEF estimated to be 20-25% and severe global hypokinesis. No valvular disease. This LVEF is decreased from the TTE done at [facility] during 02/2023 admission, where the LVED was estimated to be 30-35%. Patient has been nonadherent to medications and continues to use cocaine. No signs of ischemia on EKG and troponin leak peaked at 34, likely 2.2 demand. No low output physiology. Currently on RA. Increased biomarkers this admission. ProBNP at  [place]  severely elevated. BNP here  severely elevated from 8 months ago. Will continue to diurese and monitor O2 sats with ambulation.  - Resume PTA medications as tolerated - Strict I/Os and weight daily - S/p furosemide  40 mg IV; will repeat this afternoon - Continued on Losartan  25 mg daily to balance potassium loss - Will add beta blocker and Jardiance  before discharge - Goal K >4, Mg >2; supplemented today  Toxic encephalopathy, now resolved No leukocytosis. BUN at baseline. No LFT abnormalities. No AGMA. <15 ethanol. Ct head without acute abnormalities. Waiting for UDS but she has admitted to cocaine and other illicit drugs - Continue to monitor - Patient with capacity at this time  Polysubstance use disorder CIWA with ativan  - Counseled - Thiamine  and folate supplementation - Monitor for refeeding syndrome  HTN HLD - Resumed atorvastatin  40 mg - Resumed Losartan  per above  COPD Current smoker Recently treated for COPD exacerbation in setting of possible CAP in [place] . She was to finish 2 days of 5-day course of prednisone  at home. No wheezing or signs of exacerbation - SPO2 goal 89 -92% - PTA inhalers added  CKD 3b Bc 1.3 per records. At her baseline - Strict in/outs - avoiding nephrotoxic medications to allow for HFrEF GDMT  Hx Hepatitis C, untreated LFTs on admission within normal limits. RUQ US  10/2023 at outside hospital with no focal lesion or mass seen in the liver. Contracted gallbladder. AFP 5.74, normal limits. Patient does not wish for treatment at discharge - See careEverywhere recent labs and imaging - May need STI testing prior to discharge  Code documentation Palliative care consulted during last admission in [place] . DNR/ DNI per 11/07/2023 consult done with Palliative care provider Dr. Ivan Zama in [place] . Patient does not want BiPAP or cPAP per that discussion. See in chart. Reiterated this information on admission  Best practice: Diet: Heart Healthy VTE: DOAC  for DVT Code:  DNR/DNI  Disposition planning: Prior to Admission Living Arrangement: Homeless Anticipated Discharge Location: pending PT and OT  Dispo: Admit patient to Observation with expected length of stay less than 2 midnights.  Signed: Elnora Ip, MD Internal Medicine Resident  11/21/2023, 2:07 PM  On Call pager: [phone]

## 2023-11-21 NOTE — TOC CM/SW Note (Signed)
 Transition of Care [facility]) - Inpatient Brief Assessment   Patient Details  Name: Harini Dearmond MRN: [id number] Date of Birth: 06/19/1950  [facility] CM/SW Contact:    Waddell Barnie Rama, RN Phone Number: 11/21/2023, 4:20 PM   Clinical Narrative: From home with daughter, has PCP and insurance on file, states has no HH services in place at this time or DME at home.  States family member (daughter)  will transport them home at [employer] and family is support system, states gets medications from [facility] .  Pta self ambulatory.   There are no ICM  needs identified  at this time.  Please place consult for ICM needs.     Transition of Care Asessment: Insurance and Status: Insurance coverage has been reviewed Patient has primary care physician: Yes Home environment has been reviewed: home with daughter Prior level of function:: indep Prior/Current Home Services: No current home services Social Drivers of Health Review: SDOH reviewed no interventions necessary Readmission risk has been reviewed: Yes Transition of care needs: no transition of care needs at this time

## 2023-11-21 NOTE — ED Notes (Signed)
 Pt refuses to wear monitoring. Was able to use bedpan. Pt states she does not want nicotine patch at this time.

## 2023-11-21 NOTE — ED Triage Notes (Addendum)
 Pt BIB [facility] from a local apartment complex, pt knocked on a door saying she lived there and the resident of that apartment called 911. Pt's car on site, appears to be living out of her car. Pt half-clothed on EMS arrival. Pt alert to herself only. Uncooperative with EMS. Pt c/o bilateral leg pain.  VS 130/80, 100HR, 98% RA, RR 18, CBG 148, 12 lead unremarkable

## 2023-11-21 NOTE — ED Provider Notes (Signed)
 [facility] Provider Note   CSN: [id number] Arrival date & time: 11/21/23  9248     Patient presents with: Altered Mental Status   Emma Hahn is a 73 y.o. female.   This is a 73 year old female presenting emergency department for reported altered mental status.  Was found knocking on doors in an apartment complex trying to look for a place to stay.  Nurses called family patient reportedly with polysubstance abuse/cocaine and crack and alcohol use.  Was kicked out of the house a week or so ago and drove down to [place]  for unknown reason.  Unclear if she did drugs last night.  On my examination she is alert and oriented x 3.  Complaining of some shortness of breath and lower extremity edema.  She is not taking any of her medications for unknown duration of time.   Altered Mental Status      Prior to Admission medications   Medication Sig Start Date End Date Taking? Authorizing Provider  albuterol  (VENTOLIN  HFA) 108 (90 Base) MCG/ACT inhaler Inhale 2 puffs into the lungs every 6 (six) hours as needed for wheezing or shortness of breath.    [provider]  aspirin  EC 81 MG tablet Take 1 tablet (81 mg total) by mouth daily. Swallow whole. 08/15/21   Milford, Harlene HERO, FNP  atorvastatin  (LIPITOR) 40 MG tablet Take 1 tablet (40 mg total) by mouth daily. NEEDS FOLLOW UP APPOINTMENT FOR MORE REFILLS Patient taking differently: Take 40 mg by mouth daily. 08/22/22   Bensimhon, Toribio SAUNDERS, MD  furosemide  (LASIX ) 40 MG tablet Take 1 tablet (40 mg total) by mouth 2 (two) times daily. 03/08/23   Arrien, Mauricio Daniel, MD  losartan  (COZAAR ) 25 MG tablet Take 0.5 tablets (12.5 mg total) by mouth daily. 03/08/23 04/07/23  Arrien, Mauricio Daniel, MD  metoprolol  succinate (TOPROL -XL) 25 MG 24 hr tablet Take 1 tablet (25 mg total) by mouth daily. 03/09/23 04/08/23  Arrien, Mauricio Daniel, MD  nitroGLYCERIN  (NITROSTAT ) 0.4 MG SL tablet Place  1 tablet (0.4 mg total) under the tongue every 5 (five) minutes as needed for chest pain. 08/15/21   Milford, Harlene HERO, FNP  spironolactone  (ALDACTONE ) 25 MG tablet Take 0.5 tablets (12.5 mg total) by mouth daily. 03/09/23 05/07/23  Arrien, Elidia Toribio, MD    Allergies: Patient has no known allergies.    Review of Systems  Updated Vital Signs BP 131/78   Pulse 100   Temp 98.3 F (36.8 C) (Oral)   Resp 14   Ht 5' 4 (1.626 m)   Wt 63.5 kg   SpO2 96%   BMI 24.03 kg/m   Physical Exam Vitals and nursing note reviewed.  Constitutional:      General: She is not in acute distress.    Appearance: She is not toxic-appearing.  HENT:     Head: Normocephalic and atraumatic.     Nose: Nose normal.     Mouth/Throat:     Mouth: Mucous membranes are moist.  Eyes:     Conjunctiva/sclera: Conjunctivae normal.  Cardiovascular:     Rate and Rhythm: Normal rate and regular rhythm.  Pulmonary:     Effort: Pulmonary effort is normal.     Breath sounds: Normal breath sounds.  Abdominal:     General: Abdomen is flat. There is no distension.     Tenderness: There is no abdominal tenderness. There is no guarding or rebound.  Musculoskeletal:     Right  lower leg: Edema present.     Left lower leg: Edema present.  Skin:    General: Skin is warm and dry.     Capillary Refill: Capillary refill takes less than 2 seconds.  Neurological:     Mental Status: She is alert and oriented to person, place, and time.  Psychiatric:        Mood and Affect: Mood normal.        Behavior: Behavior normal.     (all labs ordered are listed, but only abnormal results are displayed) Labs Reviewed  CBC WITH DIFFERENTIAL/PLATELET - Abnormal; Notable for the following components:      Result Value   Hemoglobin 11.1 (*)    MCV 72.4 (*)    MCH 22.1 (*)    RDW 20.1 (*)    All other components within normal limits  COMPREHENSIVE METABOLIC PANEL WITH GFR - Abnormal; Notable for the following components:    Glucose, Bld 143 (*)    Creatinine, Ser 1.16 (*)    Albumin 2.8 (*)    Total Bilirubin 1.7 (*)    GFR, Estimated 49 (*)    All other components within normal limits  LIPASE, BLOOD - Abnormal; Notable for the following components:   Lipase 65 (*)    All other components within normal limits  BRAIN NATRIURETIC PEPTIDE - Abnormal; Notable for the following components:   B Natriuretic Peptide 2,858.8 (*)    All other components within normal limits  TROPONIN I (HIGH SENSITIVITY) - Abnormal; Notable for the following components:   Troponin I (High Sensitivity) 37 (*)    All other components within normal limits  TROPONIN I (HIGH SENSITIVITY) - Abnormal; Notable for the following components:   Troponin I (High Sensitivity) 34 (*)    All other components within normal limits  ETHANOL  RAPID URINE DRUG SCREEN, HOSP PERFORMED  URINALYSIS, ROUTINE W REFLEX MICROSCOPIC    EKG: None  Radiology: CT Head Wo Contrast Result Date: 11/21/2023 EXAM: CT HEAD WITHOUT CONTRAST 11/21/2023 08:45:41 AM TECHNIQUE: CT of the head was performed without the administration of intravenous contrast. Automated exposure control, iterative reconstruction, and/or weight based adjustment of the mA/kV was utilized to reduce the radiation dose to as low as reasonably achievable. COMPARISON: None available. CLINICAL HISTORY: 73 year old female. Mental status change, unknown cause. FINDINGS: BRAIN AND VENTRICLES: Brain volume within normal limits for age. Mild for age patchy white matter hypodensity, mostly in the deep white matter capsules. 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 No suspicious intracranial vascular hyperdensity. No acute hemorrhage. No evidence of acute infarct. No hydrocephalus. No extra-axial collection. No mass effect or midline shift. ORBITS: No acute abnormality. SINUSES: Paranasal sinuses, middle ears and mastoids well aerated. SOFT TISSUES AND SKULL:  Calcified atherosclerosis at the skull base. No acute soft tissue abnormality. No skull fracture. IMPRESSION: 1. No acute intracranial abnormality. 2. Mild for age chronic small vessel disease. Electronically signed by: Helayne Hurst MD 11/21/2023 08:57 AM EST RP Workstation: [id number]   DG Chest 2 View Result Date: 11/21/2023 EXAM: 2 VIEW(S) XRAY OF THE CHEST 11/21/2023 08:29:00 AM COMPARISON: 03/04/2023 CLINICAL HISTORY: CHF FINDINGS: LUNGS AND PLEURA: Probable mild bilateral pulmonary edema is noted. Small pleural effusions are present. No focal pulmonary opacity. No pneumothorax. HEART AND MEDIASTINUM: Stable cardiomegaly. BONES AND SOFT TISSUES: No acute osseous abnormality. IMPRESSION: 1. Stable cardiomegaly. 2. Findings compatible with mild pulmonary edema. 3. Small pleural effusions. Electronically signed by: Lynwood Seip MD  11/21/2023 08:34 AM EST RP Workstation: [id number]     Procedures   Medications Ordered in the ED  LORazepam  (ATIVAN ) injection 0-4 mg ( Intravenous Not Given 11/21/23 0821)    Or  LORazepam  (ATIVAN ) tablet 0-4 mg ( Oral See Alternative 11/21/23 9178)  LORazepam  (ATIVAN ) injection 0-4 mg (has no administration in time range)    Or  LORazepam  (ATIVAN ) tablet 0-4 mg (has no administration in time range)  thiamine  (VITAMIN B1) tablet 100 mg (100 mg Oral Given 11/21/23 0937)    Or  thiamine  (VITAMIN B1) injection 100 mg ( Intravenous See Alternative 11/21/23 0937)  furosemide  (LASIX ) injection 40 mg (40 mg Intravenous Given 11/21/23 1211)    Clinical Course as of 11/21/23 1230  Thu Nov 21, 2023  0801 Admitted last month in [place]  per my chart review: 73 y.o. female who presents with PMHx of HFrEF (LVEF 20-25% 10/13/2023), HTN, HLD, COPD (not on home O2), CKD, hepatitis C (viral load 7,220,000) and AUD [TY]  0802 Patient has a history of alcohol abuse and polysubstance abuse per my chart review.  CIWA ordered. [TY]  [id number] Troponin I (High Sensitivity)(!): 37 Chronic  elevation [TY]  0934 Lipase(!): 65 Mild elevation.  Not having abdominal pain [TY]  0934 Comprehensive metabolic panel(!) No significant metabolic derangements noted explain her symptoms [TY]  0934 B Natriuretic Peptide(!): 2,858.8 Significant elevation in her BNP.  Clinically does look fluid overloaded [TY]  0934 DG Chest 2 View MPRESSION: 1. Stable cardiomegaly. 2. Findings compatible with mild pulmonary edema. 3. Small pleural effusions.   [TY]  0934 CT Head Wo Contrast IMPRESSION: 1. No acute intracranial abnormality. 2. Mild for age chronic small vessel disease.  Electronically signed by: Helayne Hurst MD 11/21/2023 08:57 AM EST RP Workstation: [id number]   [TY]  1212 Spoke with hospitalist for admission. [TY]    Clinical Course User Index [TY] Neysa Caron PARAS, DO                                 Medical Decision Making This is a 73 year old female presenting emergency department for reported altered mental status.  She is alert and oriented x 3 with no localizing neurodeficits on my exam.  She is afebrile, mildly elevated heart rate normotensive maintaining oxygen saturation on room air.  Clinically appears hypervolemic with pitting lower extremity edema, rhonchorous on exam.  But does not appear to be in overt respiratory distress.  Per chart review does have a history of CHF with a severely depressed EF as well as polysubstance abuse.  Workup today with a negative CT head, again no neurodeficits.  Acute stroke felt to be less likely.  She has no leukocytosis or fever to suggest systemic infection.  Minor anemia noted.  Alcohol level undetectable, but given her history of alcohol abuse CIWA protocol ordered.  Does not appear to be overtly detoxing currently no significant metabolic derangements minor elevation in creatinine, but appears to be at baseline.  Not having abdominal pain her BNP and troponin are elevated which is concerning for decompensated heart.  Especially in the  setting of her social economic status of essentially living out of her car.  Chest x-ray with some mild pulmonary edema as well.  IV Lasix  ordered.  Given patient's lack of outpatient resources and recent relocation to [place] with no medications.  Will admit for diuresis.  Care discussed with hospitalist for admission.  Amount and/or Complexity of Data  Reviewed External Data Reviewed:     Details: See ED course Labs: ordered. Decision-making details documented in ED Course. Radiology: ordered and independent interpretation performed. Decision-making details documented in ED Course.    Details: No pneumonia pneumothorax.  Does have some edema ECG/medicine tests: ordered and independent interpretation performed.    Details: No STEMI or ischemic changes Discussion of management or test interpretation with external provider(s): Hospitalist  Risk OTC drugs. Prescription drug management. Decision regarding hospitalization. Diagnosis or treatment significantly limited by social determinants of health. Risk Details: Homeless.       Final diagnoses:  Acute congestive heart failure, unspecified heart failure type [facility])    ED Discharge Orders     None          Neysa Caron PARAS, DO 11/21/23 1230

## 2023-11-21 NOTE — ED Notes (Signed)
 MD Young notified and aware of patient use of crack cocaine recently.  Pt living out of car per daughter.  MD Young also notified and aware of social work needs.

## 2023-11-22 ENCOUNTER — Other Ambulatory Visit (HOSPITAL_COMMUNITY): Payer: Self-pay

## 2023-11-22 ENCOUNTER — Telehealth (HOSPITAL_COMMUNITY): Payer: Self-pay | Admitting: Pharmacy Technician

## 2023-11-22 DIAGNOSIS — E872 Acidosis, unspecified: Secondary | ICD-10-CM | POA: Diagnosis not present

## 2023-11-22 DIAGNOSIS — F1410 Cocaine abuse, uncomplicated: Secondary | ICD-10-CM

## 2023-11-22 DIAGNOSIS — G929 Unspecified toxic encephalopathy: Secondary | ICD-10-CM | POA: Diagnosis present

## 2023-11-22 DIAGNOSIS — I509 Heart failure, unspecified: Secondary | ICD-10-CM | POA: Diagnosis present

## 2023-11-22 DIAGNOSIS — I5043 Acute on chronic combined systolic (congestive) and diastolic (congestive) heart failure: Secondary | ICD-10-CM

## 2023-11-22 DIAGNOSIS — E1122 Type 2 diabetes mellitus with diabetic chronic kidney disease: Secondary | ICD-10-CM | POA: Diagnosis present

## 2023-11-22 DIAGNOSIS — R0609 Other forms of dyspnea: Secondary | ICD-10-CM | POA: Diagnosis not present

## 2023-11-22 DIAGNOSIS — I110 Hypertensive heart disease with heart failure: Secondary | ICD-10-CM | POA: Diagnosis not present

## 2023-11-22 DIAGNOSIS — F17200 Nicotine dependence, unspecified, uncomplicated: Secondary | ICD-10-CM

## 2023-11-22 DIAGNOSIS — I2489 Other forms of acute ischemic heart disease: Secondary | ICD-10-CM | POA: Diagnosis not present

## 2023-11-22 DIAGNOSIS — I3139 Other pericardial effusion (noninflammatory): Secondary | ICD-10-CM | POA: Diagnosis present

## 2023-11-22 DIAGNOSIS — B182 Chronic viral hepatitis C: Secondary | ICD-10-CM | POA: Diagnosis present

## 2023-11-22 DIAGNOSIS — I10 Essential (primary) hypertension: Secondary | ICD-10-CM | POA: Diagnosis not present

## 2023-11-22 DIAGNOSIS — J449 Chronic obstructive pulmonary disease, unspecified: Secondary | ICD-10-CM | POA: Diagnosis present

## 2023-11-22 DIAGNOSIS — D509 Iron deficiency anemia, unspecified: Secondary | ICD-10-CM | POA: Diagnosis present

## 2023-11-22 DIAGNOSIS — F1910 Other psychoactive substance abuse, uncomplicated: Secondary | ICD-10-CM | POA: Diagnosis not present

## 2023-11-22 DIAGNOSIS — R570 Cardiogenic shock: Secondary | ICD-10-CM | POA: Diagnosis not present

## 2023-11-22 DIAGNOSIS — Z66 Do not resuscitate: Secondary | ICD-10-CM | POA: Diagnosis not present

## 2023-11-22 DIAGNOSIS — Z7189 Other specified counseling: Secondary | ICD-10-CM | POA: Diagnosis not present

## 2023-11-22 DIAGNOSIS — I5084 End stage heart failure: Secondary | ICD-10-CM | POA: Diagnosis present

## 2023-11-22 DIAGNOSIS — Z5902 Unsheltered homelessness: Secondary | ICD-10-CM | POA: Diagnosis not present

## 2023-11-22 DIAGNOSIS — I081 Rheumatic disorders of both mitral and tricuspid valves: Secondary | ICD-10-CM | POA: Diagnosis present

## 2023-11-22 DIAGNOSIS — F10139 Alcohol abuse with withdrawal, unspecified: Secondary | ICD-10-CM | POA: Diagnosis present

## 2023-11-22 DIAGNOSIS — E785 Hyperlipidemia, unspecified: Secondary | ICD-10-CM | POA: Diagnosis present

## 2023-11-22 DIAGNOSIS — I428 Other cardiomyopathies: Secondary | ICD-10-CM | POA: Diagnosis present

## 2023-11-22 DIAGNOSIS — I5021 Acute systolic (congestive) heart failure: Secondary | ICD-10-CM | POA: Diagnosis present

## 2023-11-22 DIAGNOSIS — I130 Hypertensive heart and chronic kidney disease with heart failure and stage 1 through stage 4 chronic kidney disease, or unspecified chronic kidney disease: Secondary | ICD-10-CM | POA: Diagnosis present

## 2023-11-22 DIAGNOSIS — I361 Nonrheumatic tricuspid (valve) insufficiency: Secondary | ICD-10-CM | POA: Diagnosis not present

## 2023-11-22 DIAGNOSIS — I2510 Atherosclerotic heart disease of native coronary artery without angina pectoris: Secondary | ICD-10-CM | POA: Diagnosis present

## 2023-11-22 DIAGNOSIS — I272 Pulmonary hypertension, unspecified: Secondary | ICD-10-CM | POA: Diagnosis present

## 2023-11-22 DIAGNOSIS — I340 Nonrheumatic mitral (valve) insufficiency: Secondary | ICD-10-CM | POA: Diagnosis not present

## 2023-11-22 DIAGNOSIS — Z515 Encounter for palliative care: Secondary | ICD-10-CM | POA: Diagnosis not present

## 2023-11-22 DIAGNOSIS — E119 Type 2 diabetes mellitus without complications: Secondary | ICD-10-CM | POA: Diagnosis not present

## 2023-11-22 DIAGNOSIS — I5023 Acute on chronic systolic (congestive) heart failure: Secondary | ICD-10-CM | POA: Diagnosis not present

## 2023-11-22 DIAGNOSIS — F1090 Alcohol use, unspecified, uncomplicated: Secondary | ICD-10-CM | POA: Diagnosis not present

## 2023-11-22 DIAGNOSIS — I5082 Biventricular heart failure: Secondary | ICD-10-CM | POA: Diagnosis present

## 2023-11-22 DIAGNOSIS — N1832 Chronic kidney disease, stage 3b: Secondary | ICD-10-CM | POA: Diagnosis present

## 2023-11-22 DIAGNOSIS — G9340 Encephalopathy, unspecified: Secondary | ICD-10-CM | POA: Diagnosis not present

## 2023-11-22 DIAGNOSIS — R7989 Other specified abnormal findings of blood chemistry: Secondary | ICD-10-CM | POA: Diagnosis not present

## 2023-11-22 LAB — COMPREHENSIVE METABOLIC PANEL WITH GFR
ALT: 19 U/L (ref 0–44)
AST: 30 U/L (ref 15–41)
Albumin: 2.6 g/dL — ABNORMAL LOW (ref 3.5–5.0)
Alkaline Phosphatase: 48 U/L (ref 38–126)
Anion gap: 11 (ref 5–15)
BUN: 19 mg/dL (ref 8–23)
CO2: 20 mmol/L — ABNORMAL LOW (ref 22–32)
Calcium: 8.6 mg/dL — ABNORMAL LOW (ref 8.9–10.3)
Chloride: 104 mmol/L (ref 98–111)
Creatinine, Ser: 1.09 mg/dL — ABNORMAL HIGH (ref 0.44–1.00)
GFR, Estimated: 53 mL/min — ABNORMAL LOW (ref >60)
Glucose, Bld: 171 mg/dL — ABNORMAL HIGH (ref 70–99)
Potassium: 3.5 mmol/L (ref 3.5–5.1)
Sodium: 135 mmol/L (ref 135–145)
Total Bilirubin: 1.6 mg/dL — ABNORMAL HIGH (ref 0.0–1.2)
Total Protein: 6.1 g/dL — ABNORMAL LOW (ref 6.5–8.1)

## 2023-11-22 LAB — BASIC METABOLIC PANEL WITH GFR
Anion gap: 13 (ref 5–15)
BUN: 20 mg/dL (ref 8–23)
CO2: 21 mmol/L — ABNORMAL LOW (ref 22–32)
Calcium: 9 mg/dL (ref 8.9–10.3)
Chloride: 104 mmol/L (ref 98–111)
Creatinine, Ser: 1.3 mg/dL — ABNORMAL HIGH (ref 0.44–1.00)
GFR, Estimated: 43 mL/min — ABNORMAL LOW (ref >60)
Glucose, Bld: 168 mg/dL — ABNORMAL HIGH (ref 70–99)
Potassium: 4.6 mmol/L (ref 3.5–5.1)
Sodium: 138 mmol/L (ref 135–145)

## 2023-11-22 LAB — GLUCOSE, CAPILLARY
Glucose-Capillary: 150 mg/dL — ABNORMAL HIGH (ref 70–99)
Glucose-Capillary: 171 mg/dL — ABNORMAL HIGH (ref 70–99)
Glucose-Capillary: 147 mg/dL — ABNORMAL HIGH (ref 70–99)
Glucose-Capillary: 155 mg/dL — ABNORMAL HIGH (ref 70–99)

## 2023-11-22 LAB — HEPATITIS B SURFACE ANTIGEN: Hepatitis B Surface Ag: NONREACTIVE

## 2023-11-22 LAB — RPR: RPR Ser Ql: NONREACTIVE

## 2023-11-22 LAB — CBC
HCT: 30.4 % — ABNORMAL LOW (ref 36.0–46.0)
Hemoglobin: 9.5 g/dL — ABNORMAL LOW (ref 12.0–15.0)
MCH: 22.1 pg — ABNORMAL LOW (ref 26.0–34.0)
MCHC: 31.3 g/dL (ref 30.0–36.0)
MCV: 70.9 fL — ABNORMAL LOW (ref 80.0–100.0)
Platelets: 195 K/uL (ref 150–400)
RBC: 4.29 MIL/uL (ref 3.87–5.11)
RDW: 19.5 % — ABNORMAL HIGH (ref 11.5–15.5)
WBC: 7.6 K/uL (ref 4.0–10.5)
nRBC: 0 % (ref 0.0–0.2)

## 2023-11-22 LAB — HEPATITIS B SURFACE ANTIBODY,QUALITATIVE: Hep B S Ab: NONREACTIVE

## 2023-11-22 LAB — LIPID PANEL
Cholesterol: 117 mg/dL (ref 0–200)
HDL: 31 mg/dL — ABNORMAL LOW (ref >40)
LDL Cholesterol: 77 mg/dL (ref 0–99)
Total CHOL/HDL Ratio: 3.8 ratio
Triglycerides: 45 mg/dL (ref <150)
VLDL: 9 mg/dL (ref 0–40)

## 2023-11-22 LAB — HIV ANTIBODY (ROUTINE TESTING W REFLEX): HIV Screen 4th Generation wRfx: NONREACTIVE

## 2023-11-22 LAB — HEPATITIS A ANTIBODY, TOTAL: hep A Total Ab: REACTIVE — AB

## 2023-11-22 LAB — MAGNESIUM
Magnesium: 1.6 mg/dL — ABNORMAL LOW (ref 1.7–2.4)
Magnesium: 1.9 mg/dL (ref 1.7–2.4)

## 2023-11-22 MED ORDER — LORAZEPAM 2 MG/ML IJ SOLN
1.0000 mg | INTRAMUSCULAR | Status: DC | PRN
  Administered 2023-11-23: 2 mg via INTRAVENOUS
  Filled 2023-11-22: qty 1

## 2023-11-22 MED ORDER — FUROSEMIDE 10 MG/ML IJ SOLN
40.0000 mg | Freq: Two times a day (BID) | INTRAMUSCULAR | Status: AC
  Administered 2023-11-22 (×2): 40 mg via INTRAVENOUS
  Filled 2023-11-22 (×2): qty 4

## 2023-11-22 MED ORDER — SACUBITRIL-VALSARTAN 24-26 MG PO TABS
1.0000 | ORAL_TABLET | Freq: Two times a day (BID) | ORAL | Status: DC
  Administered 2023-11-22 – 2023-11-25 (×6): 1 via ORAL
  Filled 2023-11-22 (×6): qty 1

## 2023-11-22 MED ORDER — POTASSIUM CHLORIDE CRYS ER 20 MEQ PO TBCR
40.0000 meq | EXTENDED_RELEASE_TABLET | Freq: Once | ORAL | Status: AC
  Administered 2023-11-22: 40 meq via ORAL
  Filled 2023-11-22: qty 2

## 2023-11-22 MED ORDER — LORAZEPAM 1 MG PO TABS
1.0000 mg | ORAL_TABLET | ORAL | Status: DC | PRN
  Administered 2023-11-22 – 2023-11-23 (×2): 2 mg via ORAL
  Filled 2023-11-22 (×2): qty 2

## 2023-11-22 MED ORDER — MAGNESIUM SULFATE 4 GM/100ML IV SOLN
4.0000 g | Freq: Once | INTRAVENOUS | Status: AC
  Administered 2023-11-22: 4 g via INTRAVENOUS
  Filled 2023-11-22: qty 100

## 2023-11-22 NOTE — Progress Notes (Signed)
 HD#0 SUBJECTIVE:  Patient Summary: Emma Hahn is a 73 y.o. female with PMH of polysubtance use disorder, chronic combined systolic and diastolic heart failure, COPD, chronic kidney disease stage 3b, substance use disorder, recurrent hospitalizations, who was recently discharged from [facility] [place]  [facility] for exacerbation of her chronic HF. Found in [place] confused, brought to ED and admitted for acute on chronic HFE.   Overnight Events: none  Interim History: Very sleepy on exam, did not communicate much. Still having SOB, no chest pain. The sheet seems to be causing her legs pain. No abdominal pain. Limited interview.   OBJECTIVE:  Vital Signs: Vitals:   11/21/23 1553 11/21/23 1947 11/22/23 0004 11/22/23 0404  BP: 126/76 135/79 122/68 (!) 134/91  Pulse:  (!) 102 (!) 105 (!) 101  Resp:  20 20 20   Temp:  98.3 F (36.8 C) 98.1 F (36.7 C) 98.1 F (36.7 C)  TempSrc:  Oral Oral Oral  SpO2:  100% 98% 98%  Weight:    64.8 kg  Height:       Supplemental O2: Room Air SpO2: 98 %  Filed Weights   11/21/23 0757 11/22/23 0404  Weight: 63.5 kg 64.8 kg     Intake/Output Summary (Last 24 hours) at 11/22/2023 0554 Last data filed at 11/22/2023 0408 Gross per 24 hour  Intake 460 ml  Output 100 ml  Net 360 ml  RN bed soaked at beginning of shift Net IO Since Admission: 360 mL [11/22/23 0554]  Physical Exam: Physical Exam Vitals reviewed.  Constitutional:      Appearance: She is not toxic-appearing.     Comments: Asleep, able to arouse but quickly back to sleep Appears comfortable   HENT:     Head: Normocephalic.     Nose: Nose normal.  Cardiovascular:     Rate and Rhythm: Normal rate and regular rhythm.     Pulses: Normal pulses.     Heart sounds: Normal heart sounds.     Comments: JVD to mandible  Pulmonary:     Effort: Pulmonary effort is normal.  Abdominal:     General: Bowel sounds are normal.     Palpations: Abdomen is soft.  Musculoskeletal:         General: Tenderness present.     Comments: BL LE 1+ pitting edema  Skin:    General: Skin is warm.     Patient Lines/Drains/Airways Status     Active Line/Drains/Airways     Name Placement date Placement time Site Days   Peripheral IV 11/21/23 20 G Right Antecubital 11/21/23  1210  Antecubital  1   External Urinary Catheter 11/21/23  1949  --  1            Pertinent labs and imaging:      Latest Ref Rng & Units 11/22/2023    2:42 AM 11/21/2023    8:15 AM 03/06/2023    2:51 AM  CBC  WBC 4.0 - 10.5 K/uL 7.6  8.3  11.1   Hemoglobin 12.0 - 15.0 g/dL 9.5  88.8  9.9   Hematocrit 36.0 - 46.0 % 30.4  36.4  31.2   Platelets 150 - 400 K/uL 195  254  273        Latest Ref Rng & Units 11/22/2023    2:42 AM 11/21/2023    7:08 PM 11/21/2023    8:15 AM  CMP  Glucose 70 - 99 mg/dL 828  823  856   BUN 8 -  23 mg/dL 19  18  23    Creatinine 0.44 - 1.00 mg/dL 8.90  8.79  8.83   Sodium 135 - 145 mmol/L 135  138  138   Potassium 3.5 - 5.1 mmol/L 3.5  3.7  4.3   Chloride 98 - 111 mmol/L 104  106  103   CO2 22 - 32 mmol/L 20  22  22    Calcium  8.9 - 10.3 mg/dL 8.6  8.7  9.0   Total Protein 6.5 - 8.1 g/dL 6.1   6.9   Total Bilirubin 0.0 - 1.2 mg/dL 1.6   1.7   Alkaline Phos 38 - 126 U/L 48   48   AST 15 - 41 U/L 30   38   ALT 0 - 44 U/L 19   22     CT Head Wo Contrast Result Date: 11/21/2023 EXAM: CT HEAD WITHOUT CONTRAST 11/21/2023 08:45:41 AM TECHNIQUE: CT of the head was performed without the administration of intravenous contrast. Automated exposure control, iterative reconstruction, and/or weight based adjustment of the mA/kV was utilized to reduce the radiation dose to as low as reasonably achievable. COMPARISON: None available. CLINICAL HISTORY: 73 year old female. Mental status change, unknown cause. FINDINGS: BRAIN AND VENTRICLES: Brain volume within normal limits for age. Mild for age patchy white matter hypodensity, mostly in the deep white matter capsules. Small but  circumscribed chronic lacunar infarct of the right basal ganglia superimposed. No suspicious intracranial vascular hyperdensity. No acute hemorrhage. No evidence of acute infarct. No hydrocephalus. No extra-axial collection. No mass effect or midline shift. ORBITS: No acute abnormality. SINUSES: Paranasal sinuses, middle ears and mastoids well aerated. SOFT TISSUES AND SKULL: Calcified atherosclerosis at the skull base. No acute soft tissue abnormality. No skull fracture. IMPRESSION: 1. No acute intracranial abnormality. 2. Mild for age chronic small vessel disease. Electronically signed by: Helayne Hurst MD 11/21/2023 08:57 AM EST RP Workstation: [id number]   DG Chest 2 View Result Date: 11/21/2023 EXAM: 2 VIEW(S) XRAY OF THE CHEST 11/21/2023 08:29:00 AM COMPARISON: 03/04/2023 CLINICAL HISTORY: CHF FINDINGS: LUNGS AND PLEURA: Probable mild bilateral pulmonary edema is noted. Small pleural effusions are present. No focal pulmonary opacity. No pneumothorax. HEART AND MEDIASTINUM: Stable cardiomegaly. BONES AND SOFT TISSUES: No acute osseous abnormality. IMPRESSION: 1. Stable cardiomegaly. 2. Findings compatible with mild pulmonary edema. 3. Small pleural effusions. Electronically signed by: Lynwood Seip MD 11/21/2023 08:34 AM EST RP Workstation: [id number]    ASSESSMENT/PLAN:  Assessment: Principal Problem:   Acute exacerbation of CHF (congestive heart failure) (HCC) Active Problems:   Cocaine use   Plan: Emma Hahn is a 73 y.o. female with PMH of polysubtance use disorder, chronic combined systolic and diastolic heart failure, COPD, chronic kidney disease stage 3b, substance use disorder, recurrent hospitalizations, who was recently discharged from [facility] [place]  [facility] for exacerbation of her chronic HF. Found in [place], taken to ED and admitted for AoCHFE.   Acute on chronic biventricular sytolic heart failure Nonischemic cardiomyopathy per [facility] in 2021 10/2023 TTE with LVEF  20-25% and severe global hypokinesis. No valvular disease. Improved output through out the day - , some that went unrecorded overnight. Will continue IV diuresis. Hold home BB and SGLT2i until discharge, on metoprolol  at home, consider switching to nonselective BB in the setting of cocaine use disorder. D/c losartan , replaced with her home entresto  BID. PT/OT recommending SNF  - Strict I/Os and weight daily - entresto  24-26 mg BID  - Add beta blocker and Jardiance  10 mg before  discharge - Goal K >4, Mg >2   Polysubstance use disorder CIWA with ativan  CIWA 7, given 1 mg IV at 2055. Low concern for w/d today on exam - Thiamine  and folate supplementation - Monitor for refeeding syndrome   COPD Current smoker Low concern for exacerbation.  - SPO2 goal 89 -92% - PTA inhalers added  HTN HLD Last lipid panel 02/2023, will repeat, consider increasing atorvastatin   - atorvastatin  40 mg - Entresto     CKD 3b Bc 1.3 per records. Cr 1.09 today - Strict in/outs - avoiding nephrotoxic medications to allow for HFrEF GDMT   Hx Hepatitis C, untreated Hepatitis B non-immune, consider vaccinating.  -pending HCV   Code documentation Palliative care consulted during last admission in [place] . DNR/ DNI per 11/07/2023 consult done with Palliative care provider Dr. Ivan Zama in [place] . Patient does not want BiPAP or cPAP per that discussion. See in chart. Reiterated this information on admission  Best Practice: Diet: Cardiac diet IVF: Fluids: none, Rate: None VTE: rivaroxaban (XARELTO) tablet 10 mg Start: 11/21/23 2200 Place TED hose Start: 11/21/23 1642 SCDs Start: 11/21/23 1258 Code: DNR  Disposition planning: Therapy Recs: Pending, DME:  DISPO: Anticipated discharge in 1-2 days to Skilled nursing facility pending clinical improvement and SNF placement.  Signature:  Viktoria Charmayne [facility]  5:54 AM, 11/22/2023  On Call pager [phone]

## 2023-11-22 NOTE — NC FL2 (Signed)
   MEDICAID FL2 LEVEL OF CARE FORM     IDENTIFICATION  Patient Name: Emma Hahn Birthdate: 09/24/1950 Sex: female Admission Date (Current Location): 11/21/2023  [facility] [place] Number:  Producer, Television/film/video and Address:  [facility] [place], [address] [place], [place] [address]      Provider Number: [id number]  Attending Physician Name and Address:  Eben Reyes BROCKS, MD  Relative Name and Phone Number:  Alexa Pao (Daughter)  [phone]    Current Level of Care: Hospital Recommended Level of Care: Skilled Nursing Facility Prior Approval Number:    Date Approved/Denied:   PASRR Number: [id number]  Discharge Plan: SNF    Current Diagnoses: Patient Active Problem List   Diagnosis Date Noted   Cocaine abuse (HCC) 11/22/2023   Cocaine use 11/21/2023   Hypomagnesemia 03/05/2023   HTN (hypertension) 03/05/2023   Hyperlipidemia 03/05/2023   Acute pulmonary edema (HCC) 03/04/2023   COPD with acute exacerbation (HCC) 03/04/2023   AKI (acute kidney injury)    Acute congestive heart failure (HCC) 07/11/2021   MR (mitral regurgitation) 07/11/2021   Financial difficulties 12/10/2019   Alcohol use 12/09/2019   Acute respiratory failure with hypoxia (HCC) 12/08/2019   Acute on chronic systolic CHF (congestive heart failure) (HCC) 12/06/2019   Tobacco abuse 12/06/2019   Prolonged QT interval 12/06/2019   Chest pain 12/06/2019   Elevated troponin 12/06/2019   Prediabetes 12/06/2019   Do not resuscitate 12/06/2019    Orientation RESPIRATION BLADDER Height & Weight     Self, Time, Situation, Place  Normal Incontinent, External catheter Weight: 142 lb 13.7 oz (64.8 kg) Height:  5' 4 (162.6 cm)  BEHAVIORAL SYMPTOMS/MOOD NEUROLOGICAL BOWEL NUTRITION STATUS      Continent Diet (Please see discharge summary)  AMBULATORY STATUS COMMUNICATION OF NEEDS Skin   Limited Assist Verbally Normal                       Personal Care  Assistance Level of Assistance  Bathing, Feeding, Dressing Bathing Assistance:  (Please see discharge summary) Feeding assistance:  (Please see discharge summary) Dressing Assistance:  (Please see discharge summary)     Functional Limitations Info  Sight, Hearing, Speech Sight Info: Adequate Hearing Info: Adequate Speech Info: Adequate    SPECIAL CARE FACTORS FREQUENCY  PT (By licensed PT), OT (By licensed OT)     PT Frequency: 5x week OT Frequency: 5x week            Contractures Contractures Info: Not present    Additional Factors Info  Code Status, Allergies, Insulin Sliding Scale Code Status Info: DNR limited Allergies Info: NKA   Insulin Sliding Scale Info: Please see discharge summary       Current Medications (11/22/2023):  This is the current hospital active medication list Current Facility-Administered Medications  Medication Dose Route Frequency Provider Last Rate Last Admin   acetaminophen  (TYLENOL ) tablet 650 mg  650 mg Oral Q6H PRN Elnora Ip, MD       Or   acetaminophen  (TYLENOL ) suppository 650 mg  650 mg Rectal Q6H PRN Elnora Ip, MD       albuterol  (PROVENTIL ) (2.5 MG/3ML) 0.083% nebulizer solution 2.5 mg  2.5 mg Nebulization Q2H PRN Elnora Ip, MD       atorvastatin  (LIPITOR) tablet 40 mg  40 mg Oral Daily Gomez-Caraballo, Maria, MD   40 mg at 11/22/23 1055   fluticasone  furoate-vilanterol (BREO ELLIPTA) 100-25 MCG/ACT 1 puff  1 puff  Inhalation Daily Gomez-Caraballo, Maria, MD   1 puff at 11/22/23 0855   furosemide  (LASIX ) injection 40 mg  40 mg Intravenous Q12H Prunty, Donald B, DO   40 mg at 11/22/23 1055   insulin aspart (novoLOG) injection 0-5 Units  0-5 Units Subcutaneous QHS Gomez-Caraballo, Maria, MD       insulin aspart (novoLOG) injection 0-9 Units  0-9 Units Subcutaneous TID WC Gomez-Caraballo, Maria, MD   2 Units at 11/22/23 1300   LORazepam  (ATIVAN ) tablet 1-4 mg  1-4 mg Oral Q1H PRN Kandis Perkins, DO        Or   LORazepam  (ATIVAN ) injection 1-4 mg  1-4 mg Intravenous Q1H PRN Bender, Emily, DO       nicotine (NICODERM CQ - dosed in mg/24 hr) patch 7 mg  7 mg Transdermal Daily Gomez-Caraballo, Maria, MD   7 mg at 11/22/23 1056   rivaroxaban (XARELTO) tablet 10 mg  10 mg Oral Daily Gomez-Caraballo, Maria, MD   10 mg at 11/21/23 2100   sacubitril -valsartan  (ENTRESTO ) 24-26 mg per tablet  1 tablet Oral BID Charmayne Holmes, DO   1 tablet at 11/22/23 1055   senna-docusate (Senokot-S) tablet 1 tablet  1 tablet Oral QHS PRN Gomez-Caraballo, Maria, MD       thiamine  (VITAMIN B1) tablet 100 mg  100 mg Oral Daily Young, Travis J, DO   100 mg at 11/22/23 1055   Or   thiamine  (VITAMIN B1) injection 100 mg  100 mg Intravenous Daily Neysa Caron PARAS, DO         Discharge Medications: Please see discharge summary for a list of discharge medications.  Relevant Imaging Results:  Relevant Lab Results:   Additional Information SSN [id number]  Luise JAYSON Pan, LCSWA

## 2023-11-22 NOTE — Evaluation (Addendum)
 Physical Therapy Evaluation Patient Details Name: Berlinda Farve MRN: [id number] DOB: 06-Aug-1950 Today's Date: 11/22/2023  History of Present Illness  73 yo F adm 11/21/23 with AMS, SOB after cocaine use.PMHx: HLD, HTN, COPD, CHF, polysubstance abuse, CKD  Clinical Impression  Pt in bed on arrival able to converse and asking to be smoothed (pt wanted lotion for legs after some clarification). Pt oriented to self, decreased recall of cues and needing mod assist and cues to perform transfers and mobility. Pt stating she can stay with friends but no one available to confirm that. Pt with decreased cognition, balance, mobility and safety who will benefit from acute therapy to maximize mobility and decrease burden of care. Patient will benefit from continued inpatient follow up therapy, <3 hours/day  HR 106-122 sPO2 93-95% on RA         If plan is discharge home, recommend the following: A little help with walking and/or transfers;A little help with bathing/dressing/bathroom;Assistance with cooking/housework;Direct supervision/assist for financial management;Supervision due to cognitive status;Assist for transportation   Can travel by private vehicle   Yes    Equipment Recommendations Rolling walker (2 wheels)  Recommendations for Other Services  OT consult    Functional Status Assessment Patient has had a recent decline in their functional status and demonstrates the ability to make significant improvements in function in a reasonable and predictable amount of time.     Precautions / Restrictions Precautions Precautions: Fall Recall of Precautions/Restrictions: Impaired      Mobility  Bed Mobility Overal bed mobility: Needs Assistance Bed Mobility: Supine to Sit, Sit to Supine     Supine to sit: Min assist Sit to supine: Min assist   General bed mobility comments: min assist to rise from surface with mod cues for initiation and sequence. assist to lift legs to return to  bed with max cues    Transfers Overall transfer level: Needs assistance   Transfers: Sit to/from Stand Sit to Stand: Min assist           General transfer comment: min assist to rise from bed x 2 trials as pt immediately sit after first stand, cues for hand placement, assist to power up and increased time to transition from propping on forearm on RW to upright    Ambulation/Gait Ambulation/Gait assistance: Min assist Gait Distance (Feet): 80 Feet Assistive device: Rolling walker (2 wheels) Gait Pattern/deviations: Step-through pattern, Decreased stride length, Trunk flexed   Gait velocity interpretation: <1.8 ft/sec, indicate of risk for recurrent falls   General Gait Details: mod cues for safety, sequence, proximity to RW. Pt initially stopped after 30' stating she needed to rest, turned around to room and pt then walked past room stating she needed to move. Pt with impaired safety and awareness needing mod cues and direction with assist to control RW  Stairs            Wheelchair Mobility     Tilt Bed    Modified Rankin (Stroke Patients Only)       Balance Overall balance assessment: Needs assistance Sitting-balance support: No upper extremity supported, Feet supported Sitting balance-Leahy Scale: Fair     Standing balance support: Bilateral upper extremity supported, Reliant on assistive device for balance, During functional activity Standing balance-Leahy Scale: Poor Standing balance comment: Rw in standing                             Pertinent Vitals/Pain Pain Assessment Pain  Assessment: No/denies pain    Home Living Family/patient expects to be discharged to:: Unsure                   Additional Comments: pt chart pt was living with daughter in [place] but no longer has housing, pt unable to provide    Prior Function Prior Level of Function : Independent/Modified Independent;Driving                     Extremity/Trunk  Assessment   Upper Extremity Assessment Upper Extremity Assessment: Generalized weakness    Lower Extremity Assessment Lower Extremity Assessment: Generalized weakness    Cervical / Trunk Assessment Cervical / Trunk Assessment: Normal  Communication   Communication Communication: No apparent difficulties    Cognition Arousal: Lethargic Behavior During Therapy: Flat affect   PT - Cognitive impairments: Safety/Judgement, Memory, Orientation, Problem solving, Awareness   Orientation impairments: Time, Situation, Place                   PT - Cognition Comments: pt initially stating [place] for location but then adament we were in [place]. Pt oriented to self, limited attention and memory with significantly impaired safety. required repeated cues and time to don socks Following commands: Impaired Following commands impaired: Follows one step commands inconsistently, Follows one step commands with increased time     Cueing Cueing Techniques: Verbal cues, Gestural cues, Tactile cues     General Comments      Exercises     Assessment/Plan    PT Assessment Patient needs continued PT services  PT Problem List Decreased strength;Decreased balance;Decreased activity tolerance;Decreased mobility;Decreased safety awareness;Decreased knowledge of use of DME;Decreased cognition;Decreased coordination       PT Treatment Interventions DME instruction;Balance training;Gait training;Functional mobility training;Therapeutic activities;Cognitive remediation;Patient/family education;Therapeutic exercise    PT Goals (Current goals can be found in the Care Plan section)  Acute Rehab PT Goals PT Goal Formulation: Patient unable to participate in goal setting Time For Goal Achievement: 12/06/23 Potential to Achieve Goals: Fair    Frequency Min 2X/week     Co-evaluation               AM-PAC PT 6 Clicks Mobility  Outcome Measure Help needed turning from your back to your side  while in a flat bed without using bedrails?: A Little Help needed moving from lying on your back to sitting on the side of a flat bed without using bedrails?: A Little Help needed moving to and from a bed to a chair (including a wheelchair)?: A Little Help needed standing up from a chair using your arms (e.g., wheelchair or bedside chair)?: A Little Help needed to walk in hospital room?: A Lot Help needed climbing 3-5 steps with a railing? : Total 6 Click Score: 15    End of Session Equipment Utilized During Treatment: Gait belt Activity Tolerance: Patient tolerated treatment well Patient left: in bed;with call bell/phone within reach;with bed alarm set Nurse Communication: Mobility status PT Visit Diagnosis: Other abnormalities of gait and mobility (R26.89);Difficulty in walking, not elsewhere classified (R26.2)    Time: 9069-9048 PT Time Calculation (min) (ACUTE ONLY): 21 min   Charges:   PT Evaluation $PT Eval Moderate Complexity: 1 Mod   PT General Charges $$ ACUTE PT VISIT: 1 Visit         Lenoard SQUIBB, PT Acute Rehabilitation Services Office: [phone]   Lenoard NOVAK Daniell Mancinas 11/22/2023, 10:45 AM

## 2023-11-22 NOTE — Progress Notes (Signed)
 OT Cancellation Note  Patient Details Name: Emma Hahn MRN: [id number] DOB: 05-Jun-1950   Cancelled Treatment:    Reason Eval/Treat Not Completed: Fatigue/lethargy limiting ability to participate;Other (comment) Pt lethargic and unable to sustain eyes opened on first OT attempt this AM. On second attempt, pt with lunch. Will follow up for OT eval as schedule permits.  Mliss Fish 11/22/2023, 12:19 PM

## 2023-11-22 NOTE — Evaluation (Signed)
 Occupational Therapy Evaluation Patient Details Name: Emma Hahn MRN: [id number] DOB: 1950-04-22 Today's Date: 11/22/2023   History of Present Illness   73 yo F adm 11/21/23 with AMS, SOB after cocaine use.PMHx: HLD, HTN, COPD, CHF, polysubstance abuse, CKD     Clinical Impressions PTA, per chart, pt with housing insecurity. Pt reports typically Independent with ADLs and mobility without need for AD. Pt presents now with deficits in cognition, balance, strength and endurance. Pt also limited by lethargy, which has been ongoing today. Pt requires Min A for BSC transfers, Min A for UB ADLs and Max A for LB ADLs. Based on current deficits and no consistent support in community, recommend continued inpatient follow up therapy, <3 hours/day at [place].     If plan is discharge home, recommend the following:   A lot of help with bathing/dressing/bathroom;A little help with walking and/or transfers;Assistance with cooking/housework;Direct supervision/assist for medications management;Direct supervision/assist for financial management;Assist for transportation     Functional Status Assessment   Patient has had a recent decline in their functional status and demonstrates the ability to make significant improvements in function in a reasonable and predictable amount of time.     Equipment Recommendations   Other (comment) (TBD)     Recommendations for Other Services         Precautions/Restrictions   Precautions Precautions: Fall Recall of Precautions/Restrictions: Impaired Restrictions Weight Bearing Restrictions Per Provider Order: No     Mobility Bed Mobility Overal bed mobility: Needs Assistance Bed Mobility: Supine to Sit, Sit to Supine     Supine to sit: Min assist Sit to supine: Min assist        Transfers Overall transfer level: Needs assistance Equipment used: 1 person hand held assist Transfers: Sit to/from Stand, Bed to chair/wheelchair/BSC Sit to  Stand: Min assist     Step pivot transfers: Min assist     General transfer comment: to/from [facility], pt reaching for BSC armrest, cues to sequence steps      Balance Overall balance assessment: Needs assistance Sitting-balance support: No upper extremity supported, Feet supported Sitting balance-Leahy Scale: Fair     Standing balance support: Bilateral upper extremity supported, Reliant on assistive device for balance, During functional activity Standing balance-Leahy Scale: Poor                             ADL either performed or assessed with clinical judgement   ADL Overall ADL's : Needs assistance/impaired Eating/Feeding: Set up;Bed level   Grooming: Minimal assistance;Sitting   Upper Body Bathing: Minimal assistance;Sitting   Lower Body Bathing: Maximal assistance;Sitting/lateral leans;Sit to/from stand   Upper Body Dressing : Minimal assistance;Sitting   Lower Body Dressing: Maximal assistance;Sitting/lateral leans;Sit to/from stand Lower Body Dressing Details (indicate cue type and reason): assist to doff/don clean mesh underwear Toilet Transfer: Minimal assistance;Stand-pivot;BSC/3in1 Toilet Transfer Details (indicate cue type and reason): assist to stand and safely sequence steps to/from [facility] Toileting- Clothing Manipulation and Hygiene: Sit to/from stand;Sitting/lateral lean;Moderate assistance Toileting - Clothing Manipulation Details (indicate cue type and reason): assist for balance in standing, cues for problem solving peri care with current gowns on and assist to manage mesh underwear             Vision Ability to See in Adequate Light: 0 Adequate Patient Visual Report: No change from baseline Vision Assessment?: No apparent visual deficits Additional Comments: appears [facility]     Perception  Praxis         Pertinent Vitals/Pain Pain Assessment Pain Assessment: No/denies pain     Extremity/Trunk Assessment Upper Extremity  Assessment Upper Extremity Assessment: Generalized weakness;Right hand dominant   Lower Extremity Assessment Lower Extremity Assessment: Defer to PT evaluation   Cervical / Trunk Assessment Cervical / Trunk Assessment: Normal   Communication Communication Communication: No apparent difficulties   Cognition Arousal: Lethargic Behavior During Therapy: Flat affect Cognition: Cognition impaired, Difficult to assess Difficult to assess due to: Level of arousal Orientation impairments: Situation Awareness: Intellectual awareness impaired, Online awareness impaired Memory impairment (select all impairments): Short-term memory, Working civil service fast streamer, Conservation officer, historic buildings Attention impairment (select first level of impairment): Selective attention, Sustained attention Executive functioning impairment (select all impairments): Sequencing, Organization, Problem solving OT - Cognition Comments: lethargic but awakens easily. cues and assist to sequence ADLs. decreased overall awareness but was surprised to look at clock and see what time it was. did also note that gown was wet and agreeable for it to be changed                 Following commands: Impaired Following commands impaired: Follows one step commands inconsistently, Follows one step commands with increased time     Cueing  General Comments   Cueing Techniques: Verbal cues;Gestural cues;Tactile cues      Exercises     Shoulder Instructions      Home Living Family/patient expects to be discharged to:: Unsure                                 Additional Comments: pt chart pt was living with daughter in [place] but no longer has housing, pt unable to provide. noted a [place] apartment address in system      Prior Functioning/Environment Prior Level of Function : Independent/Modified Independent;Patient poor historian/Family not available             Mobility Comments: no AD ADLs Comments: pt reports  able to manage ADLs    OT Problem List: Decreased strength;Decreased activity tolerance;Impaired balance (sitting and/or standing);Decreased cognition;Decreased safety awareness   OT Treatment/Interventions: Self-care/ADL training;Therapeutic exercise;Energy conservation;DME and/or AE instruction;Therapeutic activities;Patient/family education;Balance training      OT Goals(Current goals can be found in the care plan section)   Acute Rehab OT Goals Patient Stated Goal: go back to sleep OT Goal Formulation: Patient unable to participate in goal setting Time For Goal Achievement: 12/06/23 Potential to Achieve Goals: Good   OT Frequency:  Min 2X/week    Co-evaluation              AM-PAC OT 6 Clicks Daily Activity     Outcome Measure Help from another person eating meals?: None Help from another person taking care of personal grooming?: A Little Help from another person toileting, which includes using toliet, bedpan, or urinal?: A Lot Help from another person bathing (including washing, rinsing, drying)?: A Lot Help from another person to put on and taking off regular upper body clothing?: A Little Help from another person to put on and taking off regular lower body clothing?: A Lot 6 Click Score: 16   End of Session Nurse Communication: Mobility status;Other (comment) (RN in to assist with IV as well)  Activity Tolerance: Patient limited by lethargy Patient left: in bed;with call bell/phone within reach;with bed alarm set  OT Visit Diagnosis: Unsteadiness on feet (R26.81);Other abnormalities of gait and mobility (R26.89);Muscle weakness (  generalized) (M62.81)                Time: 8669-8643 OT Time Calculation (min): 26 min Charges:  OT General Charges $OT Visit: 1 Visit OT Evaluation $OT Eval Moderate Complexity: 1 Mod OT Treatments $Self Care/Home Management : 8-22 mins  Mliss NOVAK, OTR/L Acute Rehab Services Office: [phone]   Mliss Fish 11/22/2023, 2:07  PM

## 2023-11-22 NOTE — TOC CM/SW Note (Addendum)
 Transition of Care (TOC) CM/SW Note   CSW addressed SDOH and substance use consult with patient. CSW provided patient with resources.   1:24 PM Patient Ox4, CSW met patient at bedside and discussed PT recs for SNF. Patient unsure if she wants to go to SNF or not. Patient agreeable to CSW doing SNF workup.   4:33 PM CSW provided patient medicare.gov rating for only accepting SNF bed offer [facility]).   Emma Hahn, MSW, LCSWA Transitions of Care [phone]

## 2023-11-22 NOTE — Progress Notes (Signed)
 Heart Failure Navigator Progress Note  Assessed for Heart & Vascular TOC clinic readiness.  Patient does not meet criteria due to she is an patient of the Advanced Heart Failure Team with Dr. Bensimhon. .   Navigator will sign off at this time.,,   Stephane Haddock, BSN, RN Heart Failure Teacher, Adult Education Only

## 2023-11-22 NOTE — Care Management Obs Status (Signed)
 MEDICARE OBSERVATION STATUS NOTIFICATION   Patient Details  Name: Emma Hahn MRN: [id number] Date of Birth: 31-Oct-1950   Medicare Observation Status Notification Given:  Yes    Vonzell Arrie Sharps 11/22/2023, 12:18 PM

## 2023-11-22 NOTE — Plan of Care (Signed)
  Problem: Education: Goal: Ability to describe self-care measures that may prevent or decrease complications (Diabetes Survival Skills Education) will improve Outcome: Progressing   Problem: Fluid Volume: Goal: Ability to maintain a balanced intake and output will improve Outcome: Progressing   Problem: Skin Integrity: Goal: Risk for impaired skin integrity will decrease Outcome: Progressing   Problem: Tissue Perfusion: Goal: Adequacy of tissue perfusion will improve Outcome: Progressing   Problem: Clinical Measurements: Goal: Diagnostic test results will improve Outcome: Progressing

## 2023-11-22 NOTE — Telephone Encounter (Signed)
 Patient Product/process Development Scientist completed.    The patient is insured through [employer]. Patient has Medicare and is not eligible for a copay card, but may be able to apply for patient assistance or Medicare RX Payment Plan (Patient Must reach out to their plan, if eligible for payment plan), if available.    Ran test claim for Jardiance  10 mg and the current 30 day co-pay is $12.15.  Ran test claim for sacubitril -valsartan  24-26 mg and the current 30 day co-pay is $4.90  This test claim was processed through [employer]- copay amounts may vary at other pharmacies due to [employer], or as the patient moves through the different stages of their insurance plan.     Reyes Sharps, CPHT Pharmacy Technician Patient Advocate Specialist Lead [facility] Patient Advocate Team Direct Number: [phone]  Fax: [phone]

## 2023-11-22 NOTE — Progress Notes (Signed)
 20:35--called to patient's bedside as she was attempting to leave AMA with reports of being verbally aggressive to the nursing staff. Discussed with patient who reported that she was anxious and wanted to get out of here. At time of this discussion, she was alert and oriented to self and place only. She did state it was 2005 and that Tanda Corrente was the president. She was also unable to have a linear conversation or follow her own train of thought. She was calm by the end of the conversation and amenable to staying. No IVC paperwork needed at this time as the patient is of low elopement risk and agreeable to staying overnight.   At this time, the patient was deemed not to have capacity to leave AMA.

## 2023-11-22 NOTE — Hospital Course (Addendum)
 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 At that time TTE was done with LVEF estimated to be 20-25% and severe global hypokinesis. No valvular disease. This LVEF is decreased from the TTE done at [facility] during 02/2023 admission, where the LVED was estimated to be 30-35%. Patient had been nonadherent to medications and continued to use cocaine. Patient became encephalopathic with lactic acidosis. TTE LVEF 20-25%, severe MR and TR. After milrinone , lactic acid down to 1.5 with increase in SvO2 from 26 to 41% suggesting transition towards hemodynamic stability. Not a candidate for advanced therapies, palliative care consulted and family wished to transition to comfort care.     Polysubstance use disorder CIWA with ativan  The patient was counseled on cessation of substances.  She was given thiamine  and folate supplementation and was monitored for refeeding syndrome before transitioning to comfort care.  COPD Current smoker No exacerbations during hospitalization.   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 At her baseline Stable   Hx Hepatitis C, untreated LFTs on admission within normal limits. RUQ US  10/2023 at outside hospital with no focal lesion or mass seen in the liver. Contracted gallbladder. AFP 5.74, normal limits. Patient does not wish for treatment.

## 2023-11-22 NOTE — Progress Notes (Signed)
 Patient wanted to leave AMA, verbally aggressive to staff, pt alert oriented to self and place requesting to talk to her provider. Dr. Mclendon and Dr. Myrna came to talked with the patient and she agreed to stay. HS medication given without difficulty.

## 2023-11-23 LAB — CBC
HCT: 32.4 % — ABNORMAL LOW (ref 36.0–46.0)
Hemoglobin: 10.3 g/dL — ABNORMAL LOW (ref 12.0–15.0)
MCH: 22.2 pg — ABNORMAL LOW (ref 26.0–34.0)
MCHC: 31.8 g/dL (ref 30.0–36.0)
MCV: 69.8 fL — ABNORMAL LOW (ref 80.0–100.0)
Platelets: 196 K/uL (ref 150–400)
RBC: 4.64 MIL/uL (ref 3.87–5.11)
RDW: 20 % — ABNORMAL HIGH (ref 11.5–15.5)
WBC: 9.8 K/uL (ref 4.0–10.5)
nRBC: 0 % (ref 0.0–0.2)

## 2023-11-23 LAB — BASIC METABOLIC PANEL WITH GFR
Anion gap: 12 (ref 5–15)
Anion gap: 18 — ABNORMAL HIGH (ref 5–15)
BUN: 20 mg/dL (ref 8–23)
BUN: 19 mg/dL (ref 8–23)
CO2: 23 mmol/L (ref 22–32)
CO2: 16 mmol/L — ABNORMAL LOW (ref 22–32)
Calcium: 9.2 mg/dL (ref 8.9–10.3)
Calcium: 9.3 mg/dL (ref 8.9–10.3)
Chloride: 104 mmol/L (ref 98–111)
Chloride: 104 mmol/L (ref 98–111)
Creatinine, Ser: 1.24 mg/dL — ABNORMAL HIGH (ref 0.44–1.00)
Creatinine, Ser: 1.14 mg/dL — ABNORMAL HIGH (ref 0.44–1.00)
GFR, Estimated: 46 mL/min — ABNORMAL LOW (ref >60)
GFR, Estimated: 51 mL/min — ABNORMAL LOW (ref >60)
Glucose, Bld: 168 mg/dL — ABNORMAL HIGH (ref 70–99)
Glucose, Bld: 131 mg/dL — ABNORMAL HIGH (ref 70–99)
Potassium: 4.3 mmol/L (ref 3.5–5.1)
Potassium: 4.7 mmol/L (ref 3.5–5.1)
Sodium: 139 mmol/L (ref 135–145)
Sodium: 138 mmol/L (ref 135–145)

## 2023-11-23 LAB — LACTIC ACID, PLASMA
Lactic Acid, Venous: 2.1 mmol/L (ref 0.5–1.9)
Lactic Acid, Venous: 3.7 mmol/L (ref 0.5–1.9)
Lactic Acid, Venous: 2.8 mmol/L (ref 0.5–1.9)

## 2023-11-23 LAB — HEPATIC FUNCTION PANEL
ALT: 19 U/L (ref 0–44)
AST: 35 U/L (ref 15–41)
Albumin: 2.8 g/dL — ABNORMAL LOW (ref 3.5–5.0)
Alkaline Phosphatase: 52 U/L (ref 38–126)
Bilirubin, Direct: 0.9 mg/dL — ABNORMAL HIGH (ref 0.0–0.2)
Indirect Bilirubin: 1.5 mg/dL — ABNORMAL HIGH (ref 0.3–0.9)
Total Bilirubin: 2.4 mg/dL — ABNORMAL HIGH (ref 0.0–1.2)
Total Protein: 6.9 g/dL (ref 6.5–8.1)

## 2023-11-23 LAB — GLUCOSE, CAPILLARY
Glucose-Capillary: 154 mg/dL — ABNORMAL HIGH (ref 70–99)
Glucose-Capillary: 146 mg/dL — ABNORMAL HIGH (ref 70–99)
Glucose-Capillary: 149 mg/dL — ABNORMAL HIGH (ref 70–99)
Glucose-Capillary: 128 mg/dL — ABNORMAL HIGH (ref 70–99)

## 2023-11-23 LAB — MAGNESIUM
Magnesium: 1.8 mg/dL (ref 1.7–2.4)
Magnesium: 2.3 mg/dL (ref 1.7–2.4)

## 2023-11-23 LAB — TROPONIN I (HIGH SENSITIVITY): Troponin I (High Sensitivity): 38 ng/L — ABNORMAL HIGH (ref <18)

## 2023-11-23 MED ORDER — CHLORDIAZEPOXIDE HCL 25 MG PO CAPS: 25.0000 mg | ORAL_CAPSULE | ORAL | Status: DC

## 2023-11-23 MED ORDER — ADULT MULTIVITAMIN W/MINERALS CH
1.0000 | ORAL_TABLET | Freq: Every day | ORAL | Status: DC
  Administered 2023-11-23 – 2023-11-25 (×2): 1 via ORAL
  Filled 2023-11-23 (×3): qty 1

## 2023-11-23 MED ORDER — MAGNESIUM SULFATE 2 GM/50ML IV SOLN
2.0000 g | Freq: Once | INTRAVENOUS | Status: AC
  Administered 2023-11-23: 2 g via INTRAVENOUS
  Filled 2023-11-23: qty 50

## 2023-11-23 MED ORDER — ATORVASTATIN CALCIUM 40 MG PO TABS
40.0000 mg | ORAL_TABLET | Freq: Every day | ORAL | Status: DC
  Administered 2023-11-24: 40 mg via ORAL
  Filled 2023-11-23: qty 1

## 2023-11-23 MED ORDER — THIAMINE MONONITRATE 100 MG PO TABS: 100.0000 mg | ORAL_TABLET | Freq: Every day | ORAL | Status: DC

## 2023-11-23 MED ORDER — CHLORDIAZEPOXIDE HCL 25 MG PO CAPS
25.0000 mg | ORAL_CAPSULE | Freq: Four times a day (QID) | ORAL | Status: AC
  Administered 2023-11-24 (×3): 25 mg via ORAL
  Filled 2023-11-23 (×5): qty 1

## 2023-11-23 MED ORDER — THIAMINE HCL 100 MG/ML IJ SOLN
500.0000 mg | Freq: Three times a day (TID) | INTRAVENOUS | Status: AC
  Administered 2023-11-23 – 2023-11-26 (×9): 500 mg via INTRAVENOUS
  Filled 2023-11-23: qty 500
  Filled 2023-11-23 (×5): qty 5
  Filled 2023-11-23: qty 466.67
  Filled 2023-11-23 (×2): qty 500

## 2023-11-23 MED ORDER — CHLORDIAZEPOXIDE HCL 25 MG PO CAPS
25.0000 mg | ORAL_CAPSULE | Freq: Three times a day (TID) | ORAL | Status: DC
  Administered 2023-11-25 (×2): 25 mg via ORAL
  Filled 2023-11-23 (×3): qty 1

## 2023-11-23 MED ORDER — FOLIC ACID 1 MG PO TABS
1.0000 mg | ORAL_TABLET | Freq: Every day | ORAL | Status: DC
  Administered 2023-11-23 – 2023-11-25 (×3): 1 mg via ORAL
  Filled 2023-11-23 (×3): qty 1

## 2023-11-23 MED ORDER — ALBUTEROL SULFATE (2.5 MG/3ML) 0.083% IN NEBU: 2.5000 mg | INHALATION_SOLUTION | RESPIRATORY_TRACT | Status: DC | PRN

## 2023-11-23 MED ORDER — ASPIRIN 81 MG PO TBEC
81.0000 mg | DELAYED_RELEASE_TABLET | Freq: Every day | ORAL | Status: DC
  Administered 2023-11-23 – 2023-11-25 (×3): 81 mg via ORAL
  Filled 2023-11-23 (×3): qty 1

## 2023-11-23 MED ORDER — HYDROXYZINE HCL 25 MG PO TABS
25.0000 mg | ORAL_TABLET | Freq: Four times a day (QID) | ORAL | Status: DC | PRN
  Administered 2023-11-24: 25 mg via ORAL
  Filled 2023-11-23: qty 1

## 2023-11-23 MED ORDER — CHLORDIAZEPOXIDE HCL 25 MG PO CAPS: 25.0000 mg | ORAL_CAPSULE | Freq: Every day | ORAL | Status: DC

## 2023-11-23 MED ORDER — FUROSEMIDE 10 MG/ML IJ SOLN
40.0000 mg | Freq: Two times a day (BID) | INTRAMUSCULAR | Status: DC
  Administered 2023-11-23: 40 mg via INTRAVENOUS
  Filled 2023-11-23: qty 4

## 2023-11-23 MED ORDER — LOPERAMIDE HCL 2 MG PO CAPS: 2.0000 mg | ORAL_CAPSULE | ORAL | Status: DC | PRN

## 2023-11-23 MED ORDER — CHLORDIAZEPOXIDE HCL 25 MG PO CAPS
25.0000 mg | ORAL_CAPSULE | Freq: Four times a day (QID) | ORAL | Status: DC | PRN
  Administered 2023-11-25: 25 mg via ORAL
  Filled 2023-11-23: qty 1

## 2023-11-23 NOTE — Progress Notes (Signed)
 Evaluated the patient at bedside at the request of our day team due to ongoing encephalopathy.  On our initial evaluation at around 745 she was alert to person and place but still somnolent and frequently falling asleep.  She did follow simple commands and had no specific complaints that she could voice.  On exam she was warm and well-perfused.  Reexamination about an hour later she continued to be somnolent with no change in her mentation but she did have some coolness in her extremities.  Due to the concern for low output heart failure due to her history of HFrEF and rising lactic acid of 3.7 along with continued encephalopathy we got a repeat EKG which did not show any significant change or any signs of ischemia.  Lactic acid repeat showed improvement to 2.8 without any specific intervention.  Further labs currently pending.  Overall encephalopathy is more consistent with benzodiazepine effect rather than decreased perfusion. - Follow-up BMP, magnesium , and troponin - Page for any hypertension, chest pain, shortness of breath, or other change in mentation  Fairy Pool, DO Internal Medicine Resident, PGY-3 Please contact the on call pager at [phone] for any urgent or emergent needs. 10:27 PM 11/23/2023

## 2023-11-23 NOTE — Progress Notes (Addendum)
 Subjective: Emma Hahn is a 73 y.o. female with PMH of polysubtance use disorder, chronic combined systolic and diastolic heart failure, COPD, chronic kidney disease stage 3b, substance use disorder, recurrent hospitalizations, who was recently discharged from [facility] [place]  [facility] for exacerbation of her chronic HF. Found in [place] confused, brought to ED and admitted for acute on chronic HFrEF.   Overnight: Patient tried to leave AMA but did not have capacity at that time.   Today, she is not participating in conversation.   Objective:  Vital signs in last 24 hours: Vitals:   11/23/23 0456 11/23/23 0555 11/23/23 0744 11/23/23 1015  BP: (!) 83/67 109/84 115/73 (!) 133/105  Pulse: (!) 102 93 (!) 102 (!) 107  Resp: 18 19 18 18   Temp: 97.9 F (36.6 C) 97.8 F (36.6 C) (!) 96.9 F (36.1 C) 98.7 F (37.1 C)  TempSrc: Oral Oral Axillary Axillary  SpO2: 98% 95% 96% 97%  Weight:      Height:       Physical Exam: General:NAD, laying in bed, awaken to voice easily but quickly falls back asleep Cardiac:RRR, no murmur appreciated on exam, warm and dry extremities   Pulmonary:normal effort on room air, limited due to patient's effort, no wheezes auscultated  Neuro:awakens to voice, but falls sleep quickly MSK:2+ pitting edema to the knees bilaterally  Skin:warm and dry Psych: unable to assess      Latest Ref Rng & Units 11/23/2023    6:57 AM 11/22/2023    2:42 AM 11/21/2023    8:15 AM  CBC  WBC 4.0 - 10.5 K/uL 9.8  7.6  8.3   Hemoglobin 12.0 - 15.0 g/dL 89.6  9.5  88.8   Hematocrit 36.0 - 46.0 % 32.4  30.4  36.4   Platelets 150 - 400 K/uL 196  195  254         Latest Ref Rng & Units 11/23/2023    6:57 AM 11/22/2023   10:30 PM 11/22/2023    2:42 AM  BMP  Glucose 70 - 99 mg/dL 831  831  828   BUN 8 - 23 mg/dL 20  20  19    Creatinine 0.44 - 1.00 mg/dL 8.75  8.69  8.90   Sodium 135 - 145 mmol/L 139  138  135   Potassium 3.5 - 5.1 mmol/L 4.3  4.6  3.5    Chloride 98 - 111 mmol/L 104  104  104   CO2 22 - 32 mmol/L 23  21  20    Calcium  8.9 - 10.3 mg/dL 9.2  9.0  8.6      Assessment/Plan:  Principal Problem:   Acute congestive heart failure (HCC) Active Problems:   Cocaine use   Cocaine abuse (HCC)   Acute systolic (congestive) heart failure (HCC)  Acute on chronic biventricular sytolic heart failure Nonischemic cardiomyopathy per [facility] in 2021 Severe mitral regurgitation )02/2023 Patient remains hemodynamically stable. She is saturating well on room air with normal effort. She remains volume overloaded on exam; fortunately, she has good urine output recorded as 3.35L yesterday. She does have severe mitral regurgitation on an echocardiogram from 02/2023 and there was discussion about placement of mitral clips in the past with AHF in 2023 when she declined.  Plan: -Will continue with current diuresis of Lasix  IV 40 mg BID, will reassess tomorrow morning prior to re administering lasix  dose  -Will continue to hold BB as patient was NOT compliant with this in the outpatient setting, will  consider starting Coreg on day of discharge due to cocaine use  -Continue entresto  24-26 mg BID -Mag>2, K>4 -Daily standing weights -Strict ins and outs  -Will discuss mitral clips with patient and if she is interested, will consult AHF to assess for candidacy   Encephalopathy Patient presented with encephalopathy that has waxed and waned since arrival. She seems to be more awake and alert in the afternoons. Today, she was quite somnolent this morning and I was unable to assess capacity.  Plan: -Will revaluate her mentation this afternoon -Patient will need evaluated for capacity if she wishes to leave AMA during hospitalization   DM A1c of 7.5 during admission.  Plan: -Continue SSI -Consider Jardiance  prior to discharge   Polysubstance use disorder CIWA with ativan  Cocaine Use  Ciwa was recorded at 4 this morning, unsure of when her last drink of  alcohol was. Plan: -Continue CIWA with ativan  until she has been admitted for 96 hrs   COPD  Low suspicion for exacerbation, normal effort on room air Plan: -Albuterol  nebs q4 prn  -Breo ellipta 1 puff daily   HTN HLD CAD Lipid profile showed an LDL of 77 - Increase atorvastatin  to 80 mg  - Entresto  24-26 mg BID - Resume home ASA 81 mg     Hx Hepatitis C, untreated Hepatitis B non-immune, consider vaccinating.  -pending HCV viral load  Palliative care consulted during last admission in [place] . DNR/ DNI per 11/07/2023 consult done with Palliative care provider Dr. Ivan Zama in [place] . Patient does not want BiPAP or cPAP per that discussion. See in chart. Reiterated this information on admission   Resolved Problems:  __________________________________  Code Status: DNR/DNI, NO BIPAP or CPAP VTE Prophylaxis:Xarelto  Diet:HH IVF:N/A Barriers to Discharge:Treatment of HFrEF, SNF placement  Dispo: Anticipated discharge in approximately 2 day(s).   Kandis Perkins, DO 11/23/2023, 10:53 AM Please contact the on call pager at: [phone]

## 2023-11-23 NOTE — Plan of Care (Signed)
  Problem: Education: Goal: Ability to describe self-care measures that may prevent or decrease complications (Diabetes Survival Skills Education) will improve Outcome: Not Progressing

## 2023-11-23 NOTE — Progress Notes (Addendum)
 Evaluated patient to assess mentation this afternoon. Per RN, she had received a total of 4mg  of ativan  since we evaluated her this morning for elevated CIWA scores.   Upon entering the room, patient is laying undressed on the bed mumbling. She is not able to participate in conversation with us .   On exam, her extremities remain warm with good perfusion. Her legs are still edematous. Last elevated CIWA score was 11 at 11 am followed by a score of 4 at 1pm. Last ativan  dose was 2mg  at 12:22pm.      11/23/2023    1:29 PM 11/23/2023    1:22 PM 11/23/2023   11:57 AM  Vitals with BMI  Systolic  131 123  Diastolic  81 98  Pulse 101 101 115     I suspect her mentation at this moment to be a combination of ativan  and alcohol withdrawal as patient has been admitted for 2 days now with the last drink date as unknown.   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  She is laying in bed and oriented to name.  She is able to follow commands and move all 4 extremities bilaterally.  When I asked patient what is wrong, she continues to say nothing.  On exam, her bilateral upper and lower extremities remain warm and she has preserved motor strength of all 4 extremities.  Although her lactic acid came back slightly elevated at 2.1, she was well perfused on exam.  I will repeat her lactic acid level.  Night team will evaluate patient and follow-up on lactic acid level.  At this time, she does not have capacity.

## 2023-11-24 ENCOUNTER — Other Ambulatory Visit: Payer: Self-pay

## 2023-11-24 ENCOUNTER — Inpatient Hospital Stay (HOSPITAL_COMMUNITY)

## 2023-11-24 DIAGNOSIS — R7989 Other specified abnormal findings of blood chemistry: Secondary | ICD-10-CM

## 2023-11-24 DIAGNOSIS — I2510 Atherosclerotic heart disease of native coronary artery without angina pectoris: Secondary | ICD-10-CM

## 2023-11-24 DIAGNOSIS — I5023 Acute on chronic systolic (congestive) heart failure: Secondary | ICD-10-CM | POA: Diagnosis not present

## 2023-11-24 DIAGNOSIS — I340 Nonrheumatic mitral (valve) insufficiency: Secondary | ICD-10-CM

## 2023-11-24 DIAGNOSIS — I5021 Acute systolic (congestive) heart failure: Secondary | ICD-10-CM | POA: Diagnosis not present

## 2023-11-24 DIAGNOSIS — Z7951 Long term (current) use of inhaled steroids: Secondary | ICD-10-CM

## 2023-11-24 DIAGNOSIS — R0609 Other forms of dyspnea: Secondary | ICD-10-CM

## 2023-11-24 DIAGNOSIS — I110 Hypertensive heart disease with heart failure: Secondary | ICD-10-CM | POA: Diagnosis not present

## 2023-11-24 DIAGNOSIS — E119 Type 2 diabetes mellitus without complications: Secondary | ICD-10-CM

## 2023-11-24 DIAGNOSIS — Z79899 Other long term (current) drug therapy: Secondary | ICD-10-CM

## 2023-11-24 DIAGNOSIS — I10 Essential (primary) hypertension: Secondary | ICD-10-CM | POA: Diagnosis not present

## 2023-11-24 DIAGNOSIS — G9340 Encephalopathy, unspecified: Secondary | ICD-10-CM | POA: Diagnosis not present

## 2023-11-24 LAB — ECHOCARDIOGRAM COMPLETE
AR max vel: 1.71 cm2
AV Area VTI: 1.63 cm2
AV Area mean vel: 1.91 cm2
AV Mean grad: 3.7 mmHg
AV Peak grad: 10.8 mmHg
Ao pk vel: 1.64 m/s
Area-P 1/2: 5.88 cm2
Height: 64 in
S' Lateral: 4.6 cm
Weight: 2197.55 [oz_av]

## 2023-11-24 LAB — COOXEMETRY PANEL
Carboxyhemoglobin: 1.9 % — ABNORMAL HIGH (ref 0.5–1.5)
Methemoglobin: 0.8 % (ref 0.0–1.5)
O2 Saturation: 26.2 %
Total hemoglobin: 10.5 g/dL — ABNORMAL LOW (ref 12.0–16.0)

## 2023-11-24 LAB — BASIC METABOLIC PANEL WITH GFR
Anion gap: 15 (ref 5–15)
BUN: 19 mg/dL (ref 8–23)
CO2: 19 mmol/L — ABNORMAL LOW (ref 22–32)
Calcium: 9 mg/dL (ref 8.9–10.3)
Chloride: 102 mmol/L (ref 98–111)
Creatinine, Ser: 1.25 mg/dL — ABNORMAL HIGH (ref 0.44–1.00)
GFR, Estimated: 45 mL/min — ABNORMAL LOW (ref >60)
Glucose, Bld: 136 mg/dL — ABNORMAL HIGH (ref 70–99)
Potassium: 4.4 mmol/L (ref 3.5–5.1)
Sodium: 136 mmol/L (ref 135–145)

## 2023-11-24 LAB — MAGNESIUM: Magnesium: 2 mg/dL (ref 1.7–2.4)

## 2023-11-24 LAB — GLUCOSE, CAPILLARY
Glucose-Capillary: 146 mg/dL — ABNORMAL HIGH (ref 70–99)
Glucose-Capillary: 178 mg/dL — ABNORMAL HIGH (ref 70–99)
Glucose-Capillary: 189 mg/dL — ABNORMAL HIGH (ref 70–99)
Glucose-Capillary: 175 mg/dL — ABNORMAL HIGH (ref 70–99)

## 2023-11-24 LAB — LACTIC ACID, PLASMA
Lactic Acid, Venous: 2.2 mmol/L (ref 0.5–1.9)
Lactic Acid, Venous: 3.2 mmol/L (ref 0.5–1.9)

## 2023-11-24 LAB — PROCALCITONIN: Procalcitonin: <0.1 ng/mL

## 2023-11-24 MED ORDER — FUROSEMIDE 10 MG/ML IJ SOLN
60.0000 mg | Freq: Two times a day (BID) | INTRAMUSCULAR | Status: DC
  Administered 2023-11-24 – 2023-11-25 (×2): 60 mg via INTRAVENOUS
  Filled 2023-11-24 (×2): qty 6

## 2023-11-24 MED ORDER — SODIUM CHLORIDE 0.9% FLUSH
10.0000 mL | Freq: Two times a day (BID) | INTRAVENOUS | Status: DC
  Administered 2023-11-24: 20 mL
  Administered 2023-11-24 – 2023-11-30 (×10): 10 mL
  Administered 2023-11-30: 20 mL
  Administered 2023-12-01 – 2023-12-09 (×17): 10 mL

## 2023-11-24 MED ORDER — FUROSEMIDE 10 MG/ML IJ SOLN: 40.0000 mg | Freq: Once | INTRAMUSCULAR | Status: DC

## 2023-11-24 MED ORDER — MILRINONE LACTATE IN DEXTROSE 20-5 MG/100ML-% IV SOLN
0.2500 ug/kg/min | INTRAVENOUS | Status: DC
  Administered 2023-11-24 – 2023-11-25 (×2): 0.25 ug/kg/min via INTRAVENOUS
  Administered 2023-11-25: 0.375 ug/kg/min via INTRAVENOUS
  Administered 2023-11-26: 0.25 ug/kg/min via INTRAVENOUS
  Filled 2023-11-24 (×4): qty 100

## 2023-11-24 MED ORDER — CHLORHEXIDINE GLUCONATE CLOTH 2 % EX PADS
6.0000 | MEDICATED_PAD | Freq: Every day | CUTANEOUS | Status: DC
  Administered 2023-11-24 – 2023-11-26 (×3): 6 via TOPICAL

## 2023-11-24 MED ORDER — ATORVASTATIN CALCIUM 80 MG PO TABS
80.0000 mg | ORAL_TABLET | Freq: Every day | ORAL | Status: DC
  Administered 2023-11-25: 80 mg via ORAL
  Filled 2023-11-24: qty 1

## 2023-11-24 MED ORDER — SODIUM CHLORIDE 0.9% FLUSH
10.0000 mL | INTRAVENOUS | Status: DC | PRN
  Administered 2023-12-04: 10 mL

## 2023-11-24 MED ORDER — FUROSEMIDE 10 MG/ML IJ SOLN
40.0000 mg | Freq: Once | INTRAMUSCULAR | Status: AC
  Administered 2023-11-24: 40 mg via INTRAVENOUS
  Filled 2023-11-24: qty 4

## 2023-11-24 NOTE — Plan of Care (Signed)
°  Problem: Clinical Measurements: °Goal: Will remain free from infection °Outcome: Progressing °Goal: Respiratory complications will improve °Outcome: Progressing °  °Problem: Elimination: °Goal: Will not experience complications related to urinary retention °Outcome: Progressing °  °Problem: Safety: °Goal: Ability to remain free from injury will improve °Outcome: Progressing °  °

## 2023-11-24 NOTE — Progress Notes (Addendum)
 Subjective: Emma Hahn is a 73 y.o. female with PMH of polysubtance use disorder, chronic combined systolic and diastolic heart failure, COPD, chronic kidney disease stage 3b, substance use disorder, recurrent hospitalizations, who was recently discharged from [facility] [place]  [facility] for exacerbation of her chronic HF. Found in [place] confused, brought to ED and admitted for acute on chronic HFrEF.   Overnight: Patient's lactic acid level trended: 2.1-->3.7-->2.8. Night team evaluated her and she was still encephalopathic which was attributed to benzodiazepine effects.   Today, she is not participating in conversation. She will say fine but that is all.   Objective:  Vital signs in last 24 hours: Vitals:   11/24/23 0400 11/24/23 0630 11/24/23 0730 11/24/23 1124  BP: 134/80 (!) 128/92 125/69 123/64  Pulse:  (!) 109 (!) 103 (!) 103  Resp: 19 20 17 18   Temp: 97.7 F (36.5 C)  98 F (36.7 C) 98.2 F (36.8 C)  TempSrc: Oral  Axillary Axillary  SpO2: 95%  92% 99%  Weight: 62.3 kg     Height:       Physical Exam: General:Somnolent, laying in bed  Cardiac:tachycardic on exam, no murmur appreciated, cool right hand (left hand is between her legs), cool feet and distal legs Pulmonary:normal effort on room air  Neuro:difficult to arouse, opens eyes to physical stimulus, will not participate in orientation questions MSK:1+ pitting edema of BL LE to the midshins  Psych: unable to assess      Latest Ref Rng & Units 11/23/2023    6:57 AM 11/22/2023    2:42 AM 11/21/2023    8:15 AM  CBC  WBC 4.0 - 10.5 K/uL 9.8  7.6  8.3   Hemoglobin 12.0 - 15.0 g/dL 89.6  9.5  88.8   Hematocrit 36.0 - 46.0 % 32.4  30.4  36.4   Platelets 150 - 400 K/uL 196  195  254         Latest Ref Rng & Units 11/24/2023    7:50 AM 11/23/2023    9:29 PM 11/23/2023    6:57 AM  BMP  Glucose 70 - 99 mg/dL 863  868  831   BUN 8 - 23 mg/dL 19  19  20    Creatinine 0.44 - 1.00 mg/dL 8.74  8.85  8.75    Sodium 135 - 145 mmol/L 136  138  139   Potassium 3.5 - 5.1 mmol/L 4.4  4.7  4.3   Chloride 98 - 111 mmol/L 102  104  104   CO2 22 - 32 mmol/L 19  16  23    Calcium  8.9 - 10.3 mg/dL 9.0  9.3  9.2      Assessment/Plan:  Principal Problem:   Acute congestive heart failure (HCC) Active Problems:   Cocaine use   Cocaine abuse (HCC)   Acute systolic (congestive) heart failure (HCC)   Encephalopathy  Acute on chronic biventricular sytolic heart failure Nonischemic cardiomyopathy per [facility] in 2021 Severe mitral regurgitation )02/2023 Lactic Acidosis  Patient's mentation has progressively worsened overnight. With her severe HFrEF and lactic acidosis, concern for development of cardiogenic shock. Extremities are cool on exam which is new since yesterday. She has remained tachycardic; however, her pulse pressure is NOT narrow. Cardiology has been consulted.  Plan: -Follow cardiology's plan and recommendations in regards to diuresis  -Order for PICC line for Co. ox monitoring placed -Will continue to hold BB as patient was NOT compliant with this in the outpatient setting, will consider starting  Coreg on day of discharge due to cocaine use  -Continue entresto  24-26 mg BID -Mag>2, K>4 -Daily standing weights -Strict ins and outs  -F/u repeat lactic acid  -Echocardiogram ordered   Encephalopathy Suspect that patient's worsening encephalopathy is due to cardiogenic shock with the lactic acidosis and cool extremities on exam today.  Lower suspicion for benzodiazepine effect given the 24 hours since her last Ativan  dose. Plan: -Evaluating her for cardiogenic shock with PICC line for Coox monitoring -At this time, patient does not have capacity. -Patient will need evaluated for capacity if she wishes to leave AMA during hospitalization   DM A1c of 7.5 during admission.  Plan: -Continue SSI -Consider Jardiance  prior to discharge   Polysubstance use disorder CIWA with ativan  Cocaine Use   Ciwa was recorded at 4 this morning, unsure of when her last drink of alcohol was. Plan: -Continue CIWA until she has been admitted for 96 hrs  -Librium taper, day 2/4, with prn librium 25 mg q6: patient has not received any prn librium   COPD  Low suspicion for exacerbation, normal effort on room air Plan: -Albuterol  nebs q4 prn  -Breo ellipta 1 puff daily   HTN HLD CAD Lipid profile showed an LDL of 77 - Increased atorvastatin  to 80 mg  - Entresto  24-26 mg BID - Resume home ASA 81 mg    Hx Hepatitis C, untreated Hepatitis B non-immune, consider vaccinating.  -pending HCV viral load  Palliative care consulted during last admission in [place] . DNR/ DNI per 11/07/2023 consult done with Palliative care provider Dr. Ivan Zama in [place] . Patient does not want BiPAP or cPAP per that discussion. See in chart. Reiterated this information on admission   Resolved Problems:  __________________________________  Code Status: DNR/DNI, NO BIPAP or CPAP VTE Prophylaxis:Xarelto  Diet:HH IVF:N/A Barriers to Discharge:Treatment of HFrEF, SNF placement  Dispo: Anticipated discharge in approximately 2 day(s).   Kandis Perkins, DO 11/24/2023, 12:59 PM Please contact the on call pager at: [phone]

## 2023-11-24 NOTE — Progress Notes (Signed)
 Echocardiogram 2D Echocardiogram has been performed.  Emma Hahn 11/24/2023, 3:20 PM

## 2023-11-24 NOTE — Plan of Care (Signed)
  Problem: Coping: Goal: Ability to adjust to condition or change in health will improve Outcome: Not Progressing

## 2023-11-24 NOTE — Progress Notes (Addendum)
 PICC line placed, coox came back at 26%, CVP 21. She is volume overloaded with cool extremities, elevated lactic acid. MAPs have been normal. Overall consistent with low output heart failure, high SVR maintaining her MAPs. Will start milrinone at 0.25, dose IV lasix  60mg  bid. F/u coox tomorrow. Likely touch base with HF team tomorrow for further assistance with management. Inotropes likely just palliative, poor canididate for long term advanced therapies given polysubstance abuse and poor compliance.    Dorn Ross MD

## 2023-11-24 NOTE — Consult Note (Addendum)
 Cardiology Consultation   Patient ID: Emma Hahn MRN: [id number]; DOB: [birth year]  Admit date: 11/21/2023 Date of Consult: 11/24/2023  PCP:  Rosalea Rosina SAILOR, PA   [facility] Providers Cardiologist:  Stanly DELENA Leavens, MD    Patient Profile: Emma Hahn is a 73 y.o. female with a hx of polysubstance use disorder (crack, cocaine), nonischemic cardiomyopathy (cath 2021: normal coronary arteries), chronic combined systolic and diastolic heart failure (EF 20-25% in 10/2023 in [place] ), severe MR, HTN, HLD, DM, COPD, CKD stage 3b, recurrent hospitalizations, hepatitis C who is being seen 11/24/2023 for the evaluation of acute HFrEF at the request of Dr. Karna.  History of Present Illness: Emma Hahn was history of multiple admission for CHF. Last seen by heart care 02/2023 for CHF consult.  Echo showed LVEF of 30 to 35% with global hypokinesis, severe MR and small pericardial effusion.  He was diuresed with IV Lasix  and switch to p.o. Lasix .  Negative net 1.7 mL this admission.  Weight 128 pounds (down from 139 pounds).  Discharged on p.o. Lasix  40 mg twice daily, Entresto  24/26 twice daily, Toprol -XL 25 mg daily, spironolactone  12.5 mg daily.  Recently discharged 10/2023 from [facility] [place]  [facility] for acute hypoxic respiratory failure likely secondary to HFrEF exacerbation and COPD exacerbation likely due to CAP. BNP 12,986. Tn 22<25. ECHO showed EF 20-25%, severe global hypokinesis, mild to moderately dilated LV, mildly dilated RV, mildly dilated LA, moderate to severely dilated RA, moderate MV regurgitation, moderate to severe TV regurgitation, dilated IVC, trivial pericardial effusion.  Noted medication noncompliance.  Discharged on Lipitor 40 mg, Jardiance  10 mg daily, Lasix  40 mg daily, Toprol -XL 25 mg daily, Entresto  24/26 twice daily.  Discontinue spironolactone  25 mg for unknown reason. Palliative care was consulted during this admission with  patient DNR/DNI since 11/07/2023.   She was found in [place], confused, knocking on strangers' doors. She was brought in to [facility] for further evaluation. Presented to ED with SOB and edema.  K 4.3 > 4.4, Mg 1.6 > 2, Cr 1.16 >1.25, BNP 2858, TN 37 > 38, WBC WNL, Lactic acid 2.1 >3.7 > 2.8, chronic microcytic anemia with Hgb 11.1 >10.3, Hep A reactive, UDS + for cocaine  EKG: sinus tachycardia HR 100's with nonspecific T wave changes with PVC's and  no acute ischemic changes  CXR: stable cardiomegaly, mild pulmonary edema, small pleural effusions  CTA head: no acute finding Treated with IV Lasix  40 mg X5 then discontinued. I/O  -2252, wt 142 >137 lbs.  Also treated with Librium with suspected withdrawal from either ETOH and/or cocaine use. Also treated with Thiamine  and folate for toxic encephalopathy.  On Xarelto for VTE prophylaxis.   On interview, patient was a poor historian due to A/O x1 and not answering all questions.  Patient admits to SOB and LE edema for unknown time. She is not able to compare to recent [place]  admission or further characterize. She is not able to comment on any changes since being in hospital. Denies any chest pain, palpitation or syncope. Admits to medication noncompliance. Admits to tobacco, etoh and drug use but not able to provide any additional information. Per HPI review, admits to 1+ pack per day of tobacco, 1-2 small liquor bottles every other day or so, but denies IV drug use. Per nurse, patient mental states has improved since yesterday, however patient is still very confused.   Past Medical History:  Diagnosis Date   Borderline diabetes    COPD (chronic  obstructive pulmonary disease) (HCC)    Habitual alcohol use    Tobacco abuse     Past Surgical History:  Procedure Laterality Date   RIGHT/LEFT HEART CATH AND CORONARY ANGIOGRAPHY N/A 12/08/2019   Procedure: RIGHT/LEFT HEART CATH AND CORONARY ANGIOGRAPHY;  Surgeon: Burnard Debby LABOR, MD;  Location: [facility]  INVASIVE CV LAB;  Service: Cardiovascular;  Laterality: N/A;   TEE WITHOUT CARDIOVERSION N/A 12/09/2019   Procedure: TRANSESOPHAGEAL ECHOCARDIOGRAM (TEE);  Surgeon: Mona Vinie BROCKS, MD;  Location: [facility] ENDOSCOPY;  Service: Cardiovascular;  Laterality: N/A;   TOTAL ABDOMINAL HYSTERECTOMY       Home Medications:  Prior to Admission medications   Medication Sig Start Date End Date Taking? Authorizing Provider  albuterol  (VENTOLIN  HFA) 108 (90 Base) MCG/ACT inhaler Inhale 2 puffs into the lungs every 6 (six) hours as needed for wheezing or shortness of breath.    [provider]  aspirin  EC 81 MG tablet Take 1 tablet (81 mg total) by mouth daily. Swallow whole. 08/15/21   Milford, Harlene HERO, FNP  atorvastatin  (LIPITOR) 40 MG tablet Take 1 tablet (40 mg total) by mouth daily. NEEDS FOLLOW UP APPOINTMENT FOR MORE REFILLS Patient taking differently: Take 40 mg by mouth daily. 08/22/22   Bensimhon, Toribio SAUNDERS, MD  furosemide  (LASIX ) 40 MG tablet Take 1 tablet (40 mg total) by mouth 2 (two) times daily. 03/08/23   Arrien, Mauricio Daniel, MD  losartan  (COZAAR ) 25 MG tablet Take 0.5 tablets (12.5 mg total) by mouth daily. 03/08/23 04/07/23  Arrien, Mauricio Daniel, MD  metoprolol  succinate (TOPROL -XL) 25 MG 24 hr tablet Take 1 tablet (25 mg total) by mouth daily. 03/09/23 04/08/23  Arrien, Elidia Toribio, MD  nitroGLYCERIN  (NITROSTAT ) 0.4 MG SL tablet Place 1 tablet (0.4 mg total) under the tongue every 5 (five) minutes as needed for chest pain. 08/15/21   Milford, Harlene HERO, FNP  spironolactone  (ALDACTONE ) 25 MG tablet Take 0.5 tablets (12.5 mg total) by mouth daily. 03/09/23 05/07/23  Arrien, Mauricio Daniel, MD    Scheduled Meds:  aspirin  EC  81 mg Oral Daily   atorvastatin   40 mg Oral Daily   chlordiazePOXIDE  25 mg Oral QID   Followed by   NOREEN ON 11/25/2023] chlordiazePOXIDE  25 mg Oral TID   Followed by   NOREEN ON 11/26/2023] chlordiazePOXIDE  25 mg Oral BH-qamhs   Followed by   NOREEN ON  11/27/2023] chlordiazePOXIDE  25 mg Oral Daily   fluticasone  furoate-vilanterol  1 puff Inhalation Daily   folic acid   1 mg Oral Daily   insulin aspart  0-5 Units Subcutaneous QHS   insulin aspart  0-9 Units Subcutaneous TID WC   multivitamin with minerals  1 tablet Oral Daily   nicotine  7 mg Transdermal Daily   rivaroxaban  10 mg Oral Daily   sacubitril -valsartan   1 tablet Oral BID   Continuous Infusions:  thiamine  (VITAMIN B1) injection 500 mg (11/23/23 2155)   PRN Meds: acetaminophen  **OR** acetaminophen , albuterol , chlordiazePOXIDE, hydrOXYzine , loperamide, senna-docusate  Allergies:   No Known Allergies  Social History:   Social History   Socioeconomic History   Marital status: Single    Spouse name: Not on file   Number of children: Not on file   Years of education: Not on file   Highest education level: Not on file  Occupational History   Not on file  Tobacco Use   Smoking status: Every Day    Types: Cigarettes   Smokeless tobacco: Never  Substance and Sexual  Activity   Alcohol use: Yes    Comment: Usually drinks vodka or gin or beer, 3 days out of the week   Drug use: Never   Sexual activity: Not on file  Other Topics Concern   Not on file  Social History Narrative   Not on file   Family History:   Family History  Problem Relation Age of Onset   Stroke Maternal Grandmother        ? Possible heart disease too - patient unsure     ROS:  Please see the history of present illness.  All other ROS reviewed and negative.     Physical Exam/Data: Vitals:   11/23/23 2345 11/24/23 0400 11/24/23 0630 11/24/23 0730  BP: (!) 141/74 134/80 (!) 128/92 125/69  Pulse: (!) 112  (!) 109 (!) 103  Resp: 20 19 20 17   Temp: 97.6 F (36.4 C) 97.7 F (36.5 C)  98 F (36.7 C)  TempSrc: Axillary Oral  Axillary  SpO2: 100% 95%  92%  Weight:  62.3 kg    Height:        Intake/Output Summary (Last 24 hours) at 11/24/2023 0909 Last data filed at 11/23/2023 2230 Gross per  24 hour  Intake 220.26 ml  Output 500 ml  Net -279.74 ml      11/24/2023    4:00 AM 11/23/2023    1:37 AM 11/22/2023    4:04 AM  Last 3 Weights  Weight (lbs) 137 lb 5.6 oz 141 lb 15.6 oz 142 lb 13.7 oz  Weight (kg) 62.3 kg 64.4 kg 64.8 kg     Body mass index is 23.58 kg/m.  General:  confused; A/O x1 HEENT: normal Neck: no JVD Vascular: No carotid bruits; Distal pulses 2+ bilaterally Cardiac:  normal S1, S2; RRR; no murmur  Lungs:  diminished lung bases bilaterally Abd: soft, nontender, no hepatomegaly  Ext: 1+ pitting edema Musculoskeletal:  No deformities, BUE and BLE strength normal and equal Skin: warm and dry  Neuro:  CNs 2-12 intact, no focal abnormalities noted Psych:  Normal affect   EKG:  The EKG was personally reviewed and demonstrates: sinus tachycardia HR 100's with nonspecific T wave changes with PVC's and  no acute ischemic changes  Telemetry:  Telemetry was personally reviewed and demonstrates:  ST, HR 100's with PVC;s  Relevant CV Studies: Cath 2021 Mild - moderate right heart pressure elevation with mild pulmonary hypertension (mean PA 32 mmHg). Normal coronary arteries. Echo documentation of EF at 25 to 30%. 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  Smoking cessation.  Cardiac MRI IMPRESSION: 06/2021 1.  Mild LV dilatation with severe systolic dysfunction (EF 22%)   2.  Normal RV size with moderate systolic dysfunction (EF 35%)   3. Basal septal midwall late gadolinium enhancement, which is a scar pattern seen in nonischemic cardiomyopathies and associated with a worse prognosis   4. RV insertion site LGE, which is a nonspecific scar pattern often seen in setting of elevated pulmonary pressures   5. Moderate pericardial effusion measuring 14mm adjacent to LV lateral wall   6.  Moderate mitral regurgitation (regurgitant  fraction 33%)  ECHO IMPRESSIONS 02/2023  1. Left ventricular ejection fraction, by estimation, is 30 to 35%. The  left ventricle has moderately decreased function. The left ventricle  demonstrates global hypokinesis. Left ventricular diastolic function could  not be evaluated.   2. Right ventricular systolic function  is normal. The right ventricular  size is normal.   3. Patent left atrial appendage seen on two chamber view. Left atrial  size was severely dilated.   4. A small pericardial effusion is present.   5. The mitral valve is abnormal. Severe mitral valve regurgitation.   6. The aortic valve is tricuspid. Aortic valve regurgitation is not  visualized. No aortic stenosis is present.   Comparison(s): Prior images reviewed side by side. Mitral valve  regurgitation is once again severe.    ECHO 09/2023 in [place]  Procedure  A two-dimensional transthoracic echocardiogram with color flow Doppler was performed.  ([id number], [id number]). A complete two-dimensional transthoracic echocardiogram was performed (2D,  M-mode, spectral and color flow Doppler). ([id number]). Study quality is good.  Indications  Echo was performed for suspected Hypertension.  Left Ventricle  There is normal left ventricular wall thickness. The left ventricle is mild to moderate dilated.  No evidence of thrombus seen on this study. Ejection Fraction = 20-25%. LV diastolic filling  appears normal. There is severe global hypokinesis of the left ventricle.  Right Ventricle  The right ventricle is mildly dilated. The right ventricular systolic function is normal. The  right ventricular wall motion is normal.  Atria  The left atrium is mildly dilated. The right atrium is moderate to severely dilated.  Mitral Valve  Mitral valve structure is normal. There is no evidence of mitral valve prolapse. There is moderate  mitral regurgitation. The regurgitant jet is posteriorly directed. The severity of mitral   regurgitation may be UNDERestimated due to the eccentric nature of the regurgitant jet. No mitral  valve stenosis.  Tricuspid Valve  Anatomically normal tricuspid valve. There is no tricuspid valve prolapse. There is moderate to  severe tricuspid regurgitation. Estimated RVSP is mildly elevated at 36 mmHg. No evidence of  tricuspid stenosis.  Aortic Valve  The aortic valve appears structurally normal. The aortic valve is trileaflet. No aortic  regurgitation. There is no aortic stenosis.  Pulmonic Valve  No evidence of stenosis. The pulmonic valve is normal. There is no vegetation seen on the pulmonic  valve. There is no pulmonic valve regurgitation.  Great Vessels  The ascending aorta is normal in size. The aortic root is normal in size.  IVC  Dilated IVC consistent with elevated RA pressure.  Pericardium  Trivial pericardial effusion, not hemodynamically significant. There is no pleural effusion noted  on this exam.   Laboratory Data: High Sensitivity Troponin:   Recent Labs  Lab 11/21/23 0815 11/21/23 1001 11/23/23 2129  TROPONINIHS 37* 34* 38*     Chemistry Recent Labs  Lab 11/23/23 0657 11/23/23 2129 11/24/23 0750  NA 139 138 136  K 4.3 4.7 4.4  CL 104 104 102  CO2 23 16* 19*  GLUCOSE 168* 131* 136*  BUN 20 19 19   CREATININE 1.24* 1.14* 1.25*  CALCIUM  9.2 9.3 9.0  MG 1.8 2.3 2.0  GFRNONAA 46* 51* 45*  ANIONGAP 12 18* 15    Recent Labs  Lab 11/21/23 0815 11/22/23 0242 11/23/23 2129  PROT 6.9 6.1* 6.9  ALBUMIN 2.8* 2.6* 2.8*  AST 38 30 35  ALT 22 19 19   ALKPHOS 48 48 52  BILITOT 1.7* 1.6* 2.4*   Lipids  Recent Labs  Lab 11/22/23 2230  CHOL 117  TRIG 45  HDL 31*  LDLCALC 77  CHOLHDL 3.8    Hematology Recent Labs  Lab 11/21/23 0815 11/22/23 0242 11/23/23 0657  WBC 8.3 7.6 9.8  RBC 5.03 4.29  4.64  HGB 11.1* 9.5* 10.3*  HCT 36.4 30.4* 32.4*  MCV 72.4* 70.9* 69.8*  MCH 22.1* 22.1* 22.2*  MCHC 30.5 31.3 31.8  RDW 20.1* 19.5* 20.0*  PLT  254 195 196   Thyroid No results for input(s): TSH, FREET4 in the last 168 hours.  BNP Recent Labs  Lab 11/21/23 0815  BNP 2,858.8*    DDimer No results for input(s): DDIMER in the last 168 hours.  Radiology/Studies:  US  EKG SITE RITE Result Date: 11/24/2023 If Site Rite image not attached, placement could not be confirmed due to current cardiac rhythm.  CT Head Wo Contrast Result Date: 11/21/2023 EXAM: CT HEAD WITHOUT CONTRAST 11/21/2023 08:45:41 AM TECHNIQUE: CT of the head was performed without the administration of intravenous contrast. Automated exposure control, iterative reconstruction, and/or weight based adjustment of the mA/kV was utilized to reduce the radiation dose to as low as reasonably achievable. COMPARISON: None available. CLINICAL HISTORY: 73 year old female. Mental status change, unknown cause. FINDINGS: BRAIN AND VENTRICLES: Brain volume within normal limits for age. Mild for age patchy white matter hypodensity, mostly in the deep white matter capsules. 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 No suspicious intracranial vascular hyperdensity. No acute hemorrhage. No evidence of acute infarct. No hydrocephalus. No extra-axial collection. No mass effect or midline shift. ORBITS: No acute abnormality. SINUSES: Paranasal sinuses, middle ears and mastoids well aerated. SOFT TISSUES AND SKULL: Calcified atherosclerosis at the skull base. No acute soft tissue abnormality. No skull fracture. IMPRESSION: 1. No acute intracranial abnormality. 2. Mild for age chronic small vessel disease. Electronically signed by: Helayne Hurst MD 11/21/2023 08:57 AM EST RP Workstation: [id number]   DG Chest 2 View Result Date: 11/21/2023 EXAM: 2 VIEW(S) XRAY OF THE CHEST 11/21/2023 08:29:00 AM COMPARISON: 03/04/2023 CLINICAL HISTORY: CHF FINDINGS: LUNGS AND PLEURA: Probable mild bilateral pulmonary edema is noted. Small pleural effusions are present.  No focal pulmonary opacity. No pneumothorax. HEART AND MEDIASTINUM: Stable cardiomegaly. BONES AND SOFT TISSUES: No acute osseous abnormality. IMPRESSION: 1. Stable cardiomegaly. 2. Findings compatible with mild pulmonary edema. 3. Small pleural effusions. Electronically signed by: Lynwood Seip MD 11/21/2023 08:34 AM EST RP Workstation: [id number]     Assessment and Plan: Acute HFrEF Nonischemic cardiomyopathy EF has been around 25 to 30% since 2021 R/L heart cath from 12/08/19 showed normal coronary arteries, mild-moderate right heart pressure elevation with mild pulmonary HTN. Findings overall compatible with nonischemic cardiomyopathy. TEE on 12/09/19 showed severe MR.  Cardiac MRI in 06/2021 showed basal septal midwall LGE, a scar pattern in nonischemic cardiomyopathies  ECHO 02/2023: LVEF of 30-35% with global hypokinesis, normal RV function, severe MR, and small pericardial effusion.  ECHO 10/2023 in [place]  showed EF 20-25%, severe global hypokinesis, mild to moderately dilated LV, mildly dilated RV, mildly dilated LA, moderate to severely dilated RA, dilated IVC, trivial pericardial effusion Presented with SOB and LE edema.  BNP 2858 (12,986 in 10/2023 in [place] ).  CXR consistent with CHF. Treated with IV Lasix  40 mg X5 then discontinued. I/O  -2252, wt 142 >137 lbs (02/2023 admission 139 >128 lbs),  Cr 1.16 >1.25 Still remains volume overloaded with 1+ pitting edema. Recommend continuing IV lasix  for pm dose then can consider switch to PO tomorrow.  Continue on Entresto  24/26 mg BID Would plan to add back BB and Spiro as tolerated  Previously noted, avoided SGLT2i due to etoh abuse and concern for volume depletion.  Suspect secondary to drug use and medication noncompliance.  No need to repeat ECHO at this time.  Previously noted not a candidate for ICD with ongoing EtOH use and noncompliance.   Elevated troponin Normal coronaries via cath in 2021 and nonischemic cardiomyopathy via  cardiac MRI in 2023. TN 37-38. Likely 2/2 to demand ischemia.  EKG: sinus tachycardia HR 100's with nonspecific T wave changes with PVC's and  no acute ischemic changes  Denies any chest pain, however would reassess once patient mental status at baseline. Less concern for ACS at this time based on flat troponin, EKG w/o ischemic changes and no chest pain. No plan for ischemic evaluation at this time.   Hypomagnesemia  K 4.3 > 4.4, Mg 1.6 > 2 Treated with supplement  Continue to monitor  HLD  LDL 77 in 11/2023 Continue Lipitor 40 mg   HTN  BP well controlled at 125/69 Continue Entresto  as above.   MR Patient has a known history of mitral regurgitation. Noted to be severe in 2021 but then read mild on Echo in 11/2021. Echo 02/2023 again show severe MR.  ECHO 10/2023: moderate MV regurgitation, moderate to severe TV regurgitation Patient was previously referred to Dr. Wonda for consideration of MV repair.  Not a good candidate at that time due to poor follow-up with cardiology, questionable medication compliance, and alcohol abuse.  Discussed if compliance is demonstrated then could consider repeat evaluation as outpatient. Continue to monitor in outpatient  Polysubstance use  Per HPI review, admits to 1+ pack per day of tobacco, 1-2 small liquor bottles every other day or so, but denies IV drug use. UDS + for cocaine  Managed by primary team  VTE prophylaxis  Started Xarelto this admission   Managed by primary team: Encephalopathy  COPD Alcohol Abuse  Risk Assessment/Risk Scores:  [place]  Heart Association (NYHA) Functional Class NYHA Class III       For questions or updates, please contact  HeartCare Please consult [email or web address] for contact info under      Signed, Lorette CINDERELLA Kapur, PA-C  11/24/2023 9:09 AM    Attending Note   73 yo female history of chronic HFrEF LVEF 20-25% by 09/2023 echo in [place]  (in care everywhere), NICM,  EtoH/cocaine  abuse, COPD, por medical compliance, admitted with SOB. Patient is altered at time of my evaluation, not able to obtain history.     Admit labs  WBC 8.3 Hgb 11.1 Plt 254 K 4.3 BUN 23 Cr 1.16 BNP 2858 Mg 1.6 HgbA1c 7.5 Procalc neg Lactic acid 2.1-->3.7-->2.8-->2.2 Trop 37-->34 EKG SR with baseline artifact. UDS + cocaine CXR mild pulm edema, small pleural effusions CT head: no acute process  02/2023 echo: LVEF 30-35%, indet diastolic, normal RV, severe MR 09/2023 echo [place] : LVEF 20-25%, normal RV function, mod MR though eccentric possibly underestimated, mod to severe TR    1.Acute on chronic HFrEF - 06/2021 cMRI: LVEF 22%, mod RV dysfunction, NICM LGE like pattern - 02/2023 echo: LVEF 30-35%, indet diastolic, normal RV, severe MR - 09/2023 echo [place] : LVEF 20-25%, normal RV function, mod MR though eccentric possibly underestimated, mod to severe TR - R/L heart cath from 12/08/19 showed normal coronary arteries   CXR mild pulm edema, small pleural effusions. BNP 2858 - received IV lasix  40mg  x 1 yesterday. I/Os are incomplete. Bed weights 142-->137 lbs, she has AMS unable to stand for weights. Stable renal function - normal MAPs but elevated lactic acid, possibly due to low CO with high SVR. AMS could be output related though also with  substance abuse, getting intermittent ativan  and librium - awaiting PICC line to check coox and CVP. Pending results may warrant inotrope, would be temporary use only as not a candidate for long term advanced therapies given poor compliance and ongoing substance abuse. - repeat echo   -management has been affected by ongoing substance abuse EtOH/cocaine, cocaine+ this admission. Poor medication compliance and compliance with f/u.  - had been seen in HF clinic, last 11/2021 - not a candidate for ICD due to substance abuse per notes.  - no beta blocker due to possible low output. Can continue entresto . Possibly add MRA, SGLT2i pending hemodynamics with  ongoing diuresis. From HF clinic note had avoid SGLT2i due to dizziness, concerns for volume depletion with ongoign EtoH use  - volume overloaded, sluggish diuresis, cool extremities, elevated lactic acid. I suspect low output heart failure. PICC line ordered. Patient actually without IV access currently, lost peripheral. Also altered, agitated pulling off monitor. - once PICC placed obtain coox and CVP. If not able to get PICC would replace peripheral and emperically start milrinone for suspected low output HF. Use would be only temporary to help with diuresis and symptoms.     2.Mitral regurgitation - from notes patient turned down evaluation for mitra clip    3. DNR/DNI - per H&P established DNR/DNI during recent admission in [place]  - pending clinical course may very well need palliative consultation to address goals of care. Severe HF with noncompliance and ongoing substance abuse.   Dorn Ross MD

## 2023-11-24 NOTE — Progress Notes (Signed)
 Peripherally Inserted Central Catheter Placement  The IV Nurse has discussed with the patient and/or persons authorized to consent for the patient, the purpose of this procedure and the potential benefits and risks involved with this procedure.  The benefits include less needle sticks, lab draws from the catheter, and the patient may be discharged home with the catheter. Risks include, but not limited to, infection, bleeding, blood clot (thrombus formation), and puncture of an artery; nerve damage and irregular heartbeat and possibility to perform a PICC exchange if needed/ordered by physician.  Alternatives to this procedure were also discussed.  Bard Power PICC patient education guide, fact sheet on infection prevention and patient information card has been provided to patient /or left at bedside. Obtained the telephone consent from Daughter Darice.   PICC Placement Documentation  PICC Double Lumen 11/24/23 Right Brachial 36 cm 0 cm (Active)  Indication for Insertion or Continuance of Line Chronic illness with exacerbations (CF, Sickle Cell, etc.) 11/24/23 1350  Exposed Catheter (cm) 0 cm 11/24/23 1350  Site Assessment Clean, Dry, Intact 11/24/23 1350  Lumen #1 Status Flushed;Saline locked;Blood return noted 11/24/23 1350  Lumen #2 Status Flushed;Saline locked;Blood return noted 11/24/23 1350  Dressing Type Transparent;Securing device 11/24/23 1350  Dressing Status Antimicrobial disc/dressing in place;Clean, Dry, Intact 11/24/23 1350  Line Care Connections checked and tightened 11/24/23 1350  Line Adjustment (NICU/IV Team Only) No 11/24/23 1350  Dressing Intervention New dressing 11/24/23 1350  Dressing Change Due 12/01/23 11/24/23 1350       Ricahrd Schwager Sheral Ruth 11/24/2023, 1:52 PM

## 2023-11-25 ENCOUNTER — Other Ambulatory Visit (HOSPITAL_COMMUNITY): Payer: Self-pay

## 2023-11-25 DIAGNOSIS — I361 Nonrheumatic tricuspid (valve) insufficiency: Secondary | ICD-10-CM

## 2023-11-25 DIAGNOSIS — F1090 Alcohol use, unspecified, uncomplicated: Secondary | ICD-10-CM | POA: Diagnosis not present

## 2023-11-25 DIAGNOSIS — I509 Heart failure, unspecified: Secondary | ICD-10-CM

## 2023-11-25 DIAGNOSIS — R570 Cardiogenic shock: Secondary | ICD-10-CM

## 2023-11-25 DIAGNOSIS — I5043 Acute on chronic combined systolic (congestive) and diastolic (congestive) heart failure: Secondary | ICD-10-CM | POA: Diagnosis not present

## 2023-11-25 DIAGNOSIS — I5021 Acute systolic (congestive) heart failure: Secondary | ICD-10-CM | POA: Diagnosis not present

## 2023-11-25 DIAGNOSIS — I340 Nonrheumatic mitral (valve) insufficiency: Secondary | ICD-10-CM | POA: Diagnosis not present

## 2023-11-25 LAB — BASIC METABOLIC PANEL WITH GFR
Anion gap: 10 (ref 5–15)
BUN: 24 mg/dL — ABNORMAL HIGH (ref 8–23)
CO2: 24 mmol/L (ref 22–32)
Calcium: 8.8 mg/dL — ABNORMAL LOW (ref 8.9–10.3)
Chloride: 102 mmol/L (ref 98–111)
Creatinine, Ser: 1.34 mg/dL — ABNORMAL HIGH (ref 0.44–1.00)
GFR, Estimated: 42 mL/min — ABNORMAL LOW (ref >60)
Glucose, Bld: 167 mg/dL — ABNORMAL HIGH (ref 70–99)
Potassium: 3.8 mmol/L (ref 3.5–5.1)
Sodium: 136 mmol/L (ref 135–145)

## 2023-11-25 LAB — HCV RNA QUANT RFLX ULTRA OR GENOTYP
HCV RNA Qnt(log copy/mL): 6.53 {Log_IU}/mL
HepC Qn: 3390000 [IU]/mL

## 2023-11-25 LAB — MAGNESIUM: Magnesium: 1.7 mg/dL (ref 1.7–2.4)

## 2023-11-25 LAB — COOXEMETRY PANEL
Carboxyhemoglobin: 1.9 % — ABNORMAL HIGH (ref 0.5–1.5)
Carboxyhemoglobin: 1.6 % — ABNORMAL HIGH (ref 0.5–1.5)
Methemoglobin: 1.2 % (ref 0.0–1.5)
Methemoglobin: <0.7 % (ref 0.0–1.5)
O2 Saturation: 41.4 %
O2 Saturation: 56 %
Total hemoglobin: 9.5 g/dL — ABNORMAL LOW (ref 12.0–16.0)
Total hemoglobin: 9.9 g/dL — ABNORMAL LOW (ref 12.0–16.0)

## 2023-11-25 LAB — GC/CHLAMYDIA PROBE AMP (~~LOC~~) NOT AT ARMC
Chlamydia: NEGATIVE
Comment: NEGATIVE
Comment: NORMAL
Neisseria Gonorrhea: NEGATIVE

## 2023-11-25 LAB — GLUCOSE, CAPILLARY
Glucose-Capillary: 147 mg/dL — ABNORMAL HIGH (ref 70–99)
Glucose-Capillary: 138 mg/dL — ABNORMAL HIGH (ref 70–99)
Glucose-Capillary: 163 mg/dL — ABNORMAL HIGH (ref 70–99)
Glucose-Capillary: 84 mg/dL (ref 70–99)

## 2023-11-25 LAB — HEPATITIS C GENOTYPE

## 2023-11-25 LAB — LACTIC ACID, PLASMA: Lactic Acid, Venous: 1.5 mmol/L (ref 0.5–1.9)

## 2023-11-25 MED ORDER — SPIRONOLACTONE 25 MG PO TABS
25.0000 mg | ORAL_TABLET | Freq: Every day | ORAL | Status: DC
  Administered 2023-11-25: 25 mg via ORAL
  Filled 2023-11-25: qty 1

## 2023-11-25 MED ORDER — SACUBITRIL-VALSARTAN 49-51 MG PO TABS
1.0000 | ORAL_TABLET | Freq: Two times a day (BID) | ORAL | Status: DC
  Administered 2023-11-25: 1 via ORAL
  Filled 2023-11-25: qty 1

## 2023-11-25 MED ORDER — FUROSEMIDE 10 MG/ML IJ SOLN
80.0000 mg | Freq: Two times a day (BID) | INTRAMUSCULAR | Status: DC
  Administered 2023-11-25 – 2023-11-26 (×2): 80 mg via INTRAVENOUS
  Filled 2023-11-25 (×2): qty 8

## 2023-11-25 MED ORDER — MAGNESIUM SULFATE 2 GM/50ML IV SOLN
2.0000 g | Freq: Once | INTRAVENOUS | Status: AC
  Administered 2023-11-25: 2 g via INTRAVENOUS
  Filled 2023-11-25: qty 50

## 2023-11-25 MED ORDER — POTASSIUM CHLORIDE CRYS ER 20 MEQ PO TBCR
40.0000 meq | EXTENDED_RELEASE_TABLET | Freq: Once | ORAL | Status: AC
  Administered 2023-11-25: 40 meq via ORAL
  Filled 2023-11-25: qty 2

## 2023-11-25 NOTE — Plan of Care (Signed)
  Problem: Clinical Measurements: Goal: Will remain free from infection Outcome: Progressing   Problem: Safety: Goal: Ability to remain free from injury will improve Outcome: Progressing   

## 2023-11-25 NOTE — Consult Note (Addendum)
 Palliative Medicine Inpatient Consult Note  Consulting Provider:   Kandis Perkins, DO   Reason for consult:   Palliative Care Consult Services Palliative Medicine Consult  Reason for Consult? Low output HF   On milrinone with an LVEF of 20-25%, has severe MR and TR. Is already DNR/DNI. Likely poor prognosis overall. Encephalopathic. Please assist with hospice discussion vs comfort care.  Per chart review ED nurse communicated with  daughter in [place] , who stated she will not come to support mother. Patient is not to return to her home in [place]  unless no cocaine use. Please assist with contacting family.    11/25/2023  HPI:  Per intake H&P --> Emma Hahn is a 73 y.o. female with PMH of polysubtance use disorder, chronic combined systolic and diastolic heart failure, COPD, chronic kidney disease stage 3b, substance use disorder, recurrent hospitalizations. Palliative care has been requested to support goals of care conversations.   Clinical Assessment/Goals of Care:  *Please note that this is a verbal dictation therefore any spelling or grammatical errors are due to the Dragon Medical One system interpretation.  I have reviewed medical records including EPIC notes, labs and imaging, received report from bedside RN, assessed the patient who is lying in bed speaking indiscernible words. She is generally frail appearing.    I called and spoke with patients daughter, Emma Hahn though she was at work during my call and kept saying to treat her momma. I shared my concerns of continuing treatment and the possibility despite present measures of poor outcomes. She shares that she is not just going to let her momma die. We reviewed that this is not the intent of my calling but rather to educate and provide necessary information for family to proceed with these difficult decisions. I shared the importance of a family meeting which she did seem to be okay with. She had to hang up due to  being at work.   I then Emma Hahn's other daughter, Emma Hahn  to further discuss diagnosis prognosis, GOC, EOL wishes, disposition and options.   I introduced Palliative Medicine as specialized medical care for people living with serious illness. It focuses on providing relief from the symptoms and stress of a serious illness. The goal is to improve quality of life for both the patient and the family.  Medical History Review and Understanding:  A review of Emma Hahn's past medical history significant for polysubstance abuse, combined systolic and diastolic heart failure, stage IIIb chronic kidney disease, COPD, cocaine and alcohol abuse, hepatitis C, hypertension, hyperlipidemia and diabetes mellitus was completed.  Social History:  Emma Hahn is originally from [place].  She lived in [name], [name]  from 2009-2017.  She has been in [place]  on and off since 2017.  Emma Hahn is divorced.  Emma Hahn has 3 children, 5 grandchildren, and 82 great-grandchildren. 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  Functional and Nutritional State:  Prior to hospitalization [name] was functional able to perform B ADLs and IADLs.  She had been living with her daughter, Emma Hahn.   Emma Hahn has always struggled with eating as her addiction tends to overcome any need for formal nourishment.  Advance Directives:  A detailed discussion was had today regarding advanced directives.  [name] has no established advance directives.  In the state of [place]  healthcare decisions are deferred to her next of kin which in her instance are her 3 children, Emma Hahn Emma Hahn, and Emma Hahn  Code Status:  Concepts specific to code status, artifical feeding and hydration, continued IV  antibiotics and rehospitalization was had.  The difference between a aggressive medical intervention path  and a palliative comfort care path for this patient at this time was had.   [name] is an established DNAR/DNI code status.    Discussion:  Patients daughter, Emma Hahn and I discussed her poor overall health. Emma Hahn shares that she was living with her for a while then went to live with Emma Hahn. About a week ago she and Emma Hahn got into a disagreement resulting in the patients packing up and leaving the home. She apparently had gone to a friends home and shelter(s). The patients family was not sure how she had gotten to [place] other than coming for something drug related. Emma Hahn has struggled with additional throughout the majority of her live.  I explained the Emma Hahn the concerns of the medical teams in the setting of Emma Hahn's biventricular heart failure, the progressive, incurable nature of the disease, the level of supportive medications which are now being administered with little improvement. We reviewed that it is important as [name] is unable right now to speak for herself to determine what it is believed that she would want for herself. Her daughter shares that she has wanted to die for a long time hence why she neglected in her compliance to prior recommendations for medical management. She does not believe that Emma Hahn would want to be hospitalized, poked, and prodded at. She shares that her brother, Emma Hahn feels the same way.   We reviewed the importance of meeting together as [name] has multiple chronic diseases that have been worsening and this trend will continue. We reviewed the importance of making decisions within Lelania's best interested.  Emma Hahn plans to come down to [facility] tomorrow from [place] . She is agreeable to meeting at 1:30PM and is going to try to have her brother and sister on Speakerphones. She does understand the importance of care decisions moving forward.   Discussed the importance of continued conversation with family and their  medical providers regarding overall plan of care and treatment options, ensuring decisions are within the context of the patients values and GOCs.  Decision  Maker: Emma Hahn (Daughter): [phone] Emma Hahn (Daughter): [phone] (Mobile)   SUMMARY OF RECOMMENDATIONS   DNAR/DNI  For now allowing time for outcomes --> Informed family we are pursuing short term measures and long term outcomes despite these measures will be poor  Explained to patients sister, Emma Hahn concerns related to the severity of patients heart failure in addition to her other comorbid conditions  Patients daughter plans to come from [place]  tomorrow at 1:30PM to discuss goals of care further --> have emphasized the importance of all three children being present (on speaker-phone if not in person); There are no advanced directives or HCPOA documents all three children need to weigh in on path moving forward  Ongoing PMT support  Code Status/Advance Care Planning: DNAR/DNI  Palliative Prophylaxis:  Aspiration, Bowel Regimen, Delirium Protocol, Frequent Pain Assessment, Oral Care, Palliative Wound Care, and Turn Reposition  Additional Recommendations (Limitations, Scope, Preferences): Continue current care  Psycho-social/Spiritual:  Desire for further Chaplaincy support: Yes Additional Recommendations: Education on chronic disease burden   Prognosis: Limited overall.  Discharge Planning: Discharge to be determined.   Vitals:   11/25/23 0830 11/25/23 1130  BP:  (!) 143/67  Pulse: (!) 102 (!) 107  Resp: 18 (!) 21  Temp:  (!) 97.5 F (36.4 C)  SpO2:  94%    Intake/Output Summary (Last 24 hours) at 11/25/2023 1324 Last data filed  at 11/25/2023 1223 Gross per 24 hour  Intake 652.35 ml  Output 450 ml  Net 202.35 ml   Last Weight  Most recent update: 11/25/2023  4:12 AM    Weight  61.2 kg (134 lb 14.7 oz)            LABS: CBC:    Component Value Date/Time   WBC 9.8 11/23/2023 0657   HGB 10.3 (L) 11/23/2023 0657   HCT 32.4 (L) 11/23/2023 0657   PLT 196 11/23/2023 0657   MCV 69.8 (L) 11/23/2023 0657   NEUTROABS 5.6 11/21/2023 0815    LYMPHSABS 1.6 11/21/2023 0815   MONOABS 0.9 11/21/2023 0815   EOSABS 0.2 11/21/2023 0815   BASOSABS 0.0 11/21/2023 0815   Comprehensive Metabolic Panel:    Component Value Date/Time   NA 136 11/25/2023 0336   NA 138 02/19/2020 1049   K 3.8 11/25/2023 0336   CL 102 11/25/2023 0336   CO2 24 11/25/2023 0336   BUN 24 (H) 11/25/2023 0336   BUN 14 02/19/2020 1049   CREATININE 1.34 (H) 11/25/2023 0336   GLUCOSE 167 (H) 11/25/2023 0336   CALCIUM  8.8 (L) 11/25/2023 0336   AST 35 11/23/2023 2129   ALT 19 11/23/2023 2129   ALKPHOS 52 11/23/2023 2129   BILITOT 2.4 (H) 11/23/2023 2129   PROT 6.9 11/23/2023 2129   ALBUMIN 2.8 (L) 11/23/2023 2129   Gen:  Elderly AA F chronically ill appearing HEENT: Dry mucous membranes CV: irregular rate and rhythm  PULM: On RA, breathing is even and nonlabored ABD: soft/nontender  EXT: No edema  Neuro: Somnolent, nonsensicle  PPS:  40%   This conversation/these recommendations were discussed with patient primary care team, Dr. Eben ______________________________________________________ Rosaline Becton [facility] Palliative Medicine Team Team Cell Phone: [phone] Please utilize secure chat with additional questions, if there is no response within 30 minutes please call the above phone number  Total Time: 75 Billing based on MDM: High  Palliative Medicine Team providers are available by phone from 7am to 7pm daily and can be reached through the team cell phone.  Should this patient require assistance outside of these hours, please call the patient's attending physician.

## 2023-11-25 NOTE — Progress Notes (Signed)
 HD#3 SUBJECTIVE:  Patient Summary: Emma Hahn is a 73 y.o. female with PMH of polysubtance use disorder, chronic combined systolic and diastolic heart failure, COPD, chronic kidney disease stage 3b, substance use disorder, recurrent hospitalizations, who was recently discharged from [facility] [place]  [facility] for exacerbation of her chronic HF. Found in [place] confused, brought to ED and admitted for acute on chronic HFrEF.    Overnight Events: CIWA 12 at 0200, given 25 mg Librium  Interim History: Not fully participating in conversation, is able to tell me her birthday correctly, not place or situation. Worried about finding her coat. Not able to gain valuable insight about her symptoms.   OBJECTIVE:  Vital Signs: Vitals:   11/25/23 0204 11/25/23 0405 11/25/23 0411 11/25/23 0620  BP: (!) 145/70 127/74  (!) 151/74  Pulse: 87 87  (!) 109  Resp:  20    Temp:  (!) 97.5 F (36.4 C)    TempSrc:  Axillary    SpO2:  93%    Weight:   61.2 kg   Height:       Supplemental O2: Room Air SpO2: 93 %  Filed Weights   11/23/23 0137 11/24/23 0400 11/25/23 0411  Weight: 64.4 kg 62.3 kg 61.2 kg     Intake/Output Summary (Last 24 hours) at 11/25/2023 0650 Last data filed at 11/24/2023 2322 Gross per 24 hour  Intake 740.49 ml  Output 450 ml  Net 290.49 ml   Net IO Since Admission: -2,202.25 mL [11/25/23 0650]  Physical Exam: Physical Exam Vitals reviewed.  Constitutional:      Appearance: She is ill-appearing. She is not diaphoretic.     Comments: Able to arouse Not alert Food hanging out of her mouth  HENT:     Head: Normocephalic and atraumatic.     Nose: Nose normal.     Mouth/Throat:     Pharynx: Oropharyngeal exudate present.  Eyes:     Comments: Eyes remained closed  Cardiovascular:     Rate and Rhythm: Regular rhythm. Tachycardia present.  Pulmonary:     Effort: Pulmonary effort is normal.  Abdominal:     General: Bowel sounds are normal.      Palpations: Abdomen is soft.     Tenderness: There is no abdominal tenderness.  Musculoskeletal:        General: No tenderness.     Right lower leg: Edema present.     Left lower leg: Edema present.     Comments: Edema improving  BL LE and UE warm   Skin:    General: Skin is warm.     Comments: BL LE woody induration  Neurological:     Mental Status: She is disoriented.     Comments: +BL grip strength Oriented to self, not oriented to place or situation     Patient Lines/Drains/Airways Status     Active Line/Drains/Airways     Name Placement date Placement time Site Days   PICC Double Lumen 11/24/23 Right Brachial 36 cm 0 cm 11/24/23  1335  -- 1   External Urinary Catheter 11/22/23  0838  --  3            Pertinent labs and imaging:  Mg 1.7 Lactic 3.2>1.5 Coox O2 26>41 today    Latest Ref Rng & Units 11/23/2023    6:57 AM 11/22/2023    2:42 AM 11/21/2023    8:15 AM  CBC  WBC 4.0 - 10.5 K/uL 9.8  7.6  8.3  Hemoglobin 12.0 - 15.0 g/dL 89.6  9.5  88.8   Hematocrit 36.0 - 46.0 % 32.4  30.4  36.4   Platelets 150 - 400 K/uL 196  195  254        Latest Ref Rng & Units 11/25/2023    3:36 AM 11/24/2023    7:50 AM 11/23/2023    9:29 PM  CMP  Glucose 70 - 99 mg/dL 832  863  868   BUN 8 - 23 mg/dL 24  19  19    Creatinine 0.44 - 1.00 mg/dL 8.65  8.74  8.85   Sodium 135 - 145 mmol/L 136  136  138   Potassium 3.5 - 5.1 mmol/L 3.8  4.4  4.7   Chloride 98 - 111 mmol/L 102  102  104   CO2 22 - 32 mmol/L 24  19  16    Calcium  8.9 - 10.3 mg/dL 8.8  9.0  9.3   Total Protein 6.5 - 8.1 g/dL   6.9   Total Bilirubin 0.0 - 1.2 mg/dL   2.4   Alkaline Phos 38 - 126 U/L   52   AST 15 - 41 U/L   35   ALT 0 - 44 U/L   19     ECHOCARDIOGRAM COMPLETE Result Date: 11/24/2023    IMPRESSIONS  1. Left ventricular ejection fraction, by estimation, is 20 to 25%. The left ventricle has severely decreased function. The left ventricle demonstrates global hypokinesis. Left ventricular diastolic  parameters are indeterminate. There is the interventricular septum is flattened in diastole ('D' shaped left ventricle), consistent with right ventricular volume overload.  2. Right ventricular systolic function is mildly reduced. The right ventricular size is normal. There is moderately elevated pulmonary artery systolic pressure.PASP likely underestimated in setting of severe tricuspid regurgitation.  3. Left atrial size was severely dilated.  4. Right atrial size was moderately dilated.  5. Eccentric posterior mitral regurgitation, eccentricity affects evaluation. Appears to be severe MR. . The mitral valve is abnormal. Severe mitral valve regurgitation. No evidence of mitral stenosis.  6. Systolic hepatic flow reversal consistent with severe tricuspid regurgitation. . The tricuspid valve is abnormal. Tricuspid valve regurgitation is severe.  7. The aortic valve has an indeterminant number of cusps. Aortic valve regurgitation is not visualized. No aortic stenosis is present.  8. The inferior vena cava is dilated in size with <50% respiratory variability, suggesting right atrial pressure of 15 mmHg.  US  EKG SITE RITE Result Date: 11/24/2023 If Site Rite image not attached, placement could not be confirmed due to current cardiac rhythm.   ASSESSMENT/PLAN:  Assessment: Principal Problem:   Acute congestive heart failure (HCC) Active Problems:   Cocaine use   Cocaine abuse (HCC)   Acute systolic (congestive) heart failure (HCC)   Encephalopathy   Plan: Acute on chronic biventricular sytolic heart failure Nonischemic cardiomyopathy per [facility] in 2021 Severe mitral regurgitation 11/2023 Lactic Acidosis, resolved  Mg 1.7 and 2g IV ordered today. TTE LVEF 20-25%, severe MR and TR. After milrinone, lactic acid down to 1.5 with increase in SvO2 from 26 to 41% suggesting transition towards hemodynamic stability. Increasing milrinone to 0.375 and continue Lasix  for one more day. Start spironolactone . Not a  candidate for advanced therapies, palliative care consulted. Spoke with daughter about severity of illness, lack of options and decision to involve palliative care. Daughter understands and wants to continue treatment. I will continue to provide updates as her mothers condition evolves.  -Follow cardiology's plan and recommendations -  IV milrinone 0.375 11/10 and IV Lasix  60 mg BID  -Follow Co-ox -start spiro 25 mg daily  -entresto  24-26 mg BID -Mag>2, K>4 -Daily standing weights -Strict ins and outs    Encephalopathy Sitting up in bed and it appears she was eating her breakfast. She was ruminating on finding her coat. Poor perfusion and sedatives are the likely etiology.  -PICC line for Coox monitoring -At this time, patient does not have capacity to leave AMA   DM A1c of 7.5 during admission. FBG this AM 147.  -Continue SSI -Consider Jardiance  prior to discharge    Polysubstance use disorder CIWA with ativan  Cocaine Use  Prn 25 mg last night for CIWA of 12. Will stop Librium taper tomorrow and reassess cognition Plan: -Continue CIWA until she has been admitted for 96 hrs  -Librium taper 25 mg QID, day 3/4, with prn librium 25 mg q6   COPD  Low suspicion for exacerbation, normal effort on room air Plan: -Albuterol  nebs q4 prn  -Breo ellipta 1 puff daily    HTN HLD CAD Lipid profile showed an LDL of 77 - atorvastatin  to 80 mg  - Entresto  24-26 mg BID - ASA 81 mg    Hx Hepatitis C, untreated Hepatitis B non-immune, consider vaccinating.  -pending HCV viral load   Palliative care consulted during last admission in [place] . DNR/ DNI per 11/07/2023 consult done with Palliative care provider Dr. Ivan Zama in [place] . Patient does not want BiPAP or cPAP per that discussion. See in chart. Reiterated this information on admission     Best Practice: Diet: Cardiac diet IVF: Fluids: none, Rate: None VTE: rivaroxaban (XARELTO) tablet 10 mg Start: 11/21/23 2200 Place TED hose  Start: 11/21/23 1642 SCDs Start: 11/21/23 1258 Code: DNR/DNI  Disposition planning: Therapy Recs: None, DME: none Family Contact: Daughter, called and notified. DISPO: Anticipated discharge TBD to Home pending medical mgmt and clinical improvement.  Signature:  Viktoria Charmayne [facility]  6:50 AM, 11/25/2023  On Call pager [phone]

## 2023-11-25 NOTE — Progress Notes (Signed)
 Seen on evening rounds. She's admitted for severe acute on chronic systolic heart failure. On milrinone. Her speech is incoherent at present. Her HR is ~110, systolic BP ~114, she has strong right radial pulse and warm feet. Other objective measure of perfusion like mixed venous O2 are improving but despite this encephalopathy remains. Continue close clinical monitoring, prognosis seems poor.  Ozell Kung MD 11/25/2023, 9:07 PM

## 2023-11-25 NOTE — Consult Note (Addendum)
 Advanced Heart Failure Team Consult Note   Primary Physician: Rosalea Rosina SAILOR, PA Cardiologist:  Stanly DELENA Leavens, MD  Reason for Consultation: Acute on chronic HFrEF  HPI:    Emma Hahn is seen today for evaluation of acute on chronic HFrEF at the request of Dr. Leavens with [facility].  73 y.o. female with a history of ETOH abuse, tobacco abuse, crack/cocaine abuse, MR has previously refused mitral valve interventions, hep C (declines treatment) COPD, noncompliance, NICM, and HFrEF.    She developed dyspnea and chest pressure in 5/21, echo performed and recommended outpatient cardiology evaluation, but she declined.  She had worsening SOB and chest pressure, prompting hospitalization in 11/21. Found to have severe LV dysfunction with LVEF 25 to 30%, severe mitral and tricuspid regurgitation. [facility] 12/08/19 showed no obstructive CAD, mild pulmonary hypertension (mean PA pressure of 32 mmHg), mean PCWP 27 mmHg, with a v-wave of 33. Fick was low 2.5/1.5 GDMT imited by low BP.   TEE 12/09/19 EF 25-30% predominantly central MR with mild posterior component due to tethering of posterior MV leaflet.   Admitted 6/23 with a/c CHF after stopping all meds for past 6 months. Echo showed EF 45-50%, severe MR, and LVH.   She was last seen in HF clinic in 11/23 and has since been lost to follow-up.  She was admitted in 02/25 with acute on chronic CHF. Had been noncompliant with medications. Echo w/ LVEF 30-35%, RV okay, severe MR.   Admitted to [facility]  in 9/25 and with acute hypoxic respiratory failure 2/2 acute on chronic HFrEF and possible COPD exacerbation. Echo w/ LVEF 20-25%, RV okay, at least moderate MR. She was then readmitted with the same issue in 10/25. She was not taking HF medications regularly. Still abusing ETOH.   She was discharged from [facility]  10/25 then presented to [facility] ED 11/06 with altered mental status. Had been found  knocking on doors looking for a place to stay. Drove down to [place] from [place] after she was kicked out of her house. Found to have a lactic acidosis (LA up to 3.7), UDS + for cocaine. She was admitted for encephalopathy, acute on chronic HFrEF/cardiogenic shock and concern for withdrawals (Placed on CIWA). Cardiology was consulted. Initial CO-OX was markedly reduced at 26% and CVP 21. She was started on 0.25 milrinone and IV lasix  60 BID.   CO-OX 41% this am on 0.25 milrinone, milrinone increased to 0.375 mcg/kg/min. Lactic acid has cleared. Charted UOP is marginal, doubt Is/Os accurate as weight is trending down. Advanced Heart Failure asked to assist with management.  Home Medications Prior to Admission medications   Medication Sig Start Date End Date Taking? Authorizing Provider  albuterol  (VENTOLIN  HFA) 108 (90 Base) MCG/ACT inhaler Inhale 2 puffs into the lungs every 6 (six) hours as needed for wheezing or shortness of breath.    [provider]  aspirin  EC 81 MG tablet Take 1 tablet (81 mg total) by mouth daily. Swallow whole. 08/15/21   Milford, Harlene HERO, FNP  atorvastatin  (LIPITOR) 40 MG tablet Take 1 tablet (40 mg total) by mouth daily. NEEDS FOLLOW UP APPOINTMENT FOR MORE REFILLS Patient taking differently: Take 40 mg by mouth daily. 08/22/22   Bensimhon, Toribio SAUNDERS, MD  furosemide  (LASIX ) 40 MG tablet Take 1 tablet (40 mg total) by mouth 2 (two) times daily. 03/08/23   Arrien, Mauricio Daniel, MD  losartan  (COZAAR ) 25 MG tablet Take 0.5 tablets (12.5 mg total) by mouth  daily. 03/08/23 04/07/23  Arrien, Mauricio Daniel, MD  metoprolol  succinate (TOPROL -XL) 25 MG 24 hr tablet Take 1 tablet (25 mg total) by mouth daily. 03/09/23 04/08/23  Arrien, Mauricio Daniel, MD  nitroGLYCERIN  (NITROSTAT ) 0.4 MG SL tablet Place 1 tablet (0.4 mg total) under the tongue every 5 (five) minutes as needed for chest pain. 08/15/21   Milford, Harlene HERO, FNP  oxyCODONE (OXY IR/ROXICODONE) 5 MG immediate release tablet Take  5 mg by mouth every 4 (four) hours as needed for moderate pain (pain score 4-6) ((or if patient requests it for severe pain) for up to 7 days.). 11/07/23   [provider]  predniSONE  (DELTASONE ) 20 MG tablet Take 20 mg by mouth 2 (two) times daily. 11/09/23   [provider]  spironolactone  (ALDACTONE ) 25 MG tablet Take 0.5 tablets (12.5 mg total) by mouth daily. 03/09/23 05/07/23  Arrien, Elidia Sieving, MD    Past Medical History: Past Medical History:  Diagnosis Date   Borderline diabetes    COPD (chronic obstructive pulmonary disease) (HCC)    Habitual alcohol use    Tobacco abuse     Past Surgical History: Past Surgical History:  Procedure Laterality Date   RIGHT/LEFT HEART CATH AND CORONARY ANGIOGRAPHY N/A 12/08/2019   Procedure: RIGHT/LEFT HEART CATH AND CORONARY ANGIOGRAPHY;  Surgeon: Burnard Debby LABOR, MD;  Location: [facility] INVASIVE CV LAB;  Service: Cardiovascular;  Laterality: N/A;   TEE WITHOUT CARDIOVERSION N/A 12/09/2019   Procedure: TRANSESOPHAGEAL ECHOCARDIOGRAM (TEE);  Surgeon: Mona Vinie BROCKS, MD;  Location: [facility] ENDOSCOPY;  Service: Cardiovascular;  Laterality: N/A;   TOTAL ABDOMINAL HYSTERECTOMY      Family History: Family History  Problem Relation Age of Onset   Stroke Maternal Grandmother        ? Possible heart disease too - patient unsure    Social History: Social History   Socioeconomic History   Marital status: Single    Spouse name: Not on file   Number of children: Not on file   Years of education: Not on file   Highest education level: Not on file  Occupational History   Not on file  Tobacco Use   Smoking status: Every Day    Types: Cigarettes   Smokeless tobacco: Never  Substance and Sexual Activity   Alcohol use: Yes    Comment: Usually drinks vodka or gin or beer, 3 days out of the week   Drug use: Never   Sexual activity: Not on file  Other Topics Concern   Not on file  Social History Narrative   Not on file   Social  Drivers of Health   Financial Resource Strain: Medium Risk (07/11/2021)   Overall Financial Resource Strain (CARDIA)    Difficulty of Paying Living Expenses: Somewhat hard  Food Insecurity: No Food Insecurity (11/21/2023)   Hunger Vital Sign    Worried About Running Out of Food in the Last Year: Never true    Ran Out of Food in the Last Year: Never true  Transportation Needs: No Transportation Needs (11/21/2023)   PRAPARE - Administrator, Civil Service (Medical): No    Lack of Transportation (Non-Medical): No  Physical Activity: Not on file  Stress: Not on file  Social Connections: Unknown (11/21/2023)   Social Connection and Isolation Panel    Frequency of Communication with Friends and Family: More than three times a week    Frequency of Social Gatherings with Friends and Family: Not on file    Attends Religious  Services: Not on file    Active Member of Clubs or Organizations: Not on file    Attends Club or Organization Meetings: Not on file    Marital Status: Not on file    Allergies:  No Known Allergies  Objective:    Vital Signs:   Temp:  [97.5 F (36.4 C)-98.7 F (37.1 C)] 97.7 F (36.5 C) (11/10 0620) Pulse Rate:  [87-109] 102 (11/10 0830) Resp:  [18-20] 18 (11/10 0830) BP: (117-151)/(64-95) 151/74 (11/10 0620) SpO2:  [93 %-100 %] 93 % (11/10 0620) Weight:  [61.2 kg] 61.2 kg (11/10 0411) Last BM Date : 11/21/23  Weight change: Filed Weights   11/23/23 0137 11/24/23 0400 11/25/23 0411  Weight: 64.4 kg 62.3 kg 61.2 kg    Intake/Output:   Intake/Output Summary (Last 24 hours) at 11/25/2023 1117 Last data filed at 11/24/2023 2322 Gross per 24 hour  Intake 500.49 ml  Output 450 ml  Net 50.49 ml      Physical Exam    General:  Chronically ill appearing.  Neck: JVP 7-8 Cor: Regular rate & rhythm, tachy. 3/6 systolic murmur Lungs: comfortable lying flat Abdomen: soft, nontender, nondistended.  Extremities: Trace edema Neuro: Alert. Makes eye  contact and mumbles.Unable to disern if oriented.   Telemetry   Sinus tach 100s, PVCs   Labs   Basic Metabolic Panel: Recent Labs  Lab 11/22/23 2230 11/23/23 0657 11/23/23 2129 11/24/23 0750 11/25/23 0336  NA 138 139 138 136 136  K 4.6 4.3 4.7 4.4 3.8  CL 104 104 104 102 102  CO2 21* 23 16* 19* 24  GLUCOSE 168* 168* 131* 136* 167*  BUN 20 20 19 19  24*  CREATININE 1.30* 1.24* 1.14* 1.25* 1.34*  CALCIUM  9.0 9.2 9.3 9.0 8.8*  MG 1.9 1.8 2.3 2.0 1.7    Liver Function Tests: Recent Labs  Lab 11/21/23 0815 11/22/23 0242 11/23/23 2129  AST 38 30 35  ALT 22 19 19   ALKPHOS 48 48 52  BILITOT 1.7* 1.6* 2.4*  PROT 6.9 6.1* 6.9  ALBUMIN 2.8* 2.6* 2.8*   Recent Labs  Lab 11/21/23 0815  LIPASE 65*   No results for input(s): AMMONIA in the last 168 hours.  CBC: Recent Labs  Lab 11/21/23 0815 11/22/23 0242 11/23/23 0657  WBC 8.3 7.6 9.8  NEUTROABS 5.6  --   --   HGB 11.1* 9.5* 10.3*  HCT 36.4 30.4* 32.4*  MCV 72.4* 70.9* 69.8*  PLT 254 195 196    Cardiac Enzymes: No results for input(s): CKTOTAL, CKMB, CKMBINDEX, TROPONINI in the last 168 hours.  BNP: BNP (last 3 results) Recent Labs    03/04/23 0848 11/21/23 0815  BNP 1,700.2* 2,858.8*    ProBNP (last 3 results) No results for input(s): PROBNP in the last 8760 hours.   CBG: Recent Labs  Lab 11/24/23 0601 11/24/23 1126 11/24/23 1523 11/24/23 2121 11/25/23 0617  GLUCAP 146* 178* 189* 175* 147*    Coagulation Studies: No results for input(s): LABPROT, INR in the last 72 hours.   Imaging   ECHOCARDIOGRAM COMPLETE Result Date: 11/24/2023    ECHOCARDIOGRAM REPORT   Patient Name:   Emma Hahn Date of Exam: 11/24/2023 Medical Rec #:  [id number]           Height:       64.0 in Accession #:    [id number]          Weight:       137.3 lb Date of Birth:  March 06, 1950  BSA:          1.668 m Patient Age:    73 years            BP:           123/64 mmHg Patient Gender: F                    HR:           105 bpm. Exam Location:  Inpatient Procedure: 2D Echo, Cardiac Doppler and Color Doppler (Both Spectral and Color            Flow Doppler were utilized during procedure). Indications:    Dyspnea R06.00  History:        Patient has prior history of Echocardiogram examinations, most                 recent 03/05/2023. CHF, COPD, Signs/Symptoms:Chest Pain; Risk                 Factors:Hypertension, Dyslipidemia and Current Smoker.  Sonographer:    Thea Norlander RCS Referring Phys: ALVAN CARRIER, F  Sonographer Comments: Image acquisition challenging due to patient behavioral factors. and Image acquisition challenging due to uncooperative patient. IMPRESSIONS  1. Left ventricular ejection fraction, by estimation, is 20 to 25%. The left ventricle has severely decreased function. The left ventricle demonstrates global hypokinesis. Left ventricular diastolic parameters are indeterminate. There is the interventricular septum is flattened in diastole ('D' shaped left ventricle), consistent with right ventricular volume overload.  2. Right ventricular systolic function is mildly reduced. The right ventricular size is normal. There is moderately elevated pulmonary artery systolic pressure.PASP likely underestimated in setting of severe tricuspid regurgitation.  3. Left atrial size was severely dilated.  4. Right atrial size was moderately dilated.  5. Eccentric posterior mitral regurgitation, eccentricity affects evaluation. Appears to be severe MR. . The mitral valve is abnormal. Severe mitral valve regurgitation. No evidence of mitral stenosis.  6. Systolic hepatic flow reversal consistent with severe tricuspid regurgitation. . The tricuspid valve is abnormal. Tricuspid valve regurgitation is severe.  7. The aortic valve has an indeterminant number of cusps. Aortic valve regurgitation is not visualized. No aortic stenosis is present.  8. The inferior vena cava is dilated in size with <50%  respiratory variability, suggesting right atrial pressure of 15 mmHg. FINDINGS  Left Ventricle: Left ventricular ejection fraction, by estimation, is 20 to 25%. The left ventricle has severely decreased function. The left ventricle demonstrates global hypokinesis. The left ventricular internal cavity size was normal in size. There is no left ventricular hypertrophy. The interventricular septum is flattened in diastole ('D' shaped left ventricle), consistent with right ventricular volume overload. Left ventricular diastolic parameters are indeterminate. Right Ventricle: The right ventricular size is normal. Right vetricular wall thickness was not well visualized. Right ventricular systolic function is mildly reduced. There is moderately elevated pulmonary artery systolic pressure. The tricuspid regurgitant velocity is 3.15 m/s, and with an assumed right atrial pressure of 15 mmHg, the estimated right ventricular systolic pressure is 54.7 mmHg. Left Atrium: Left atrial size was severely dilated. Right Atrium: Right atrial size was moderately dilated. Pericardium: Trivial pericardial effusion is present. The pericardial effusion is circumferential. Mitral Valve: Eccentric posterior mitral regurgitation, eccentricity affects evaluation. Appears to be severe MR. The mitral valve is abnormal. There is mild thickening of the mitral valve leaflet(s). There is mild calcification of the mitral valve leaflet(s). Mild mitral annular calcification. Severe mitral valve regurgitation. No evidence  of mitral valve stenosis. Tricuspid Valve: Systolic hepatic flow reversal consistent with severe tricuspid regurgitation. The tricuspid valve is abnormal. Tricuspid valve regurgitation is severe. No evidence of tricuspid stenosis. Aortic Valve: The aortic valve has an indeterminant number of cusps. Aortic valve regurgitation is not visualized. No aortic stenosis is present. Aortic valve mean gradient measures 3.7 mmHg. Aortic valve peak  gradient measures 10.8 mmHg. Aortic valve area, by VTI measures 1.63 cm. Pulmonic Valve: The pulmonic valve was not well visualized. Pulmonic valve regurgitation is mild. No evidence of pulmonic stenosis. Aorta: The aortic root and ascending aorta are structurally normal, with no evidence of dilitation. Venous: The inferior vena cava is dilated in size with less than 50% respiratory variability, suggesting right atrial pressure of 15 mmHg. IAS/Shunts: No atrial level shunt detected by color flow Doppler.  LEFT VENTRICLE PLAX 2D LVIDd:         5.40 cm   Diastology LVIDs:         4.60 cm   LV e' medial:    8.27 cm/s LV PW:         1.00 cm   LV E/e' medial:  18.1 LV IVS:        1.00 cm   LV e' lateral:   11.90 cm/s LVOT diam:     1.80 cm   LV E/e' lateral: 12.6 LV SV:         29 LV SV Index:   17 LVOT Area:     2.54 cm  RIGHT VENTRICLE            IVC RV S prime:     7.83 cm/s  IVC diam: 3.30 cm TAPSE (M-mode): 2.0 cm LEFT ATRIUM              Index        RIGHT ATRIUM           Index LA diam:        5.20 cm  3.12 cm/m   RA Area:     23.90 cm LA Vol (A2C):   106.0 ml 63.57 ml/m  RA Volume:   77.90 ml  46.71 ml/m LA Vol (A4C):   84.1 ml  50.43 ml/m LA Biplane Vol: 104.0 ml 62.37 ml/m  AORTIC VALVE AV Area (Vmax):    1.71 cm AV Area (Vmean):   1.91 cm AV Area (VTI):     1.63 cm AV Vmax:           163.99 cm/s AV Vmean:          84.398 cm/s AV VTI:            0.178 m AV Peak Grad:      10.8 mmHg AV Mean Grad:      3.7 mmHg LVOT Vmax:         110.00 cm/s LVOT Vmean:        63.500 cm/s LVOT VTI:          0.114 m LVOT/AV VTI ratio: 0.64  AORTA Ao Root diam: 3.40 cm Ao Asc diam:  3.20 cm MITRAL VALVE                TRICUSPID VALVE MV Area (PHT): 5.88 cm     TR Peak grad:   39.7 mmHg MV Decel Time: 129 msec     TR Vmax:        315.00 cm/s MV E velocity: 150.00 cm/s MV A velocity: 73.10 cm/s   SHUNTS MV E/A  ratio:  2.05         Systemic VTI:  0.11 m                             Systemic Diam: 1.80 cm Dorn Ross MD  Electronically signed by Dorn Ross MD Signature Date/Time: 11/24/2023/4:05:01 PM    Final      Medications:     Current Medications:  aspirin  EC  81 mg Oral Daily   atorvastatin   80 mg Oral Daily   chlordiazePOXIDE  25 mg Oral TID   Followed by   NOREEN ON 11/26/2023] chlordiazePOXIDE  25 mg Oral BH-qamhs   Followed by   NOREEN ON 11/27/2023] chlordiazePOXIDE  25 mg Oral Daily   Chlorhexidine Gluconate Cloth  6 each Topical Daily   fluticasone  furoate-vilanterol  1 puff Inhalation Daily   folic acid   1 mg Oral Daily   furosemide   60 mg Intravenous BID   insulin aspart  0-5 Units Subcutaneous QHS   insulin aspart  0-9 Units Subcutaneous TID WC   multivitamin with minerals  1 tablet Oral Daily   nicotine  7 mg Transdermal Daily   potassium chloride   40 mEq Oral Once   rivaroxaban  10 mg Oral Daily   sacubitril -valsartan   1 tablet Oral BID   sodium chloride  flush  10-40 mL Intracatheter Q12H    Infusions:  milrinone 0.375 mcg/kg/min (11/25/23 0910)   thiamine  (VITAMIN B1) injection Stopped (11/25/23 0650)     Assessment/Plan   1. Acute on chronic HFrEF with biventricular failure >>cardiogenic shock - NICM in the setting of severe MR and ETOH/crack cocaine abuse.  - Cath 11/21 no CAD - Multiple admissions this year in setting of noncompliance and ongoing substance abuse - Echo this admit: EF 20-25%, D shaped LV, RV mildly reduced, severe MR, severe TR, dilated IVC - Lactic acid up to 3.7, now cleared with inotropic support - Initial CO-OX 26%. Co-ox 41% this am on 0.25 milrinone, increased to 0.375 milrinone this am. Repeat CO-OX pending. - CVP 8. Has diuresed well with IV lasix . Continue one more day. Supp K and Mag. - Increase Entresto  to 49/51 mg BID - Start spiro 25 mg daily - No beta blocker with low-output - Likely end-stage. She is not a candidate for advanced therapies given social situation and noncompliance. Recommend Palliative Care involvement   2.  MR/TR - Severe MR and TR on echo. Likely functional - Declined mitral valve intervention in the past - No longer a candidate for procedures   3. ETOH Abuse - Longstanding issues - CIWA protocol   4 . Tobacco Abuse  - Ongoing. Cessation recommended  5. Crack/cocaine abuse - UDS + on admit  6. Hep C  - untreated     Length of Stay: 3  FINCH, LINDSAY N, PA-C  11/25/2023, 11:17 AM  Advanced Heart Failure Team Pager [phone] (M-F; 7a - 5p)  Please contact [facility] for night-coverage after hours (4p -7a ) and weekends on [email or web address]   Patient seen with PA, I formulated the plan and agree with the above note.   NICM in setting of substance abuse.  Patient was followed at CHF clinic in the past but lost to followup.  Admitted with CHF, cocaine+ and suspected to have ETOH withdrawal.  Initial co-ox markedly low, now on milrinone, increased to 0.375 with co-ox 41% today.  Lactate has cleared, 1.5 today.   She is lying in bed,  will groan but has no meaningful conversation with me.  CVP 11-12.   Echo reviewed, EF 20-25%, D-shaped septum, RV mildly reduced systolic function, severe MR, IVC dilated.   General: Lethargic.  Neck: JVP 10-12 cm, no thyromegaly or thyroid nodule.  Lungs: Clear to auscultation bilaterally with normal respiratory effort. CV: Nondisplaced PMI.  Heart regular S1/S2, no S3/S4, 2/6 HSM apex.  No peripheral edema.  No carotid bruit. Difficult to palpate pedal pulses.  Abdomen: Soft, nontender, no hepatosplenomegaly, no distention.  Skin: Intact without lesions or rashes.  Neurologic: Will follow some commands.  Extremities: No clubbing or cyanosis.  HEENT: Normal.   Cardiogenic shock with nonischemic cardiomyopathy.  Now on milrinone 0.375.  Still at least mildly volume overloaded with CVP 11-12 on my read.  - Continue milrinone 0.375, she will need repeat co-ox.  - Add digoxin 0.125 daily  - Increase Entresto  to 49/51 bid  - Add spironolactone  25 mg  daily. - Add digoxin as long as creatinine remains stable.  - Continue diuresis today, Lasix  80 mg IV bid and replace K.  - Not candidate for advanced therapies or home inotrope with active cocaine and ETOH abuse as well as noncompliance.  Will need to wean her off milrinone to po meds.   Suspected ETOH withdrawal, on CIWA protocol.   Urine + cocaine at admission.    Severe functional MR, has refused mTEER evaluation in the past.   Untreated HCV  Ezra Shuck 11/25/2023 1:48 PM

## 2023-11-25 NOTE — Progress Notes (Signed)
 Progress Note  Patient Name: Emma Hahn Date of Encounter: 11/25/2023 Primary Cardiologist: Stanly DELENA Leavens, MD   Subjective   Overnight started on milrinone and diuresis. Cox improved from 26.2 to 41.4; Lactate resolved. Diuresed -2.2 L.  Patient notes that she has no family and no one helps her with decisions.  Vital Signs    Vitals:   11/25/23 0405 11/25/23 0411 11/25/23 0620 11/25/23 0830  BP: 127/74  (!) 151/74   Pulse: 87  (!) 109 (!) 102  Resp: 20  18 18   Temp: (!) 97.5 F (36.4 C)  97.7 F (36.5 C)   TempSrc: Axillary  Oral   SpO2: 93%  93%   Weight:  61.2 kg    Height:        Intake/Output Summary (Last 24 hours) at 11/25/2023 0858 Last data filed at 11/24/2023 2322 Gross per 24 hour  Intake 740.49 ml  Output 450 ml  Net 290.49 ml   Filed Weights   11/23/23 0137 11/24/23 0400 11/25/23 0411  Weight: 64.4 kg 62.3 kg 61.2 kg    Physical Exam   GEN: No acute distress.  AOX person, place Neck: + JVD Cardiac: RRR, systolic murmur, no rubs, or gallops.  Respiratory: Clear to auscultation bilaterally. GI: Soft, nontender, non-distended  MS: warm +1 edema skinny legs  Labs  TELE: SR with PVCs   Chemistry Recent Labs  Lab 11/21/23 0815 11/21/23 1908 11/22/23 0242 11/22/23 2230 11/23/23 2129 11/24/23 0750 11/25/23 0336  NA 138   < > 135   < > 138 136 136  K 4.3   < > 3.5   < > 4.7 4.4 3.8  CL 103   < > 104   < > 104 102 102  CO2 22   < > 20*   < > 16* 19* 24  GLUCOSE 143*   < > 171*   < > 131* 136* 167*  BUN 23   < > 19   < > 19 19 24*  CREATININE 1.16*   < > 1.09*   < > 1.14* 1.25* 1.34*  CALCIUM  9.0   < > 8.6*   < > 9.3 9.0 8.8*  PROT 6.9  --  6.1*  --  6.9  --   --   ALBUMIN 2.8*  --  2.6*  --  2.8*  --   --   AST 38  --  30  --  35  --   --   ALT 22  --  19  --  19  --   --   ALKPHOS 48  --  48  --  52  --   --   BILITOT 1.7*  --  1.6*  --  2.4*  --   --   GFRNONAA 49*   < > 53*   < > 51* 45* 42*  ANIONGAP 13   < > 11    < > 18* 15 10   < > = values in this interval not displayed.     Hematology Recent Labs  Lab 11/21/23 0815 11/22/23 0242 11/23/23 0657  WBC 8.3 7.6 9.8  RBC 5.03 4.29 4.64  HGB 11.1* 9.5* 10.3*  HCT 36.4 30.4* 32.4*  MCV 72.4* 70.9* 69.8*  MCH 22.1* 22.1* 22.2*  MCHC 30.5 31.3 31.8  RDW 20.1* 19.5* 20.0*  PLT 254 195 196    Cardiac EnzymesNo results for input(s): TROPONINI in the last 168 hours. No results for input(s): TROPIPOC in  the last 168 hours.   BNP Recent Labs  Lab 11/21/23 0815  BNP 2,858.8*     DDimer No results for input(s): DDIMER in the last 168 hours.   Cardiac Studies   Cardiac Studies & Procedures   ______________________________________________________________________________________________ CARDIAC CATHETERIZATION  CARDIAC CATHETERIZATION 12/08/2019  Conclusion Mild - moderate right heart pressure elevation with mild pulmonary hypertension (mean PA 32 mmHg).  Normal coronary arteries.  Echo documentation of EF at 25 to 30%.  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  Smoking cessation.  Findings Coronary Findings Diagnostic  Dominance: Co-dominant  No diagnostic findings have been documented. Intervention  No interventions have been documented.     ECHOCARDIOGRAM  ECHOCARDIOGRAM COMPLETE 11/24/2023  Narrative ECHOCARDIOGRAM REPORT    Patient Name:   Emma Hahn Date of Exam: 11/24/2023 Medical Rec #:  [id number]           Height:       64.0 in Accession #:    [id number]          Weight:       137.3 lb Date of Birth:  1950-09-18           BSA:          1.668 m Patient Age:    73 years            BP:           123/64 mmHg Patient Gender: F                   HR:           105 bpm. Exam Location:  Inpatient  Procedure: 2D Echo, Cardiac Doppler and Color Doppler (Both Spectral and Color Flow  Doppler were utilized during procedure).  Indications:    Dyspnea R06.00  History:        Patient has prior history of Echocardiogram examinations, most recent 03/05/2023. CHF, COPD, Signs/Symptoms:Chest Pain; Risk Factors:Hypertension, Dyslipidemia and Current Smoker.  Sonographer:    Thea Norlander RCS Referring Phys: ALVAN CARRIER, F   Sonographer Comments: Image acquisition challenging due to patient behavioral factors. and Image acquisition challenging due to uncooperative patient. IMPRESSIONS   1. Left ventricular ejection fraction, by estimation, is 20 to 25%. The left ventricle has severely decreased function. The left ventricle demonstrates global hypokinesis. Left ventricular diastolic parameters are indeterminate. There is the interventricular septum is flattened in diastole ('D' shaped left ventricle), consistent with right ventricular volume overload. 2. Right ventricular systolic function is mildly reduced. The right ventricular size is normal. There is moderately elevated pulmonary artery systolic pressure.PASP likely underestimated in setting of severe tricuspid regurgitation. 3. Left atrial size was severely dilated. 4. Right atrial size was moderately dilated. 5. Eccentric posterior mitral regurgitation, eccentricity affects evaluation. Appears to be severe MR. . The mitral valve is abnormal. Severe mitral valve regurgitation. No evidence of mitral stenosis. 6. Systolic hepatic flow reversal consistent with severe tricuspid regurgitation. . The tricuspid valve is abnormal. Tricuspid valve regurgitation is severe. 7. The aortic valve has an indeterminant number of cusps. Aortic valve regurgitation is not visualized. No aortic stenosis is present. 8. The inferior vena cava is dilated in size with <50% respiratory variability, suggesting right atrial pressure of 15 mmHg.  FINDINGS Left Ventricle: Left ventricular ejection fraction, by estimation, is 20 to 25%. The left  ventricle has severely decreased function. The left ventricle demonstrates global hypokinesis.  The left ventricular internal cavity size was normal in size. There is no left ventricular hypertrophy. The interventricular septum is flattened in diastole ('D' shaped left ventricle), consistent with right ventricular volume overload. Left ventricular diastolic parameters are indeterminate.  Right Ventricle: The right ventricular size is normal. Right vetricular wall thickness was not well visualized. Right ventricular systolic function is mildly reduced. There is moderately elevated pulmonary artery systolic pressure. The tricuspid regurgitant velocity is 3.15 m/s, and with an assumed right atrial pressure of 15 mmHg, the estimated right ventricular systolic pressure is 54.7 mmHg.  Left Atrium: Left atrial size was severely dilated.  Right Atrium: Right atrial size was moderately dilated.  Pericardium: Trivial pericardial effusion is present. The pericardial effusion is circumferential.  Mitral Valve: Eccentric posterior mitral regurgitation, eccentricity affects evaluation. Appears to be severe MR. The mitral valve is abnormal. There is mild thickening of the mitral valve leaflet(s). There is mild calcification of the mitral valve leaflet(s). Mild mitral annular calcification. Severe mitral valve regurgitation. No evidence of mitral valve stenosis.  Tricuspid Valve: Systolic hepatic flow reversal consistent with severe tricuspid regurgitation. The tricuspid valve is abnormal. Tricuspid valve regurgitation is severe. No evidence of tricuspid stenosis.  Aortic Valve: The aortic valve has an indeterminant number of cusps. Aortic valve regurgitation is not visualized. No aortic stenosis is present. Aortic valve mean gradient measures 3.7 mmHg. Aortic valve peak gradient measures 10.8 mmHg. Aortic valve area, by VTI measures 1.63 cm.  Pulmonic Valve: The pulmonic valve was not well visualized. Pulmonic  valve regurgitation is mild. No evidence of pulmonic stenosis.  Aorta: The aortic root and ascending aorta are structurally normal, with no evidence of dilitation.  Venous: The inferior vena cava is dilated in size with less than 50% respiratory variability, suggesting right atrial pressure of 15 mmHg.  IAS/Shunts: No atrial level shunt detected by color flow Doppler.   LEFT VENTRICLE PLAX 2D LVIDd:         5.40 cm   Diastology LVIDs:         4.60 cm   LV e' medial:    8.27 cm/s LV PW:         1.00 cm   LV E/e' medial:  18.1 LV IVS:        1.00 cm   LV e' lateral:   11.90 cm/s LVOT diam:     1.80 cm   LV E/e' lateral: 12.6 LV SV:         29 LV SV Index:   17 LVOT Area:     2.54 cm   RIGHT VENTRICLE            IVC RV S prime:     7.83 cm/s  IVC diam: 3.30 cm TAPSE (M-mode): 2.0 cm  LEFT ATRIUM              Index        RIGHT ATRIUM           Index LA diam:        5.20 cm  3.12 cm/m   RA Area:     23.90 cm LA Vol (A2C):   106.0 ml 63.57 ml/m  RA Volume:   77.90 ml  46.71 ml/m LA Vol (A4C):   84.1 ml  50.43 ml/m LA Biplane Vol: 104.0 ml 62.37 ml/m AORTIC VALVE AV Area (Vmax):    1.71 cm AV Area (Vmean):   1.91 cm AV Area (VTI):     1.63 cm AV Vmax:  163.99 cm/s AV Vmean:          84.398 cm/s AV VTI:            0.178 m AV Peak Grad:      10.8 mmHg AV Mean Grad:      3.7 mmHg LVOT Vmax:         110.00 cm/s LVOT Vmean:        63.500 cm/s LVOT VTI:          0.114 m LVOT/AV VTI ratio: 0.64  AORTA Ao Root diam: 3.40 cm Ao Asc diam:  3.20 cm  MITRAL VALVE                TRICUSPID VALVE MV Area (PHT): 5.88 cm     TR Peak grad:   39.7 mmHg MV Decel Time: 129 msec     TR Vmax:        315.00 cm/s MV E velocity: 150.00 cm/s MV A velocity: 73.10 cm/s   SHUNTS MV E/A ratio:  2.05         Systemic VTI:  0.11 m Systemic Diam: 1.80 cm  Dorn Ross MD Electronically signed by Dorn Ross MD Signature Date/Time: 11/24/2023/4:05:01 PM    Final    TEE  ECHO TEE 12/09/2019  Narrative TRANSESOPHOGEAL ECHO REPORT    Patient Name:   Emma Hahn Date of Exam: 12/09/2019 Medical Rec #:  [id number]    Height:       64.0 in Accession #:    [id number]   Weight:       145.7 lb Date of Birth:  1950-04-09    BSA:          1.710 m Patient Age:    69 years     BP:           117/71 mmHg Patient Gender: F            HR:           92 bpm. Exam Location:  Inpatient  Procedure: Transesophageal Echo  Indications:    Mitral regurgitation  History:        Patient has prior history of Echocardiogram examinations, most recent 12/06/2019. COPD; Risk Factors:Current Smoker and ETOH.  Sonographer:    Lauraine Pilot RDCS Referring Phys: 61 DAYNA N DUNN  PROCEDURE: After discussion of the risks and benefits of a TEE, an informed consent was obtained from the patient. The transesophogeal probe was passed without difficulty through the esophogus of the patient. Local oropharyngeal anesthetic was provided with Cetacaine . Sedation performed by different physician. The patient was monitored while under deep sedation. Anesthestetic sedation was provided intravenously by Anesthesiology: 152.37mg  of Propofol , 100mg  of Lidocaine . The patient's vital signs; including heart rate, blood pressure, and oxygen saturation; remained stable throughout the procedure. The patient developed no complications during the procedure.  IMPRESSIONS   1. Left ventricular ejection fraction, by estimation, is 25 to 30%. The left ventricle has severely decreased function. The left ventricle demonstrates global hypokinesis. There is mild left ventricular hypertrophy. Left ventricular diastolic function could not be evaluated. 2. Right ventricular systolic function is normal. The right ventricular size is normal. 3. Left atrial size was moderately dilated. No left atrial/left atrial appendage thrombus was detected. 4. Right atrial size was moderately dilated. 5. The pericardial  effusion is circumferential. 6. Mild mitral subvalvular thickening/fibrosis. 7. The mitral valve is myxomatous. Severe mitral valve regurgitation. 8. The tricuspid valve is abnormal. Tricuspid valve regurgitation is moderate. 9. The  aortic valve is tricuspid. Aortic valve regurgitation is not visualized. 10. There is mild (Grade II) layered plaque involving the transverse and descending aorta.  Comparison(s): A prior study was performed on 12/06/2019. LVEF unchanged at 25-30%, severe MR.  Conclusion(s)/Recommendation(s): Recommend evaluation by structural heart team for possible mitraclip.  FINDINGS Left Ventricle: Left ventricular ejection fraction, by estimation, is 25 to 30%. The left ventricle has severely decreased function. The left ventricle demonstrates global hypokinesis. The left ventricular internal cavity size was normal in size. There is mild left ventricular hypertrophy. Left ventricular diastolic function could not be evaluated.  Right Ventricle: The right ventricular size is normal. No increase in right ventricular wall thickness. Right ventricular systolic function is normal.  Left Atrium: Left atrial size was moderately dilated. Spontaneous echo contrast was present in the left atrium. No left atrial/left atrial appendage thrombus was detected.  Right Atrium: Right atrial size was moderately dilated.  Pericardium: Trivial pericardial effusion is present. The pericardial effusion is circumferential.  Mitral Valve: The mitral valve is myxomatous. There is moderate thickening of the mitral valve leaflet(s). Moderately decreased mobility of the mitral valve leaflets. Mild mitral subvalvular thickening/fibrosis. There is mild chordal rupture of the posterior leaflet of the mitral valve. Severe mitral valve regurgitation, with eccentric posteriorly directed jet.  Tricuspid Valve: The tricuspid valve is abnormal. Tricuspid valve regurgitation is moderate.  Aortic Valve: The  aortic valve is tricuspid. Aortic valve regurgitation is not visualized.  Pulmonic Valve: The pulmonic valve was grossly normal. Pulmonic valve regurgitation is mild.  Aorta: The aortic root and ascending aorta are structurally normal, with no evidence of dilitation. There is mild (Grade II) layered plaque involving the transverse and descending aorta.  Venous: The pulmonary veins were not well visualized. A pattern of systolic flow reversal, suggestive of severe mitral regurgitation is recorded from the left upper pulmonary vein.  IAS/Shunts: No atrial level shunt detected by color flow Doppler.   LEFT VENTRICLE PLAX 2D LVOT diam:     1.70 cm LVOT Area:     2.27 cm   MR Peak grad:    157.0 mmHg MR Mean grad:    92.0 mmHg   SHUNTS MR Vmax:         626.50 cm/s Systemic Diam: 1.70 cm MR Vmean:        438.5 cm/s MR PISA:         3.08 cm MR PISA Eff ROA: 19 mm MR PISA Radius:  0.70 cm  Vinie Maxcy MD Electronically signed by Vinie Maxcy MD Signature Date/Time: 12/09/2019/3:11:14 PM    Final      CARDIAC MRI  MR CARDIAC MORPHOLOGY W WO CONTRAST 07/13/2021  Narrative CLINICAL DATA:  28F with HFrEF (EF 30-35%), moderate to severe MR, normal coronary arteries  EXAM: CARDIAC MRI  TECHNIQUE: The patient was scanned on a 1.5 Tesla Siemens magnet. A dedicated cardiac coil was used. Functional imaging was done using Fiesta sequences. 2,3, and 4 chamber views were done to assess for RWMA's. Modified Simpson's rule using a short axis stack was used to calculate an ejection fraction on a dedicated work [employer]. The patient received 9 cc of Gadavist . After 10 minutes inversion recovery sequences were used to assess for infiltration and scar tissue.  CONTRAST:  9 cc  of Gadavist   FINDINGS: Left ventricle:  -Mild dilatation  -Severe systolic dysfunction  -Elevated ECV (34%)  -Normal T2  -Basal septal midwall LGE  -RV insertion site  LGE  LV EF:  22% (Normal 56-78%)  Absolute volumes:  LV EDV: (Normal 52-141 mL)  LV ESV: (Normal 13-51 mL)  LV SV: 45mL (Normal 33-97 mL)  CO: 3.5L/min (Normal 2.7-6.0 L/min)  Indexed volumes:  LV EDV: 132mL/sq-m (Normal 41-81 mL/sq-m)  LV ESV: 38mL/sq-m (Normal 12-21 mL/sq-m)  LV SV: 71mL/sq-m (Normal 26-56 mL/sq-m)  CI: 2.1L/min/sq-m (Normal 1.8-3.8 L/min/sq-m)  Right ventricle: Normal size with moderate systolic dysfunction  RV EF: 35% (Normal 47-80%)  Absolute volumes:  RV EDV: 96mL (Normal 58-154 mL)  RV ESV: 32mL (Normal 12-68 mL)  RV SV: 34mL (Normal 35-98 mL)  CO: 2.6L/min (Normal 2.7-6 L/min)  Indexed volumes:  RV EDV: 58mL/sq-m (Normal 48-87 mL/sq-m)  RV ESV: 21mL/sq-m (Normal 11-28 mL/sq-m)  RV SV: 28mL/sq-m (Normal 27-57 mL/sq-m)  CI: 1.5L/min/sq-m (Normal 1.8-3.8 L/min/sq-m)  Left atrium: Mild enlargement  Right atrium: Normal size  Mitral valve: Moderate regurgitation (regurgitant fraction 33%)  Aortic valve: Tricuspid.  No regurgitation  Tricuspid valve: Mild regurgitation  Pulmonic valve: No regurgitation  Aorta: Normal proximal ascending aorta  Pericardium: Moderate effusion measuring 14mm adjacent to LV lateral wall  IMPRESSION: 1.  Mild LV dilatation with severe systolic dysfunction (EF 22%)  2.  Normal RV size with moderate systolic dysfunction (EF 35%)  3. Basal septal midwall late gadolinium enhancement, which is a scar pattern seen in nonischemic cardiomyopathies and associated with a worse prognosis  4. RV insertion site LGE, which is a nonspecific scar pattern often seen in setting of elevated pulmonary pressures  5. Moderate pericardial effusion measuring 14mm adjacent to LV lateral wall  6.  Moderate mitral regurgitation (regurgitant fraction 33%)   Electronically Signed By: Lonni Nanas M.D. 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 Ranae note) will f/u with them today - I am concerned that she is on inotropes with out a bridge to long term intervention; she has poly substance abuse and is undergoing alcohol with draw; recommend PC consult and GOC discussion, this could be difficult with family (she has a daughter in [place] but conversation has been difficult per report)  Polysubstance abuse - agree with CIWA and nicotine patches, I worry she would be a poor LVAD candidate (social support issues)   For questions or updates, please contact [facility] Please consult [email or web address] for contact info under Cardiology/STEMI.      Stanly Leavens, MD FASE [facility] Cardiologist [facility]  [address] [place], [address] [place], [place] [address] [phone]  8:58 AM

## 2023-11-25 NOTE — Progress Notes (Signed)
 73 year old admitted for heart failure assessed at bedside for agitation. She's upset and pointing at the wall, insisting I give her the towels there, but there are none. She is frustrated and demands I leave. She declines physical assessment at this time but I can see her HR on the monitor is ~110 and last BP was ~140/80. Respiratory rate is ~30. Her feet are somewhat cool. She makes weak attempts to leave the bed before falling back, exhausted. Oriented to self. She's being treated for heart failure with milrinone and Lasix , I note the concern for cardiogenic shock earlier in the day. She's being treated for alcohol withdrawal with chlordiazepoxide taper. Vital signs are stable now from earlier in the day. She scores 12 on CIWA by my assessment for anxiety, agitation, hallucination, and disorientation. Reasonable to give a dose of PRN chlordiazepoxide now, but want to avoid oversedation in case shock is worsening.  Ozell Kung MD 11/25/2023, 2:08 AM

## 2023-11-25 NOTE — TOC Progression Note (Signed)
 Transition of Care [facility]) - Progression Note    Patient Details  Name: Emma Hahn MRN: [id number] Date of Birth: Sep 02, 1950  Transition of [facility]) CM/SW Contact  Arlana JINNY Nicholaus [place] Phone Number: [phone] 11/25/2023, 2:04 PM  Clinical Narrative:   HF CSW attempted to meet with patient at bedside. Patient was sleeping prior to entering in room. Patient appeared restless and slurred her words making it difficult to understand. CSW will come back at a later time.   HF CSW/CM will continue to follow and monitor for dc readiness.     Expected Discharge Plan:  (Working on SNF but unclear if patient wants to do it) Barriers to Discharge: Continued Medical Work up, SNF Pending bed offer               Expected Discharge Plan and Services In-house Referral: Clinical Social Work Discharge Planning Services: CM Consult   Living arrangements for the past 2 months: Single Family Home                                       Social Drivers of Health (SDOH) Interventions SDOH Screenings   Food Insecurity: No Food Insecurity (11/21/2023)  Housing: Low Risk  (11/21/2023)  Transportation Needs: No Transportation Needs (11/21/2023)  Utilities: Not At Risk (11/21/2023)  Financial Resource Strain: Medium Risk (07/11/2021)  Social Connections: Unknown (11/21/2023)  Tobacco Use: High Risk (11/06/2023)   [facility] from [facility]    Readmission Risk Interventions    07/14/2021    2:13 PM  Readmission Risk Prevention Plan  Post Dischage Appt Complete  Medication Screening Complete  Transportation Screening Complete

## 2023-11-26 DIAGNOSIS — Z515 Encounter for palliative care: Secondary | ICD-10-CM | POA: Diagnosis not present

## 2023-11-26 DIAGNOSIS — Z66 Do not resuscitate: Secondary | ICD-10-CM | POA: Diagnosis not present

## 2023-11-26 DIAGNOSIS — R4589 Other symptoms and signs involving emotional state: Secondary | ICD-10-CM

## 2023-11-26 DIAGNOSIS — Z7189 Other specified counseling: Secondary | ICD-10-CM | POA: Diagnosis not present

## 2023-11-26 DIAGNOSIS — Z789 Other specified health status: Secondary | ICD-10-CM

## 2023-11-26 DIAGNOSIS — R570 Cardiogenic shock: Secondary | ICD-10-CM | POA: Diagnosis not present

## 2023-11-26 DIAGNOSIS — I5021 Acute systolic (congestive) heart failure: Secondary | ICD-10-CM | POA: Diagnosis not present

## 2023-11-26 DIAGNOSIS — I5043 Acute on chronic combined systolic (congestive) and diastolic (congestive) heart failure: Secondary | ICD-10-CM | POA: Diagnosis not present

## 2023-11-26 LAB — BASIC METABOLIC PANEL WITH GFR
Anion gap: 13 (ref 5–15)
BUN: 18 mg/dL (ref 8–23)
CO2: 23 mmol/L (ref 22–32)
Calcium: 8.4 mg/dL — ABNORMAL LOW (ref 8.9–10.3)
Chloride: 103 mmol/L (ref 98–111)
Creatinine, Ser: 1.25 mg/dL — ABNORMAL HIGH (ref 0.44–1.00)
GFR, Estimated: 45 mL/min — ABNORMAL LOW (ref >60)
Glucose, Bld: 113 mg/dL — ABNORMAL HIGH (ref 70–99)
Potassium: 3.2 mmol/L — ABNORMAL LOW (ref 3.5–5.1)
Sodium: 139 mmol/L (ref 135–145)

## 2023-11-26 LAB — CBC
HCT: 31.7 % — ABNORMAL LOW (ref 36.0–46.0)
Hemoglobin: 10.3 g/dL — ABNORMAL LOW (ref 12.0–15.0)
MCH: 22.2 pg — ABNORMAL LOW (ref 26.0–34.0)
MCHC: 32.5 g/dL (ref 30.0–36.0)
MCV: 68.5 fL — ABNORMAL LOW (ref 80.0–100.0)
Platelets: 190 K/uL (ref 150–400)
RBC: 4.63 MIL/uL (ref 3.87–5.11)
RDW: 20.5 % — ABNORMAL HIGH (ref 11.5–15.5)
WBC: 8.7 K/uL (ref 4.0–10.5)
nRBC: 0 % (ref 0.0–0.2)

## 2023-11-26 LAB — COOXEMETRY PANEL
Carboxyhemoglobin: 2.4 % — ABNORMAL HIGH (ref 0.5–1.5)
Carboxyhemoglobin: 2.5 % — ABNORMAL HIGH (ref 0.5–1.5)
Methemoglobin: <0.7 % (ref 0.0–1.5)
Methemoglobin: 0.8 % (ref 0.0–1.5)
O2 Saturation: 63.1 %
O2 Saturation: 70.6 %
Total hemoglobin: 10.4 g/dL — ABNORMAL LOW (ref 12.0–16.0)
Total hemoglobin: 10.8 g/dL — ABNORMAL LOW (ref 12.0–16.0)

## 2023-11-26 LAB — GLUCOSE, CAPILLARY
Glucose-Capillary: 135 mg/dL — ABNORMAL HIGH (ref 70–99)
Glucose-Capillary: 130 mg/dL — ABNORMAL HIGH (ref 70–99)
Glucose-Capillary: 119 mg/dL — ABNORMAL HIGH (ref 70–99)

## 2023-11-26 LAB — AMMONIA: Ammonia: 16 umol/L (ref 9–35)

## 2023-11-26 LAB — MAGNESIUM: Magnesium: 1.6 mg/dL — ABNORMAL LOW (ref 1.7–2.4)

## 2023-11-26 MED ORDER — ONDANSETRON HCL 4 MG/2ML IJ SOLN: 4.0000 mg | Freq: Four times a day (QID) | INTRAMUSCULAR | Status: DC | PRN

## 2023-11-26 MED ORDER — HALOPERIDOL LACTATE 5 MG/ML IJ SOLN
0.5000 mg | INTRAMUSCULAR | Status: DC | PRN
Start: 2023-11-26 — End: 2023-12-09

## 2023-11-26 MED ORDER — GLYCOPYRROLATE 1 MG PO TABS: 1.0000 mg | ORAL_TABLET | ORAL | Status: DC | PRN

## 2023-11-26 MED ORDER — HALOPERIDOL 0.5 MG PO TABS
0.5000 mg | ORAL_TABLET | ORAL | Status: DC | PRN
Start: 2023-11-26 — End: 2023-12-09

## 2023-11-26 MED ORDER — MORPHINE SULFATE (PF) 2 MG/ML IV SOLN
1.0000 mg | INTRAVENOUS | Status: DC | PRN
  Administered 2023-11-26 – 2023-11-28 (×5): 2 mg via INTRAVENOUS
  Filled 2023-11-26 (×5): qty 1

## 2023-11-26 MED ORDER — HALOPERIDOL LACTATE 2 MG/ML PO CONC: 0.5000 mg | ORAL | Status: DC | PRN

## 2023-11-26 MED ORDER — ONDANSETRON 4 MG PO TBDP: 4.0000 mg | ORAL_TABLET | Freq: Four times a day (QID) | ORAL | Status: DC | PRN

## 2023-11-26 MED ORDER — POTASSIUM CHLORIDE 20 MEQ PO PACK: 40.0000 meq | PACK | Freq: Two times a day (BID) | ORAL | Status: DC

## 2023-11-26 MED ORDER — GLYCOPYRROLATE 0.2 MG/ML IJ SOLN: 0.2000 mg | INTRAMUSCULAR | Status: DC | PRN

## 2023-11-26 MED ORDER — LORAZEPAM 2 MG/ML IJ SOLN
1.0000 mg | INTRAMUSCULAR | Status: DC | PRN
Start: 2023-11-26 — End: 2023-12-09
  Administered 2023-11-27 – 2023-12-05 (×6): 1 mg via INTRAVENOUS
  Filled 2023-11-26 (×6): qty 1

## 2023-11-26 MED ORDER — BIOTENE DRY MOUTH MT LIQD: 15.0000 mL | OROMUCOSAL | Status: DC | PRN

## 2023-11-26 MED ORDER — POLYVINYL ALCOHOL 1.4 % OP SOLN: 1.0000 [drp] | Freq: Four times a day (QID) | OPHTHALMIC | Status: DC | PRN

## 2023-11-26 MED ORDER — DIGOXIN 125 MCG PO TABS: 0.0625 mg | ORAL_TABLET | Freq: Every day | ORAL | Status: DC

## 2023-11-26 MED ORDER — LORAZEPAM 1 MG PO TABS
1.0000 mg | ORAL_TABLET | ORAL | Status: DC | PRN
Start: 2023-11-26 — End: 2023-12-09

## 2023-11-26 MED ORDER — GLYCOPYRROLATE 0.2 MG/ML IJ SOLN
0.2000 mg | INTRAMUSCULAR | Status: DC | PRN
Start: 2023-11-26 — End: 2023-12-09

## 2023-11-26 MED ORDER — MAGNESIUM SULFATE 4 GM/100ML IV SOLN
4.0000 g | Freq: Once | INTRAVENOUS | Status: AC
  Administered 2023-11-26: 4 g via INTRAVENOUS
  Filled 2023-11-26: qty 100

## 2023-11-26 MED ORDER — LORAZEPAM 2 MG/ML PO CONC
1.0000 mg | ORAL | Status: DC | PRN
  Administered 2023-11-28: 1 mg via SUBLINGUAL
  Filled 2023-11-26: qty 1

## 2023-11-26 NOTE — Progress Notes (Signed)
 Physical Therapy Treatment Patient Details Name: Emma Hahn MRN: [id number] DOB: 1950/05/27 Today's Date: 11/26/2023   History of Present Illness 73 yo F adm 11/21/23 with AMS, SOB after cocaine use.PMHx: HLD, HTN, COPD, CHF, polysubstance abuse, CKD    PT Comments  Pt with increased lethargy, increased slurred speech, more assist for all transfers this session and decreased attention. Pt able to sit EOB and attempt standing but could not successfully maintain attention or support self in standing. Pt with decreased activity tolerance and function from eval with return to supine. Pt could not get liquid through straw to drink attention vs strength and when attempting to eat bite of banana would not complete chewing and swallowing with pt ultimately spitting up bite, RN aware.  Patient will benefit from continued inpatient follow up therapy, <3 hours/day  SPO2 91% on RA HR 111   If plan is discharge home, recommend the following: Assistance with cooking/housework;Direct supervision/assist for financial management;Supervision due to cognitive status;Assist for transportation;A lot of help with walking and/or transfers;A lot of help with bathing/dressing/bathroom   Can travel by private vehicle     No  Equipment Recommendations  Rolling walker (2 wheels)    Recommendations for Other Services       Precautions / Restrictions Precautions Precautions: Fall Recall of Precautions/Restrictions: Impaired     Mobility  Bed Mobility Overal bed mobility: Needs Assistance Bed Mobility: Supine to Sit, Sit to Supine     Supine to sit: Mod assist Sit to supine: Min assist   General bed mobility comments: mod assist to pivot to EOB and elevate trunk. After 3 min pt able to sit unassisted EOB with CGA limited by impaired cognition. min assist to return to supine    Transfers Overall transfer level: Needs assistance   Transfers: Sit to/from Stand Sit to Stand: Mod assist            General transfer comment: mod assist to stand from EOB x 2 trials with assist to initiate movement and power up to standing. Pt maintaining partially flexed posture and unable to weight shift to step    Ambulation/Gait               General Gait Details: unable this date   Stairs             Wheelchair Mobility     Tilt Bed    Modified Rankin (Stroke Patients Only)       Balance Overall balance assessment: Needs assistance Sitting-balance support: No upper extremity supported, Feet supported Sitting balance-Leahy Scale: Fair Sitting balance - Comments: initial min assist with progression to CGA   Standing balance support: During functional activity, Single extremity supported Standing balance-Leahy Scale: Poor Standing balance comment: reliant on physical support in standing                            Communication Communication Communication: Impaired Factors Affecting Communication: Reduced clarity of speech  Cognition Arousal: Lethargic Behavior During Therapy: Flat affect   PT - Cognitive impairments: Safety/Judgement, Memory, Orientation, Problem solving, Awareness, Difficult to assess Difficult to assess due to: Impaired communication                     PT - Cognition Comments: pt oriented to self and place, limited ability to assess cognition due to lethargy and slurred speech Following commands: Impaired Following commands impaired: Follows one step commands inconsistently, Follows one step  commands with increased time    Cueing Cueing Techniques: Verbal cues, Gestural cues, Tactile cues  Exercises      General Comments        Pertinent Vitals/Pain Pain Assessment Pain Assessment: CPOT Facial Expression: Relaxed, neutral Body Movements: Absence of movements Muscle Tension: Relaxed Compliance with ventilator (intubated pts.): N/A Vocalization (extubated pts.): Talking in normal tone or no sound CPOT Total:  0 Pain Intervention(s): Repositioned    Home Living                          Prior Function            PT Goals (current goals can now be found in the care plan section) Progress towards PT goals: Not progressing toward goals - comment (increased lethargy, decreased command following, impaired ability to stand)    Frequency    Min 2X/week      PT Plan      Co-evaluation              AM-PAC PT 6 Clicks Mobility   Outcome Measure  Help needed turning from your back to your side while in a flat bed without using bedrails?: A Lot Help needed moving from lying on your back to sitting on the side of a flat bed without using bedrails?: A Lot Help needed moving to and from a bed to a chair (including a wheelchair)?: Total Help needed standing up from a chair using your arms (e.g., wheelchair or bedside chair)?: Total Help needed to walk in hospital room?: Total Help needed climbing 3-5 steps with a railing? : Total 6 Click Score: 8    End of Session   Activity Tolerance: Patient limited by lethargy Patient left: in bed;with call bell/phone within reach;with bed alarm set;with nursing/sitter in room Nurse Communication: Mobility status PT Visit Diagnosis: Other abnormalities of gait and mobility (R26.89);Difficulty in walking, not elsewhere classified (R26.2)     Time: 9264-9245 PT Time Calculation (min) (ACUTE ONLY): 19 min  Charges:    $Therapeutic Activity: 8-22 mins PT General Charges $$ ACUTE PT VISIT: 1 Visit                     Lenoard SQUIBB, PT Acute Rehabilitation Services Office: [phone]    Emma Hahn 11/26/2023, 10:08 AM

## 2023-11-26 NOTE — Progress Notes (Signed)
   Subjective: Emma Hahn is a 73 y.o. female with PMH of polysubtance use disorder, chronic combined systolic and diastolic heart failure, COPD, chronic kidney disease stage 3b, substance use disorder, recurrent hospitalizations, who was recently discharged from [facility] for exacerbation of her chronic HF. Found in [place] confused, brought to ED and admitted for acute on chronic HFrEF. Hospitalization has been complicated by development of cardiogenic shock and encephalopathy.   Today, patient was minimally responsive.    Objective:  Vital signs in last 24 hours: Vitals:   11/26/23 0800 11/26/23 1003 11/26/23 1112 11/26/23 1200  BP:   (!) 102/57 (!) 102/59  Pulse: (!) 109 (!) 111 (!) 101 100  Resp: 17  (!) 21 15  Temp:   97.8 F (36.6 C) 97.6 F (36.4 C)  TempSrc:   Oral Oral  SpO2: 93% 91% 94% 92%  Weight:      Height:       Physical Exam: General:Appears ill, laying in bed, minimally responsive, breathing with her mouth gaped open.  Cardiac:Tachycardia present Pulmonary:normal effort on room air  Neuro:Open eyes to physical stimuli, does not participate in conversation, intermittently follows commands  MSK:no edema appreciated today      Latest Ref Rng & Units 11/26/2023    4:25 AM 11/23/2023    6:57 AM 11/22/2023    2:42 AM  CBC  WBC 4.0 - 10.5 K/uL 8.7  9.8  7.6   Hemoglobin 12.0 - 15.0 g/dL 89.6  89.6  9.5   Hematocrit 36.0 - 46.0 % 31.7  32.4  30.4   Platelets 150 - 400 K/uL 190  196  195         Latest Ref Rng & Units 11/26/2023    4:25 AM 11/25/2023    3:36 AM 11/24/2023    7:50 AM  BMP  Glucose 70 - 99 mg/dL 886  832  863   BUN 8 - 23 mg/dL 18  24  19    Creatinine 0.44 - 1.00 mg/dL 8.74  8.65  8.74   Sodium 135 - 145 mmol/L 139  136  136   Potassium 3.5 - 5.1 mmol/L 3.2  3.8  4.4   Chloride 98 - 111 mmol/L 103  102  102   CO2 22 - 32 mmol/L 23  24  19    Calcium  8.9 - 10.3 mg/dL 8.4  8.8  9.0       Assessment/Plan:  Principal Problem:   Acute congestive heart failure (HCC) Active Problems:   Alcohol use disorder   Cocaine use   Cocaine abuse (HCC)   Acute systolic (congestive) heart failure (HCC)   Encephalopathy   Cardiogenic shock (HCC)  Acute on chronic biventricular sytolic heart failure Nonischemic cardiomyopathy per [facility] in 2021 Severe mitral regurgitation )02/2023 Lactic Acidosis Encephalopathy Goals of care conversation with family this afternoon: family elected to transition to comfort care. Anticipate in hospital death at this time.  Plan: -Comfort care orders placed with PMT -Appreciate PMT's   DM Polysubstance use disorder CIWA with ativan  Cocaine Use  COPD  HTN HLD CAD Hx Hepatitis C, untreated Patient transitioned to comfort care. See above   Resolved Problems:  __________________________________  Code Status: Comfort care  VTE Prophylaxis:Comfort care   Kandis Perkins, DO 11/26/2023, 2:40 PM Please contact the on call pager at: [phone]

## 2023-11-26 NOTE — Progress Notes (Signed)
 Daily Progress Note   Date: 11/26/2023   Patient Name: Emma Hahn  DOB: [birth year]  MRN: [id number]  Age / Sex: 73 y.o., female  Attending Physician: Eben Reyes BROCKS, MD Primary Care Physician: Rosalea Rosina SAILOR, [place] Admit Date: 11/21/2023 Length of Stay: 4 days  Reason for Follow-up: Establishing goals of care, Non pain symptom management, Pain control, and Terminal Care  Past Medical History:  Diagnosis Date   Borderline diabetes    COPD (chronic obstructive pulmonary disease) (HCC)    Habitual alcohol use    Tobacco abuse     Subjective:   Subjective: Chart Reviewed. Updates received. Patient Assessed. Created space and opportunity for patient  and family to explore thoughts and feelings regarding current medical situation.  Today's Discussion: Today before meeting with the patient/family, I reviewed the chart notes including palliative from yesterday informing his resident note from yesterday, nursing notes from yesterday, cardiology notes from yesterday, TOC note from yesterday, night resident note from yesterday, IM resident note from today, cardiology note from today, PT note from today. I also reviewed vital signs, nursing flowsheets, medication administrations record, labs, and imaging. Labs reviewed include CoOx panel which shows CoOx of 63.1 on Milrinone in the setting of advanced, end-stage heart failure.  BMP shows stable creatinine 1.25 in the setting of advanced heart failure.  Normal at 16 also in the setting of profound encephalopathy unlikely hepatic etiology.  Today prior to seeing the patient I spoke with the medical team and palliative provider from yesterday.  Plan for family meeting today at 1:30 PM.  Conversation between internal medicine resident and cardiology nurse practitioner indicates patient has had continued decline despite improving labs on milrinone.  She is now profoundly encephalopathic, extremities are becoming cool, appears to be dying.   Medical team would not be surprised if she passes in the next 24 to 48 hours.    Today saw the patient at bedside, initially no family is present.  The patient is sleeping, difficult to arouse and generally only opens her eyes, no coherent speech.  [name] arrived, including [name], [name], and granddaughter they attempted to awaken the patient.  She did open her eyes and look at them but did not make meaningful conversation.  This was quite upsetting to the patient's family.  Myself, the patient's family, and internal medicine resident Dr. Kandis excused ourselves to the conference room for a goals of care/family meeting.   [name] called patient's other daughter karen to participate in the GOC conversation. We spent time talking about the details of her clinical picture including acute on chronic BiV failure, severe MR/TR (previously refused treatment, now not a candidate for valve treatment).  Her heart is progressively failing, at end-stage heart failure.  On milrinone and although labs are improving she continues to be profoundly encephalopathic.  She is not a candidate for advanced therapies or home inotropes.  Overall prognosis is poor and medical team recommends transition to comfort care.  Family understands the patient is very sick.  They are a bit surprised by the prognosis that she likely only has days remaining, even with maximal medical therapy available.  They are understandably emotional.  We took time to talk about options, given that the patient is likely going to pass away regardless of care choices made.  We talked about full and aggressive care where we would continue milrinone, labs, medications.  However, this would come at some sort of cost of discomfort and limit  our ability to treat her symptoms given the need to focus on vital signs and other measures.  We also discussed the possibility of transition to comfort care. I explained comfort care as  care where the patient would no longer receive aggressive medical interventions such as continuous vital signs, lab work, radiology testing, or medications not focused on comfort, peace, and dignity. This includes stopping antibiotics and weaning oxygen to room air, as these are generally not accepted as providing comfort but only prolonging the dying process artificially. All care would focus on how the patient is looking and feeling. This would include management of any symptoms that may cause discomfort, pain, shortness of breath/air hunger, increased work of breathing, cough, nausea, agitation/restlessness, anxiety, and/or secretions etc. Symptoms would be managed with medications and other non-pharmacological interventions such as spiritual support if requested, repositioning, music therapy, or therapeutic listening. Family verbalized understanding and agreement.   At the end of her conversation, all of the patient's 3 children were agreeable to transition to comfort care.  They understand we would stop medications directly related to comfort, peace, dignity.  They are hopeful the patient will survive for a few more days.  Siblings all live in [place] , [name] needs to return to [place]  today but is going to try to come back Thursday or Friday.  They do understand that she may pass at any time.  I asked that nursing notify them if she appears to be in significantly all attempts to get down to be with her.  I provided emotional and general support through therapeutic listening, empathy, sharing of stories, and other techniques. I answered all questions and addressed all concerns to the best of my ability.  After meeting with the patient's family I debriefed with medical team and nursing teams as well as plan moving forward.  I shared a palliative medicine will continue to follow for symptom management needs.  Anticipated hospital death.  Review of Systems  Unable to perform ROS   Objective:    Primary Diagnoses: Present on Admission:  Acute systolic (congestive) heart failure (HCC)  Alcohol use disorder   Vital Signs:  BP (!) 102/59 (BP Location: Left Arm)   Pulse 100   Temp 97.6 F (36.4 C) (Oral)   Resp 15   Ht 5' 4 (1.626 m)   Wt 61.2 kg   SpO2 92%   BMI 23.16 kg/m   Physical Exam Vitals and nursing note reviewed.  Constitutional:      General: She is sleeping. She is not in acute distress.    Appearance: She is ill-appearing and toxic-appearing.     Comments: Lethargic, difficult to arouse  HENT:     Head: Normocephalic and atraumatic.  Cardiovascular:     Rate and Rhythm: Normal rate.  Pulmonary:     Effort: Pulmonary effort is normal. No respiratory distress.  Abdominal:     General: Abdomen is flat.  Neurological:     Comments: Minimally responsive, no coherent conversation     Palliative Assessment/Data: 10-20%   Advanced Care Planning:   Existing Vynca/ACP Documentation: None  Primary Decision Maker: NEXT OF KIN  Pertinent diagnosis: Acute on chronic end-stage heart failure, polysubstance abuse, COPD, CKD 3B, hepatitis C (declined treatment)  The patient and/or family consented to a voluntary Advance Care Planning Conversation in person ([name], [name], granddaughter) and over the phone (daughter Hahn). Individuals present for the conversation: Emma Hahn; Emma Hahn; granddaughter; Damien Lease, DO; Camellia Kays, MD  Summary of the conversation:  Discussed details about the severe clinical situation, prognosis, current treatments, CODE STATUS, options moving forward from full aggressive care through comfort focused approach and multiple points between.  Outcome of the conversations and/or documents completed: DNR-comfort, transition to comfort care  I spent 20 minutes providing separately identifiable ACP services with the patient and/or surrogate decision maker in a voluntary, in-person conversation discussing the patient's  wishes and goals as detailed in the above note.  Assessment & Plan:   HPI/Patient Profile:  Emberlie Gotcher is a 73 y.o. female with PMH of polysubtance use disorder, chronic combined systolic and diastolic heart failure, COPD, chronic kidney disease stage 3b, substance use disorder, recurrent hospitalizations. Palliative care has been requested to support goals of care conversations.   SUMMARY OF RECOMMENDATIONS   Change to DNR-comfort Transition to comfort care See symptom management orders below Ongoing supportive patient and family Palliative medicine will continue to follow for symptom management  Symptom Management:  Tylenol  650 mg PR every 6 hours as needed mild pain (1-3), fever Biotene solution 15 mL topical as needed dry mouth Artificial tears 1 drop OU 4 times daily as needed dry eyes Robinul 0.2 mg IV every 4 hours as needed excessive secretions Haldol 0.5 mg IV every 4 hours as needed agitation or delirium Ativan  1 mg IV every 4 hours as needed anxiety Morphine  1 to 4 mg IV every 15 minutes as needed severe pain (7/10), signs/symptoms of distress Zofran  4 mg IV every 6 hours as needed nausea  Code Status: DNR - Comfort  Prognosis: Hours - Days  Discharge Planning: Anticipated Hospital Death  Discussed with: Patient's family, medical team, nursing team  Thank you for allowing us  to participate in the care of Reizel Calzada PMT will continue to support holistically.  Billing based on MDM: High  Problems Addressed: One acute or chronic illness or injury that poses a threat to life or bodily function  Risks: Parenteral controlled substances and Decision not to resuscitate or to de-escalate care because of poor prognosis  Detailed review of medical records (labs, imaging, vital signs), medically appropriate exam, discussed with treatment team, counseling and education to patient, family, & staff, documenting clinical information, medication management,  coordination of care  Camellia Kays, NP Palliative Medicine Team  Team Phone # [phone] (Nights/Weekends)  09/13/2020, 8:17 AM

## 2023-11-26 NOTE — Progress Notes (Addendum)
 Advanced Heart Failure Rounding Note  Cardiologist: Stanly DELENA Leavens, MD  HF Cardiologist: Dr. Cherrie  Chief Complaint: CHF  Subjective:    Co-ox 63% on 0.375 milrinone  CVP 3.  Opens her eyes and mumbles in response to my voice but otherwise does not engage.   Objective:   Weight Range: 61.2 kg Body mass index is 23.16 kg/m.   Vital Signs:   Temp:  [97.5 F (36.4 C)-98.5 F (36.9 C)] 97.7 F (36.5 C) (11/11 0400) Pulse Rate:  [102-112] 110 (11/11 0700) Resp:  [16-21] 20 (11/11 0400) BP: (113-143)/(49-89) 119/72 (11/11 0700) SpO2:  [94 %-100 %] 99 % (11/11 0400) Weight:  [61.2 kg] 61.2 kg (11/11 0546) Last BM Date : 11/21/23  Weight change: Filed Weights   11/24/23 0400 11/25/23 0411 11/26/23 0546  Weight: 62.3 kg 61.2 kg 61.2 kg    Intake/Output:   Intake/Output Summary (Last 24 hours) at 11/26/2023 0825 Last data filed at 11/26/2023 0646 Gross per 24 hour  Intake 603.3 ml  Output 1100 ml  Net -496.7 ml      Physical Exam    General:  Chronically ill appearing Cor: No JVD. Regular rate & rhythm, tachy. 3/6 holosystolic murmur. Lungs: breathing nonlabored Extremities: Trace edema, legs are lukewarm Neuro: Able to arouse but appears encephalopathic. Unable to comprehend speech.   Telemetry   ST 110s, ~ 5 PVCs/min  Labs    CBC Recent Labs    11/26/23 0425  WBC 8.7  HGB 10.3*  HCT 31.7*  MCV 68.5*  PLT 190   Basic Metabolic Panel Recent Labs    88/89/74 0336 11/26/23 0425  NA 136 139  K 3.8 3.2*  CL 102 103  CO2 24 23  GLUCOSE 167* 113*  BUN 24* 18  CREATININE 1.34* 1.25*  CALCIUM  8.8* 8.4*  MG 1.7 1.6*   Liver Function Tests Recent Labs    11/23/23 2129  AST 35  ALT 19  ALKPHOS 52  BILITOT 2.4*  PROT 6.9  ALBUMIN 2.8*   No results for input(s): LIPASE, AMYLASE in the last 72 hours. Cardiac Enzymes No results for input(s): CKTOTAL, CKMB, CKMBINDEX, TROPONINI in the last 72  hours.  BNP: BNP (last 3 results) Recent Labs    03/04/23 0848 11/21/23 0815  BNP 1,700.2* 2,858.8*    ProBNP (last 3 results) No results for input(s): PROBNP in the last 8760 hours.   D-Dimer No results for input(s): DDIMER in the last 72 hours. Hemoglobin A1C No results for input(s): HGBA1C in the last 72 hours. Fasting Lipid Panel No results for input(s): CHOL, HDL, LDLCALC, TRIG, CHOLHDL, LDLDIRECT in the last 72 hours. Thyroid Function Tests No results for input(s): TSH, T4TOTAL, T3FREE, THYROIDAB in the last 72 hours.  Invalid input(s): FREET3  Other results:   Imaging    No results found.   Medications:     Scheduled Medications:  aspirin  EC  81 mg Oral Daily   atorvastatin   80 mg Oral Daily   chlordiazePOXIDE  25 mg Oral TID   Followed by   chlordiazePOXIDE  25 mg Oral BH-qamhs   Followed by   NOREEN ON 11/27/2023] chlordiazePOXIDE  25 mg Oral Daily   Chlorhexidine Gluconate Cloth  6 each Topical Daily   fluticasone  furoate-vilanterol  1 puff Inhalation Daily   folic acid   1 mg Oral Daily   furosemide   80 mg Intravenous BID   insulin aspart  0-5 Units Subcutaneous QHS   insulin aspart  0-9 Units  Subcutaneous TID WC   multivitamin with minerals  1 tablet Oral Daily   nicotine  7 mg Transdermal Daily   potassium chloride   40 mEq Oral BID   rivaroxaban  10 mg Oral Daily   sacubitril -valsartan   1 tablet Oral BID   sodium chloride  flush  10-40 mL Intracatheter Q12H   spironolactone   25 mg Oral Daily    Infusions:  magnesium  sulfate bolus IVPB 4 g (11/26/23 0816)   milrinone 0.375 mcg/kg/min (11/25/23 2131)   thiamine  (VITAMIN B1) injection 500 mg (11/26/23 0611)    PRN Medications: acetaminophen  **OR** acetaminophen , albuterol , chlordiazePOXIDE, hydrOXYzine , loperamide, senna-docusate, sodium chloride  flush   Assessment/Plan   1. 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  - Cath 11/21 no CAD - Multiple admissions this year in setting of noncompliance and ongoing substance abuse - Echo this admit: EF 20-25%, D shaped LV, RV mildly reduced, severe MR, severe TR, dilated IVC - Lactic acid up to 3.7, now cleared with inotropic support - Initial CO-OX 26%. Co-ox 63% on 0.375 milrinone. She has diuresed and GDMT started. Will start milrinone wean and check Co-ox early afternoon. - CVP 3. Stop diuretics. Aggressively supp K and mag. - Continue Entresto  49/51 mg BID - Continue spiro 25 mg daily - No beta blocker with low-output - Start digoxin 0.0625 mg daily - Likely end-stage. She is not a candidate for advanced therapies given social situation and noncompliance. Would not be a candidate for home inotrope either with active substance abuse/social situation. Family meeting scheduled with Palliative Care this afternoon. Overall poor prognosis.   2. MR/TR - Severe MR and TR on echo. Likely functional - Declined mitral valve intervention in the past - No longer a candidate for procedures   3. ETOH Abuse - Longstanding issue - CIWA protocol   4 . Tobacco Abuse  - Ongoing. Cessation recommended   5. Crack/cocaine abuse - UDS + on admit   6. HCV - Untreated  7. Encephalopathy - Persists despite resolution of cardiogenic shock - Check ammonia level  Length of Stay: 4  FINCH, LINDSAY N, PA-C  11/26/2023, 8:25 AM  Advanced Heart Failure Team Pager [phone] (M-F; 7a - 5p)  Please contact [facility] for night-coverage after hours (5p -7a ) and weekends on [email or web address]   Patient seen with PA, I formulated the plan and agree with the above note.   Co-ox improved to 63% on milrinone 0.375.  Creatinine stable 1.25.  CVP < 5 now.    She is sleeping, will wake up and mumble.   General: NAD, frail Neck: No JVD, no thyromegaly or thyroid nodule.  Lungs: Clear to auscultation bilaterally with normal  respiratory effort. CV: Nondisplaced PMI.  Heart regular S1/S2, no S3/S4,2/6 HSM apex.  No peripheral edema.    Abdomen: Soft, nontender, no hepatosplenomegaly, no distention.  Skin: Intact without lesions or rashes.  Neurologic: Lethargic, follows some commands.  Extremities: No clubbing or cyanosis.  HEENT: Normal.   From a cardiac standpoint, she has stabilized.  Good co-ox on milrinone and taking po meds.  She has been well-diuresed at this point.  - Decrease milrinone to 0.25.  She is not a candidate for home inotrope so will work on gradually weaning off.  - Add digoxin 0.0625.  - Continue Entresto  49/51 bid and spironolactone  25 daily.  - Stop diuretic today with CVP < 5.   She remains encephalopathic, ?ETOH  related, ?hepatic encephalopathy.  This does not appear to be due to low output HF.   - NH3 level today.   End stage HF. Not candidate for home inotrope or advanced therapies due to active substance abuse, noncompliance, social situation.  Ultimately, hospice care would be appropriate.   Ezra Shuck 11/26/2023 10:17 AM

## 2023-11-26 NOTE — Plan of Care (Signed)

## 2023-11-27 ENCOUNTER — Encounter (HOSPITAL_COMMUNITY): Payer: Self-pay | Admitting: Infectious Diseases

## 2023-11-27 DIAGNOSIS — Z66 Do not resuscitate: Secondary | ICD-10-CM | POA: Diagnosis not present

## 2023-11-27 DIAGNOSIS — I509 Heart failure, unspecified: Secondary | ICD-10-CM | POA: Diagnosis not present

## 2023-11-27 DIAGNOSIS — I5021 Acute systolic (congestive) heart failure: Secondary | ICD-10-CM | POA: Diagnosis not present

## 2023-11-27 DIAGNOSIS — Z515 Encounter for palliative care: Secondary | ICD-10-CM | POA: Diagnosis not present

## 2023-11-27 NOTE — Plan of Care (Signed)

## 2023-11-27 NOTE — Progress Notes (Signed)
 Daily Progress Note   Date: 11/27/2023   Patient Name: Emma Hahn  DOB: [birth year]  MRN: [id number]  Age / Sex: 73 y.o., female  Attending Physician: Eben Reyes BROCKS, MD Primary Care Physician: Rosalea Rosina SAILOR, [place] Admit Date: 11/21/2023 Length of Stay: 5 days  Reason for Follow-up: Non pain symptom management, Pain control, Psychosocial/spiritual support, and Terminal Care  Past Medical History:  Diagnosis Date   Borderline diabetes    COPD (chronic obstructive pulmonary disease) (HCC)    Habitual alcohol use    Tobacco abuse     Subjective:   Subjective: Chart Reviewed. Updates received. Patient Assessed. Created space and opportunity for patient  and family to explore thoughts and feelings regarding current medical situation.  Today's Discussion: Today before meeting with the patient/family, I reviewed the chart notes including nursing note from yesterday, cardiology note from today, TOC note from today, internal medicine note from today. I also reviewed vital signs, nursing flowsheets, and medication administrations record. No labs due to comfort care status.  Vital signs today include temperature 97.1, heart rate 92, respiratory rate 37 (before medications), blood pressure 127/76, saturating 97% on room air.  Comfort medications administered include 1 mg of IV Ativan  at approximately 4:00 AM, another dose of IV Ativan  at 8:33 AM along with a dose of morphine  also at 8:33 AM.  Vitals this morning showed that she was quite tachypneic at 37 but after a dose of morphine  and Ativan  she now appears to be breathing comfortably.  Will continue parenteral controlled substances including benzodiazepines and opioids due to need thus far and anticipated future need.  Today saw the patient at bedside, no family is present.  He is accompanied by Dr. Toma with the internal medicine teaching service.  Today the patient appears comfortable.  No obvious signs of distress such as  grimacing, groaning, furrowing of the brow.  Her respirations are unlabored.  However, her respiratory pattern is a bit irregularly primarily in depth and is having brief apneic pauses of 2 to 3 seconds.  Overall appears to be having continued decline but comfortable.  After some the patient I reach out to her [name] who conferenced in her daughter Darice.  I updated them on the patient's status, continued opinion that she likely has 24 to 48 hours though could be as much as a week remaining.  Patient's family is planning to come back to [facility] [place]  tomorrow.  Requesting a letter of incapacity to be placed on the chart.  I will plan to meet with them again tomorrow at 10 AM.  I provided emotional and general support through therapeutic listening, empathy, sharing of stories, and other techniques. I answered all questions and addressed all concerns to the best of my ability.  Review of Systems  Unable to perform ROS   Objective:   Primary Diagnoses: Present on Admission:  Acute systolic (congestive) heart failure (HCC)  Alcohol use disorder   Vital Signs:  BP 127/76 (BP Location: Left Arm)   Pulse 92   Temp (!) 97.1 F (36.2 C) (Axillary)   Resp (!) 37   Ht 5' 4 (1.626 m)   Wt 61.2 kg   SpO2 97%   BMI 23.16 kg/m   Physical Exam Vitals and nursing note reviewed.  Constitutional:      General: She is sleeping. She is not in acute distress. HENT:     Head: Normocephalic and atraumatic.  Pulmonary:     Effort: Pulmonary effort is  normal. No respiratory distress.     Comments: Respiratory pattern and depth irregular, brief apneic pauses Abdominal:     General: Abdomen is flat.  Skin:    General: Skin is warm and dry.     Palliative Assessment/Data: 10%   Existing Vynca/ACP Documentation: None  Assessment & Plan:   HPI/Patient Profile:  Emma Hahn is a 73 y.o. female with PMH of polysubtance use disorder, chronic combined systolic and  diastolic heart failure, COPD, chronic kidney disease stage 3b, substance use disorder, recurrent hospitalizations. Palliative care has been requested to support goals of care conversations.   SUMMARY OF RECOMMENDATIONS   DNR-comfort Continued comfort care See symptom management orders below Ongoing support of patient and family Palliative medicine will continue to follow for symptom management  Symptom Management:  Tylenol  650 mg PR every 6 hours as needed mild pain (1-3), fever Biotene solution 15 mL topical as needed dry mouth Artificial tears 1 drop OU 4 times daily as needed dry eyes Robinul 0.2 mg IV every 4 hours as needed excessive secretions Haldol 0.5 mg IV every 4 hours as needed agitation or delirium Ativan  1 mg IV every 4 hours as needed anxiety Morphine  1 to 4 mg IV every 15 minutes as needed severe pain (7/10), signs/symptoms of distress Zofran  4 mg IV every 6 hours as needed nausea  Code Status: DNR - Comfort  Prognosis: Hours - Days  Discharge Planning: Anticipated Hospital Death  Discussed with: Patient, family, medical team, nursing team  Thank you for allowing us  to participate in the care of Emma Hahn PMT will continue to support holistically.  Billing based on MDM: High  Problems Addressed: One acute or chronic illness or injury that poses a threat to life or bodily function  Risks: Parenteral controlled substances  Detailed review of medical records (labs, imaging, vital signs), medically appropriate exam, discussed with treatment team, counseling and education to patient, family, & staff, documenting clinical information, medication management, coordination of care  Camellia Kays, NP Palliative Medicine Team  Team Phone # [phone] (Nights/Weekends)  09/13/2020, 8:17 AM

## 2023-11-27 NOTE — Progress Notes (Signed)
   Subjective: Emma Hahn is a 73 y.o. female with PMH of polysubtance use disorder, chronic combined systolic and diastolic heart failure, COPD, chronic kidney disease stage 3b, substance use disorder, recurrent hospitalizations, who was recently discharged from [facility] for exacerbation of her chronic HF. Found in [place] confused, brought to ED and admitted for acute on chronic HFrEF. Hospitalization has been complicated by development of cardiogenic shock and encephalopathy and was then transitioned to comfort care.   Today, patient was laying in bed.    Objective:  Vital signs in last 24 hours: Vitals:   11/26/23 1200 11/26/23 1920 11/27/23 0445 11/27/23 0800  BP: (!) 102/59 117/60 122/62 127/76  Pulse: 100 (!) 101 95 92  Resp: 15 18 18  (!) 37  Temp: 97.6 F (36.4 C) 98.3 F (36.8 C) (!) 97.5 F (36.4 C) (!) 97.1 F (36.2 C)  TempSrc: Oral Oral Axillary Axillary  SpO2: 92% 100% 100% 97%  Weight:      Height:       Physical Exam: General:Laying in bed, not in acute distress at the moment, resting in bed  Neuro: Not awake, laying in bed with eyes closed  Pulmonary: Normal effort on RA    Assessment/Plan:  Principal Problem:   Acute congestive heart failure (HCC) Active Problems:   Alcohol use disorder   Cocaine use   Cocaine abuse (HCC)   Acute systolic (congestive) heart failure (HCC)   Encephalopathy   Cardiogenic shock (HCC)  Acute on chronic biventricular sytolic heart failure Nonischemic cardiomyopathy per [facility] in 2021 Severe mitral regurgitation )02/2023 Lactic Acidosis Encephalopathy Goals of care conversation with family this afternoon: family elected to transition to comfort care. Anticipate in hospital death at this time.  Plan: -Comfort care orders placed with PMT -Appreciate PMT's   DM Polysubstance use disorder CIWA with ativan  Cocaine Use  COPD  HTN HLD CAD Hx Hepatitis C, untreated Patient transitioned to  comfort care. See above   Resolved Problems:  __________________________________  Code Status: Comfort care  VTE Prophylaxis:Comfort care   Kandis Perkins, DO 11/27/2023, 10:50 AM Please contact the on call pager at: [phone]

## 2023-11-27 NOTE — TOC Progression Note (Signed)
 Transition of Care [facility]) - Progression Note    Patient Details  Name: Emma Hahn MRN: [id number] Date of Birth: 1950-09-29  [facility]) CM/SW Contact  Luise JAYSON Pan, [place] Phone Number: 11/27/2023, 8:34 AM  Clinical Narrative:   Palliative following. CSW will continue to monitor and follow.   Expected Discharge Plan:  (TBD) Barriers to Discharge: Continued Medical Work up               Expected Discharge Plan and Services In-house Referral: Clinical Social Work Discharge Planning Services: CM Consult   Living arrangements for the past 2 months: Single Family Home                                       Social Drivers of Health (SDOH) Interventions SDOH Screenings   Food Insecurity: No Food Insecurity (11/21/2023)  Housing: Low Risk  (11/21/2023)  Transportation Needs: No Transportation Needs (11/21/2023)  Utilities: Not At Risk (11/21/2023)  Financial Resource Strain: Medium Risk (07/11/2021)  Social Connections: Patient Unable To Answer (11/27/2023)  Tobacco Use: High Risk (11/27/2023)    Readmission Risk Interventions    07/14/2021    2:13 PM  Readmission Risk Prevention Plan  Post Dischage Appt Complete  Medication Screening Complete  Transportation Screening Complete

## 2023-11-27 NOTE — Progress Notes (Signed)
     11/26/23 Transitioned to comfort care. On comfort meds.   Heart failure team will sign off as of 11/27/23   Zully Frane NP-C  6:57 AM

## 2023-11-28 DIAGNOSIS — I5021 Acute systolic (congestive) heart failure: Secondary | ICD-10-CM | POA: Diagnosis not present

## 2023-11-28 DIAGNOSIS — Z515 Encounter for palliative care: Secondary | ICD-10-CM | POA: Diagnosis not present

## 2023-11-28 DIAGNOSIS — Z7189 Other specified counseling: Secondary | ICD-10-CM | POA: Diagnosis not present

## 2023-11-28 DIAGNOSIS — I509 Heart failure, unspecified: Secondary | ICD-10-CM | POA: Diagnosis not present

## 2023-11-28 NOTE — Plan of Care (Signed)
  Problem: Education: Goal: Ability to describe self-care measures that may prevent or decrease complications (Diabetes Survival Skills Education) will improve Outcome: Not Applicable Goal: Individualized Educational Video(s) Outcome: Not Applicable   Problem: Coping: Goal: Ability to adjust to condition or change in health will improve Outcome: Not Applicable  PT IS COMFORT CARE

## 2023-11-28 NOTE — Progress Notes (Signed)
 Daily Progress Note   Date: 11/28/2023   Patient Name: Emma Hahn  DOB: 09-Apr-1950  MRN: [id number]  Age / Sex: 73 y.o., female  Attending Physician: Eben Reyes BROCKS, MD Primary Care Physician: Rosalea Rosina SAILOR, [place] Admit Date: 11/21/2023 Length of Stay: 6 days  Reason for Follow-up: Non pain symptom management, Pain control, Psychosocial/spiritual support, and Terminal Care  Past Medical History:  Diagnosis Date   Borderline diabetes    COPD (chronic obstructive pulmonary disease) (HCC)    Habitual alcohol use    Tobacco abuse     Subjective:   Subjective: Chart Reviewed. Updates received. Patient Assessed. Created space and opportunity for patient  and family to explore thoughts and feelings regarding current medical situation.  Today's Discussion: Today before meeting with the patient/family, I reviewed the chart notes including nursing note from yesterday, cardiology note from today, TOC note from today, internal medicine note from today. I also reviewed vital signs, nursing flowsheets, and medication administrations record. No labs due to comfort care status.  Vital signs today include temperature 97.6, heart rate 97, respiratory rate 19, blood pressure 120/68, satting 95% on room air.  Comfort medications administered include 1 mg of IV Ativan  at approximately 00:38 AM today.  Morphine  was given at 8:33 AM yesterday (2 mg) and 5:25 AM today (2 mg).  Will continue parenteral controlled substances including benzodiazepines and opioids due to need thus far and anticipated future need.  Today saw the patient at bedside, no family is present.  Today when I saw the patient her respirations were variable in depth including periods of increased work of breathing, some fidgeting/possible agitation though remains with eyes closed and nonresponsive.  Given possibility of dyspnea with end-stage heart failure, I requested nurse to provide a dose of morphine  for symptom  management.  Per daughter Emelia request I left a letter of incapacity on the chart to assist family in obtaining the patient's car which was towed from an apartment complex.  I informed the nurse and secretary where I placed it so that when family arrives, anticipated this morning, they can provide a letter to her.  Review of Systems  Unable to perform ROS   Objective:   Primary Diagnoses: Present on Admission:  Acute systolic (congestive) heart failure (HCC)  Alcohol use disorder   Vital Signs:  BP 120/68 (BP Location: Left Arm)   Pulse 97   Temp 98.6 F (37 C) (Oral)   Resp 19   Ht 5' 4 (1.626 m)   Wt 61.2 kg   SpO2 95%   BMI 23.16 kg/m   Physical Exam Vitals and nursing note reviewed.  Constitutional:      General: She is sleeping. She is not in acute distress. HENT:     Head: Normocephalic and atraumatic.  Pulmonary:     Effort: Pulmonary effort is normal. No respiratory distress.     Comments: Respiratory pattern and depth irregular, brief apneic pauses Abdominal:     General: Abdomen is flat.  Skin:    General: Skin is warm and dry.     Palliative Assessment/Data: 10%   Existing Vynca/ACP Documentation: None  Assessment & Plan:   HPI/Patient Profile:  Emma Hahn is a 73 y.o. female with PMH of polysubtance use disorder, chronic combined systolic and diastolic heart failure, COPD, chronic kidney disease stage 3b, substance use disorder, recurrent hospitalizations. Palliative care has been requested to support goals of care conversations.   SUMMARY OF RECOMMENDATIONS   DNR-comfort Continued  comfort care See symptom management orders below Ongoing support of patient and family Palliative medicine will continue to follow for symptom management  Symptom Management:  Tylenol  650 mg PR every 6 hours as needed mild pain (1-3), fever Biotene solution 15 mL topical as needed dry mouth Artificial tears 1 drop OU 4 times daily as needed dry  eyes Robinul 0.2 mg IV every 4 hours as needed excessive secretions Haldol 0.5 mg IV every 4 hours as needed agitation or delirium Ativan  1 mg IV every 4 hours as needed anxiety Morphine  1 to 4 mg IV every 15 minutes as needed severe pain (7/10), signs/symptoms of distress Zofran  4 mg IV every 6 hours as needed nausea  Code Status: DNR - Comfort  Prognosis: Hours - Days  Discharge Planning: Anticipated Hospital Death  Discussed with: Medical team, nursing team  Thank you for allowing us  to participate in the care of Emma Hahn PMT will continue to support holistically.  Billing based on MDM: High  Problems Addressed: One acute or chronic illness or injury that poses a threat to life or bodily function  Risks: Parenteral controlled substances  Detailed review of medical records (labs, imaging, vital signs), medically appropriate exam, discussed with treatment team, counseling and education to patient, family, & staff, documenting clinical information, medication management, coordination of care  Camellia Kays, NP Palliative Medicine Team  Team Phone # [phone] (Nights/Weekends)  09/13/2020, 8:17 AM

## 2023-11-28 NOTE — Progress Notes (Signed)
 HD#6 SUBJECTIVE:  Patient Summary:  Emma Hahn is a 73 y.o. female with PMH of polysubtance use disorder, chronic combined systolic and diastolic heart failure, COPD, chronic kidney disease stage 3b, substance use disorder, recurrent hospitalizations, who was recently discharged from [facility] for exacerbation of her chronic HF. Found in [place] confused, brought to ED and admitted for acute on chronic HFrEF. Hospitalization has been complicated by development of cardiogenic shock and encephalopathy and was then transitioned to comfort care.   Overnight Events: none  Interim History: Lying in bed, sleeping and appears comfortable   OBJECTIVE:  Vital Signs: Vitals:   11/26/23 1920 11/27/23 0445 11/27/23 0800 11/27/23 1930  BP: 117/60 122/62 127/76 (!) 119/97  Pulse: (!) 101 95 92 (!) 197  Resp: 18 18 (!) 37 14  Temp: 98.3 F (36.8 C) (!) 97.5 F (36.4 C) (!) 97.1 F (36.2 C) (!) 97.5 F (36.4 C)  TempSrc: Oral Axillary Axillary Axillary  SpO2: 100% 100% 97% 100%  Weight:      Height:       Supplemental O2: Room Air SpO2: 100 % O2 Flow Rate (L/min): 20 L/min  Filed Weights   11/24/23 0400 11/25/23 0411 11/26/23 0546  Weight: 62.3 kg 61.2 kg 61.2 kg     Intake/Output Summary (Last 24 hours) at 11/28/2023 0902 Last data filed at 11/27/2023 1038 Gross per 24 hour  Intake 10 ml  Output --  Net 10 ml   Net IO Since Admission: -4,223.44 mL [11/28/23 0902]  Physical Exam: Physical Exam Constitutional:      Comments: Asleep, normal appearing  HENT:     Nose: Nose normal.  Pulmonary:     Effort: Pulmonary effort is normal.  Musculoskeletal:     Right lower leg: No edema.     Left lower leg: No edema.  Skin:    General: Skin is warm.     Patient Lines/Drains/Airways Status     Active Line/Drains/Airways     Name Placement date Placement time Site Days   PICC Double Lumen 11/24/23 Right Brachial 36 cm 0 cm 11/24/23  1335  -- 4    External Urinary Catheter 11/27/23  0549  --  1            Pertinent labs and imaging:      Latest Ref Rng & Units 11/26/2023    4:25 AM 11/23/2023    6:57 AM 11/22/2023    2:42 AM  CBC  WBC 4.0 - 10.5 K/uL 8.7  9.8  7.6   Hemoglobin 12.0 - 15.0 g/dL 89.6  89.6  9.5   Hematocrit 36.0 - 46.0 % 31.7  32.4  30.4   Platelets 150 - 400 K/uL 190  196  195        Latest Ref Rng & Units 11/26/2023    4:25 AM 11/25/2023    3:36 AM 11/24/2023    7:50 AM  CMP  Glucose 70 - 99 mg/dL 886  832  863   BUN 8 - 23 mg/dL 18  24  19    Creatinine 0.44 - 1.00 mg/dL 8.74  8.65  8.74   Sodium 135 - 145 mmol/L 139  136  136   Potassium 3.5 - 5.1 mmol/L 3.2  3.8  4.4   Chloride 98 - 111 mmol/L 103  102  102   CO2 22 - 32 mmol/L 23  24  19    Calcium  8.9 - 10.3 mg/dL 8.4  8.8  9.0     No results found.  ASSESSMENT/PLAN:  Assessment: Principal Problem:   Acute congestive heart failure (HCC) Active Problems:   Alcohol use disorder   Cocaine use   Cocaine abuse (HCC)   Acute systolic (congestive) heart failure (HCC)   Encephalopathy   Cardiogenic shock (HCC)  Acute on chronic biventricular sytolic heart failure Nonischemic cardiomyopathy per LHC in 2021 Severe mitral regurgitation )02/2023 Lactic Acidosis Encephalopathy Goals of care conversation with family this afternoon: family elected to transition to comfort care. Anticipate in hospital death at this time.  Plan: -Comfort care orders placed with PMT -Appreciate PMT's    DM Polysubstance use disorder CIWA with ativan  Cocaine Use  COPD  HTN HLD CAD Hx Hepatitis C, untreated  Code: Comfort Care  Signature:  Viktoria Charmayne [facility]  9:02 AM, 11/28/2023  On Call pager [phone]

## 2023-11-29 DIAGNOSIS — I5082 Biventricular heart failure: Secondary | ICD-10-CM

## 2023-11-29 DIAGNOSIS — Z515 Encounter for palliative care: Secondary | ICD-10-CM

## 2023-11-29 DIAGNOSIS — I5043 Acute on chronic combined systolic (congestive) and diastolic (congestive) heart failure: Secondary | ICD-10-CM | POA: Diagnosis not present

## 2023-11-29 DIAGNOSIS — I509 Heart failure, unspecified: Secondary | ICD-10-CM | POA: Diagnosis not present

## 2023-11-29 DIAGNOSIS — Z7189 Other specified counseling: Secondary | ICD-10-CM | POA: Diagnosis not present

## 2023-11-29 DIAGNOSIS — I5021 Acute systolic (congestive) heart failure: Secondary | ICD-10-CM | POA: Diagnosis not present

## 2023-11-29 MED ORDER — MORPHINE SULFATE (PF) 2 MG/ML IV SOLN
1.0000 mg | INTRAVENOUS | Status: DC
Start: 2023-11-29 — End: 2023-12-09
  Administered 2023-11-29 – 2023-12-09 (×58): 1 mg via INTRAVENOUS
  Filled 2023-11-29 (×58): qty 1

## 2023-11-29 NOTE — Plan of Care (Signed)
  Problem: Skin Integrity: Goal: Risk for impaired skin integrity will decrease Outcome: Progressing   Problem: Safety: Goal: Ability to remain free from injury will improve Outcome: Progressing   

## 2023-11-29 NOTE — Progress Notes (Signed)
 HD#7 SUBJECTIVE:  Patient Summary:  Emma Hahn is a 73 y.o. female with PMH of polysubtance use disorder, chronic combined systolic and diastolic heart failure, COPD, chronic kidney disease stage 3b, substance use disorder, recurrent hospitalizations, who was recently discharged from [facility] for exacerbation of her chronic HF. Found in [place] confused, brought to ED and admitted for acute on chronic HFrEF. Hospitalization has been complicated by development of cardiogenic shock and encephalopathy and was then transitioned to comfort care.   Overnight Events: none  Interim History: More awake in bed. Does not respond to questions, eyes are open.    OBJECTIVE:  Vital Signs: Vitals:   11/27/23 1930 11/28/23 0942 11/28/23 1212 11/29/23 0526  BP: (!) 119/97 120/68 127/81 126/69  Pulse: (!) 197 97 98   Resp: 14 19 13 15   Temp: (!) 97.5 F (36.4 C) 98.6 F (37 C) (!) 97.5 F (36.4 C) 97.6 F (36.4 C)  TempSrc: Axillary Oral  Oral  SpO2: 100% 95% 99% 95%  Weight:      Height:       Supplemental O2: Room Air SpO2: 95 % O2 Flow Rate (L/min): 20 L/min  Filed Weights   11/24/23 0400 11/25/23 0411 11/26/23 0546  Weight: 62.3 kg 61.2 kg 61.2 kg     Intake/Output Summary (Last 24 hours) at 11/29/2023 0544 Last data filed at 11/28/2023 1133 Gross per 24 hour  Intake 10 ml  Output --  Net 10 ml   Net IO Since Admission: -4,213.44 mL [11/29/23 0544]  Physical Exam: Physical Exam Vitals reviewed.  Constitutional:      Appearance: She is ill-appearing. She is not toxic-appearing.     Comments: Ill appearing female curled up lying horizontally in hospital bed More awake today than yesterday  HENT:     Nose: Nose normal.  Eyes:     Comments: Eyes open today  Pulmonary:     Effort: Pulmonary effort is normal. No respiratory distress.  Skin:    Comments: BL LE xerotic and woody  Neurological:     Mental Status: She is disoriented.      Patient Lines/Drains/Airways Status     Active Line/Drains/Airways     Name Placement date Placement time Site Days   PICC Double Lumen 11/24/23 Right Brachial 36 cm 0 cm 11/24/23  1335  -- 4   External Urinary Catheter 11/27/23  0549  --  1            Pertinent labs and imaging:      Latest Ref Rng & Units 11/26/2023    4:25 AM 11/23/2023    6:57 AM 11/22/2023    2:42 AM  CBC  WBC 4.0 - 10.5 K/uL 8.7  9.8  7.6   Hemoglobin 12.0 - 15.0 g/dL 89.6  89.6  9.5   Hematocrit 36.0 - 46.0 % 31.7  32.4  30.4   Platelets 150 - 400 K/uL 190  196  195        Latest Ref Rng & Units 11/26/2023    4:25 AM 11/25/2023    3:36 AM 11/24/2023    7:50 AM  CMP  Glucose 70 - 99 mg/dL 886  832  863   BUN 8 - 23 mg/dL 18  24  19    Creatinine 0.44 - 1.00 mg/dL 8.74  8.65  8.74   Sodium 135 - 145 mmol/L 139  136  136   Potassium 3.5 - 5.1 mmol/L 3.2  3.8  4.4   Chloride 98 - 111 mmol/L 103  102  102   CO2 22 - 32 mmol/L 23  24  19    Calcium  8.9 - 10.3 mg/dL 8.4  8.8  9.0     No results found.  ASSESSMENT/PLAN:  Assessment: Principal Problem:   Acute congestive heart failure (HCC) Active Problems:   Alcohol use disorder   Cocaine use   Cocaine abuse (HCC)   Acute systolic (congestive) heart failure (HCC)   Encephalopathy   Cardiogenic shock (HCC)  Acute on chronic biventricular sytolic heart failure Nonischemic cardiomyopathy per LHC in 2021 Severe mitral regurgitation )02/2023 Lactic Acidosis Encephalopathy Goals of care conversation with family: elected to transition to comfort care. Unfortunately, it is still possible she will have an in hospital death. She has been consistent for the last couple of days, will speak with palliative care team about transferring her to hospice care.  Plan: -Comfort care orders placed with PMT -Appreciate PMT's    DM Polysubstance use disorder CIWA with ativan  Cocaine Use  COPD  HTN HLD CAD Hx Hepatitis C, untreated  Code:  Comfort Care  Signature:  Viktoria Charmayne [facility]  5:44 AM, 11/29/2023  On Call pager [phone]

## 2023-11-29 NOTE — Progress Notes (Signed)
 Daily Progress Note   Date: 11/29/2023   Patient Name: Angeliz Settlemyre  DOB: [birth year]  MRN: [id number]  Age / Sex: 73 y.o., female  Attending Physician: Eben Reyes BROCKS, MD Primary Care Physician: Rosalea Rosina SAILOR, [place] Admit Date: 11/21/2023 Length of Stay: 7 days  Reason for Follow-up: Non pain symptom management, Pain control, Psychosocial/spiritual support, and Terminal Care  Past Medical History:  Diagnosis Date   Borderline diabetes    COPD (chronic obstructive pulmonary disease) (HCC)    Habitual alcohol use    Tobacco abuse     Subjective:   Subjective: Chart Reviewed. Updates received. Patient Assessed. Created space and opportunity for patient  and family to explore thoughts and feelings regarding current medical situation.  Today's Discussion: Today before meeting with the patient/family, I reviewed the chart notes including nursing note from yesterday, internal medicine note from today. I also reviewed vital signs, nursing flowsheets, and medication administrations record. No labs due to comfort care status.  Vital signs today include temperature 97.6, heart rate 98, respiratory rate 15, blood pressure 126/69, satting 95% on room air..  Comfort medications administered include 1 mg of IV Ativan  at approximately 00:38 AM yesterday, none today.  Morphine  was given at 5:25 AM and 10:19 AM yesterday (2 mg each), none thus far this morning.  Will continue parenteral controlled substances including benzodiazepines and opioids due to need thus far and anticipated future need.  Today saw the patient at bedside, no family is present.  Today when I saw the patient her respirations were variable in depth including periods of increased work of breathing consistent with Cheyne-Stokes respirations, some fidgeting/possible agitation though remains with eyes closed and nonresponsive.  Given possibility of dyspnea with end-stage heart failure, I we will add scheduled morphine  1 mg  every 4 hours in addition to her available as needed morphine  and Ativan .  After seeing the patient I reach out to her [name].  Kristie was able to be here twice yesterday, did receive the letter of incapacity that I left for her.  I gave her an update on her mom, indicated scheduled morphine  to help prevent symptoms.  She did note that she was a bit agitated yesterday and thinks this will help.  I offered evaluation for transfer to inpatient hospice for more comfortable environment/setting for her comfort care. [name] is open to this, but would like to discuss with her brother and sister before making a final decision.  I shared that somebody can palliative medicine will follow-up tomorrow for ongoing symptom management/comfort care and to check back on decision about possible inpatient hospice.  I provided emotional and general support through therapeutic listening, empathy, sharing of stories, and other techniques. I answered all questions and addressed all concerns to the best of my ability.  After seeing the patient speak with family updated the care teams.  Review of Systems  Unable to perform ROS   Objective:   Primary Diagnoses: Present on Admission:  Acute systolic (congestive) heart failure (HCC)  Alcohol use disorder   Vital Signs:  BP 126/69   Pulse 98   Temp 97.6 F (36.4 C) (Oral)   Resp 15   Ht 5' 4 (1.626 m)   Wt 61.2 kg   SpO2 95%   BMI 23.16 kg/m   Physical Exam Vitals and nursing note reviewed.  Constitutional:      General: She is sleeping. She is not in acute distress. HENT:     Head: Normocephalic and atraumatic.  Pulmonary:     Effort: Pulmonary effort is normal. No respiratory distress.     Comments: Respiratory pattern and depth irregular, brief apneic pauses Abdominal:     General: Abdomen is flat.  Skin:    General: Skin is warm and dry.     Palliative Assessment/Data: 10%   Existing Vynca/ACP Documentation: None  Assessment &  Plan:   HPI/Patient Profile:  Ian Cavey is a 73 y.o. female with PMH of polysubtance use disorder, chronic combined systolic and diastolic heart failure, COPD, chronic kidney disease stage 3b, substance use disorder, recurrent hospitalizations. Palliative care has been requested to support goals of care conversations.   SUMMARY OF RECOMMENDATIONS   DNR-comfort Continued comfort care See symptom management orders below Ongoing support of patient and family Offered hospice evaluation for inpatient hospice, family to discuss amongst themselves Palliative medicine will follow-up tomorrow for ongoing symptom management and possibility of inpatient hospice transfer  Symptom Management:  Tylenol  650 mg PR every 6 hours as needed mild pain (1-3), fever Biotene solution 15 mL topical as needed dry mouth Artificial tears 1 drop OU 4 times daily as needed dry eyes Robinul 0.2 mg IV every 4 hours as needed excessive secretions Haldol 0.5 mg IV every 4 hours as needed agitation or delirium Ativan  1 mg IV every 4 hours as needed anxiety Morphine  1 to 4 mg IV every 15 minutes as needed severe pain (7/10), signs/symptoms of distress Zofran  4 mg IV every 6 hours as needed nausea  Code Status: DNR - Comfort  Prognosis: Hours - Days  Discharge Planning: Anticipated hospital death vs. Inpatient Hospice  Discussed with: Medical team, nursing team, patient's family  Thank you for allowing us  to participate in the care of Virdell Hoiland PMT will continue to support holistically.  Billing based on MDM: High  Problems Addressed: One acute or chronic illness or injury that poses a threat to life or bodily function  Risks: Parenteral controlled substances  Detailed review of medical records (labs, imaging, vital signs), medically appropriate exam, discussed with treatment team, counseling and education to patient, family, & staff, documenting clinical information, medication management,  coordination of care  Camellia Kays, NP Palliative Medicine Team  Team Phone # [phone] (Nights/Weekends)  09/13/2020, 8:17 AM

## 2023-11-29 NOTE — Plan of Care (Signed)
  Problem: Health Behavior/Discharge Planning: Goal: Ability to identify and utilize available resources and services will improve Outcome: Progressing   Problem: Skin Integrity: Goal: Risk for impaired skin integrity will decrease Outcome: Progressing   Problem: Clinical Measurements: Goal: Ability to maintain clinical measurements within normal limits will improve Outcome: Progressing

## 2023-11-30 DIAGNOSIS — Z515 Encounter for palliative care: Secondary | ICD-10-CM | POA: Diagnosis not present

## 2023-11-30 DIAGNOSIS — I5082 Biventricular heart failure: Secondary | ICD-10-CM | POA: Diagnosis not present

## 2023-11-30 DIAGNOSIS — I5043 Acute on chronic combined systolic (congestive) and diastolic (congestive) heart failure: Secondary | ICD-10-CM | POA: Diagnosis not present

## 2023-11-30 MED ORDER — ORAL CARE MOUTH RINSE
15.0000 mL | OROMUCOSAL | Status: DC
  Administered 2023-12-01 – 2023-12-09 (×33): 15 mL via OROMUCOSAL

## 2023-11-30 MED ORDER — ORAL CARE MOUTH RINSE
15.0000 mL | OROMUCOSAL | Status: DC | PRN
  Administered 2023-12-04: 15 mL via OROMUCOSAL

## 2023-11-30 NOTE — Progress Notes (Signed)
 Daily Progress Note   Date: 11/30/2023   Patient Name: Emma Hahn  DOB: 1950-11-10  MRN: [id number]  Age / Sex: 73 y.o., female  Attending Physician: Eben Reyes BROCKS, MD Primary Care Physician: Rosalea Rosina SAILOR, [place] Admit Date: 11/21/2023 Length of Stay: 8 days  Reason for Follow-up: Non pain symptom management, Pain control, Psychosocial/spiritual support, and Terminal Care  Subjective:   Subjective: Medical records reviewed including progress notes, labs and imaging, MAR. No PRN comfort medications required in the past 24 hours. She has received all scheduled doses of narcotics for comfort. Patient assessed at the bedside. She was a little restless, did not answer my questions. Assisted with covering her up and warming her room. She went back to sleep comfortably. No visitors were present.  Attempted to call [name] to follow up on her conversation with my colleague regarding possible hospice facility. Unable to reach today. Voicemail was left with PMT contact information. PMT will continue to support holistically.   Review of Systems  Unable to perform ROS  Objective:   Vital Signs:  BP (!) 129/91   Pulse 88   Temp 98.6 F (37 C)   Resp 14   Ht 5' 4 (1.626 m)   Wt 61.2 kg   SpO2 (!) 67%   BMI 23.16 kg/m   Physical Exam Vitals and nursing note reviewed.  Constitutional:      General: She is not in acute distress.    Appearance: She is ill-appearing.  HENT:     Head: Normocephalic and atraumatic.  Cardiovascular:     Rate and Rhythm: Normal rate.  Pulmonary:     Effort: Pulmonary effort is normal. No respiratory distress.  Abdominal:     General: Abdomen is flat.  Skin:    General: Skin is warm and dry.  Neurological:     Mental Status: She is disoriented and confused.   Palliative Assessment/Data: 10%   Existing Vynca/ACP Documentation: None  Assessment & Plan:   HPI/Patient Profile:  Emma Hahn is a 73 y.o. female with  PMH of polysubtance use disorder, chronic combined systolic and diastolic heart failure, COPD, chronic kidney disease stage 3b, substance use disorder, recurrent hospitalizations. Palliative care has been requested to support goals of care conversations.   SUMMARY OF RECOMMENDATIONS   DNR-comfort Continued comfort care including scheduled IV opioids, no adjustments required today Attempt to contact family for decision on hospice facility but unable to reach today. Voicemail provided Palliative medicine will continue follow   Code Status: DNR - Comfort  Prognosis: Hours - Days  Discharge Planning: Anticipated hospital death vs. Inpatient Hospice   Mickle Fell, [place] Palliative Medicine Team Team phone # [phone]  Thank you for allowing the Palliative Medicine Team to assist in the care of this patient. Please utilize secure chat with additional questions, if there is no response within 30 minutes please call the above phone number.  Palliative Medicine Team providers are available by phone from 7am to 7pm daily and can be reached through the team cell phone.  Should this patient require assistance outside of these hours, please call the patient's attending physician.  Portions of this note are a verbal dictation therefore any spelling and/or grammatical errors are due to the Dragon Medical One system interpretation.   Billing based on MDM: high  Problems Addressed: One acute or chronic illness or injury that poses a threat to life or bodily function  Amount and/or Complexity of Data: Category 1:Review of prior external  note(s) from each unique source, Review of the result(s) of each unique test, and Assessment requiring an independent historian(s), Category 2:Independent interpretation of a test performed by another physician/other qualified health care professional (not separately reported), and Category 3:Discussion of management or test interpretation with external  physician/other qualified health care professional/appropriate source (not separately reported)  Risks: Parenteral controlled substances

## 2023-11-30 NOTE — Progress Notes (Addendum)
 HD#8 SUBJECTIVE:  Patient Summary:  Emma Hahn is a 73 y.o. female with PMH of polysubtance use disorder, chronic combined systolic and diastolic heart failure, COPD, chronic kidney disease stage 3b, substance use disorder, recurrent hospitalizations, who was recently discharged from [facility] for exacerbation of her chronic HF. Found in [place] confused, brought to ED and admitted for acute on chronic HFrEF. Hospitalization has been complicated by development of cardiogenic shock and encephalopathy and was then transitioned to comfort care.   Overnight Events: none  Interim History: Lying in bed. Eyes open to my voice but no response to questions.    OBJECTIVE:  Vital Signs: Vitals:   11/28/23 1212 11/29/23 0526 11/29/23 1747 11/30/23 0548  BP: 127/81 126/69 (!) 136/91 (!) 129/91  Pulse: 98  (!) 109 88  Resp: 13 15 14    Temp: (!) 97.5 F (36.4 C) 97.6 F (36.4 C) 97.8 F (36.6 C) 98.6 F (37 C)  TempSrc:  Oral Oral   SpO2: 99% 95% 95% (!) 67%  Weight:      Height:       Supplemental O2: Room Air SpO2: (!) 67 % O2 Flow Rate (L/min): 20 L/min  Filed Weights   11/24/23 0400 11/25/23 0411 11/26/23 0546  Weight: 62.3 kg 61.2 kg 61.2 kg     Intake/Output Summary (Last 24 hours) at 11/30/2023 1024 Last data filed at 11/30/2023 0600 Gross per 24 hour  Intake 0 ml  Output 300 ml  Net -300 ml   Net IO Since Admission: -4,513.44 mL [11/30/23 1024]  Physical Exam: Physical Exam Vitals reviewed.  Constitutional:      Comments: Not alert, does not appear to be in pain, does not respond to questions  Pulmonary:     Effort: No respiratory distress.  Skin:    General: Skin is warm and dry.     Patient Lines/Drains/Airways Status     Active Line/Drains/Airways     Name Placement date Placement time Site Days   PICC Double Lumen 11/24/23 Right Brachial 36 cm 0 cm 11/24/23  1335  -- 4   External Urinary Catheter 11/27/23  0549  --  1             Pertinent labs and imaging:      Latest Ref Rng & Units 11/26/2023    4:25 AM 11/23/2023    6:57 AM 11/22/2023    2:42 AM  CBC  WBC 4.0 - 10.5 K/uL 8.7  9.8  7.6   Hemoglobin 12.0 - 15.0 g/dL 89.6  89.6  9.5   Hematocrit 36.0 - 46.0 % 31.7  32.4  30.4   Platelets 150 - 400 K/uL 190  196  195        Latest Ref Rng & Units 11/26/2023    4:25 AM 11/25/2023    3:36 AM 11/24/2023    7:50 AM  CMP  Glucose 70 - 99 mg/dL 886  832  863   BUN 8 - 23 mg/dL 18  24  19    Creatinine 0.44 - 1.00 mg/dL 8.74  8.65  8.74   Sodium 135 - 145 mmol/L 139  136  136   Potassium 3.5 - 5.1 mmol/L 3.2  3.8  4.4   Chloride 98 - 111 mmol/L 103  102  102   CO2 22 - 32 mmol/L 23  24  19    Calcium  8.9 - 10.3 mg/dL 8.4  8.8  9.0     No results  found.  ASSESSMENT/PLAN:  Assessment: Principal Problem:   Acute congestive heart failure (HCC) Active Problems:   Alcohol use disorder   Cocaine use   Cocaine abuse (HCC)   Acute systolic (congestive) heart failure (HCC)   Encephalopathy   Cardiogenic shock (HCC)  Acute on chronic biventricular sytolic heart failure Nonischemic cardiomyopathy per LHC in 2021 Severe mitral regurgitation )02/2023 Lactic Acidosis Encephalopathy Goals of care conversation with family: elected to transition to comfort care. Per palliative care note, family does not wish to transition patients care to hospice facility.  Plan: -Comfort care orders placed with PMT -Appreciate PMT's    DM Polysubstance use disorder CIWA with ativan  Cocaine Use  COPD  HTN HLD CAD Hx Hepatitis C, untreated  Code: Comfort Care  Signature:  Viktoria Charmayne [facility]  10:24 AM, 11/30/2023  On Call pager [phone]

## 2023-11-30 NOTE — Progress Notes (Signed)
 Daily Progress Note   Date: 11/30/2023   Patient Name: Emma Hahn  DOB: 1950-01-19  MRN: [id number]  Age / Sex: 73 y.o., female  Attending Physician: Emma Reyes BROCKS, MD Primary Care Physician: Emma Hahn Admit Date: 11/21/2023 Length of Stay: 8 days  Reason for Follow-up: Non pain symptom management, Pain control, Psychosocial/spiritual support, and Terminal Care  Subjective:   Subjective: Medical records reviewed including progress notes, labs and imaging, MAR. No PRN comfort medications required in the past 24 hours. She has received all scheduled doses of narcotics for comfort. Patient assessed at the bedside. She was a little restless, did not answer my questions. Assisted with covering her up and warming her room. She went back to sleep comfortably. No visitors were present.  Attempted to call daughter Emma Hahn to follow up on her conversation with my colleague regarding possible hospice facility. Unable to reach today. Voicemail was left with PMT contact information. PMT will continue to support holistically.  Patient's daughter then called back and provided update that her siblings did not want her transferred to hospice facility.  She is emotional about whether she is doing the right thing and not wanting her transition to drag on. She is also concerned that patient is starving and that her room might be too bright. Emotional support and therapeutic listening was provided. We discussed appropriateness of comfort feeds if patient is awake enough and indicating interest in eating. Reviewed symptom management strategies and letting the natural dying process occur, though not speeding it up. Emma Hahn verbalized her understanding and appreciation.   Review of Systems  Unable to perform ROS  Objective:   Vital Signs:  BP (!) 129/91   Pulse 88   Temp 98.6 F (37 C)   Resp 14   Ht 5' 4 (1.626 m)   Wt 61.2 kg   SpO2 (!) 67%   BMI 23.16 kg/m   Physical  Exam Vitals and nursing note reviewed.  Constitutional:      General: She is not in acute distress.    Appearance: She is ill-appearing.  HENT:     Head: Normocephalic and atraumatic.  Cardiovascular:     Rate and Rhythm: Normal rate.  Pulmonary:     Effort: Pulmonary effort is normal. No respiratory distress.  Abdominal:     General: Abdomen is flat.  Skin:    General: Skin is warm and dry.  Neurological:     Mental Status: She is disoriented and confused.   Palliative Assessment/Data: 10%   Existing Vynca/ACP Documentation: None  Assessment & Plan:   HPI/Patient Profile:  Emma Hahn is a 73 y.o. female with PMH of polysubtance use disorder, chronic combined systolic and diastolic heart failure, COPD, chronic kidney disease stage 3b, substance use disorder, recurrent hospitalizations. Palliative care has been requested to support goals of care conversations.   SUMMARY OF RECOMMENDATIONS   DNR-comfort Continued comfort care including scheduled IV opioids, no adjustments required today Ordered diet for comfort at daughter's request  Patient's daughter provided update that her siblings declined hospice facility Palliative medicine will continue follow   Code Status: DNR - Comfort  Prognosis: Hours - Days  Discharge Planning: Anticipated hospital death   Mickle Fell, PA-C Palliative Medicine Team Team phone # [phone]  Thank you for allowing the Palliative Medicine Team to assist in the care of this patient. Please utilize secure chat with additional questions, if there is no response within 30 minutes please call the above phone number.  Palliative Medicine Team providers are available by phone from 7am to 7pm daily and can be reached through the team cell phone.  Should this patient require assistance outside of these hours, please call the patient's attending physician.  Portions of this note are a verbal dictation therefore any spelling and/or  grammatical errors are due to the [employer] One system interpretation.   Billing based on MDM: high  Problems Addressed: One acute or chronic illness or injury that poses a threat to life or bodily function  Amount and/or Complexity of Data: Category 1:Review of prior external note(s) from each unique source, Review of the result(s) of each unique test, and Assessment requiring an independent historian(s), Category 2:Independent interpretation of a test performed by another physician/other qualified health care professional (not separately reported), and Category 3:Discussion of management or test interpretation with external physician/other qualified health care professional/appropriate source (not separately reported)  Risks: Parenteral controlled substances

## 2023-11-30 NOTE — Plan of Care (Signed)
  Problem: Health Behavior/Discharge Planning: Goal: Ability to identify and utilize available resources and services will improve Outcome: Progressing   Problem: Metabolic: Goal: Ability to maintain appropriate glucose levels will improve Outcome: Progressing   Problem: Nutritional: Goal: Progress toward achieving an optimal weight will improve Outcome: Progressing

## 2023-12-01 DIAGNOSIS — I5021 Acute systolic (congestive) heart failure: Secondary | ICD-10-CM | POA: Diagnosis not present

## 2023-12-01 DIAGNOSIS — Z515 Encounter for palliative care: Secondary | ICD-10-CM | POA: Diagnosis not present

## 2023-12-01 DIAGNOSIS — Z7189 Other specified counseling: Secondary | ICD-10-CM | POA: Diagnosis not present

## 2023-12-01 NOTE — Progress Notes (Signed)
   Subjective:Emma Hahn is a 73 y.o. female with PMH of polysubtance use disorder, chronic combined systolic and diastolic heart failure, COPD, chronic kidney disease stage 3b, substance use disorder, recurrent hospitalizations, who was recently discharged from [facility] for exacerbation of her chronic HF. Found in [place] confused, brought to ED and admitted for acute on chronic HFrEF. Hospitalization has been complicated by development of cardiogenic shock and encephalopathy and was then transitioned to comfort care.   Today, patient is minimally interactive in bed.   Objective:  Vital signs in last 24 hours: Vitals:   11/29/23 0526 11/29/23 1747 11/30/23 0548 11/30/23 2012  BP: 126/69 (!) 136/91 (!) 129/91 132/79  Pulse:  (!) 109 88 (!) 108  Resp: 15 14  20   Temp: 97.6 F (36.4 C) 97.8 F (36.6 C) 98.6 F (37 C) (!) 97.5 F (36.4 C)  TempSrc: Oral Oral  Axillary  SpO2: 95% 95% (!) 67% 95%  Weight:      Height:       Physical Exam: General:Laying in bed, opens eyes intermittently, minimally interactive      Latest Ref Rng & Units 11/26/2023    4:25 AM 11/23/2023    6:57 AM 11/22/2023    2:42 AM  CBC  WBC 4.0 - 10.5 K/uL 8.7  9.8  7.6   Hemoglobin 12.0 - 15.0 g/dL 89.6  89.6  9.5   Hematocrit 36.0 - 46.0 % 31.7  32.4  30.4   Platelets 150 - 400 K/uL 190  196  195         Latest Ref Rng & Units 11/26/2023    4:25 AM 11/25/2023    3:36 AM 11/24/2023    7:50 AM  BMP  Glucose 70 - 99 mg/dL 886  832  863   BUN 8 - 23 mg/dL 18  24  19    Creatinine 0.44 - 1.00 mg/dL 8.74  8.65  8.74   Sodium 135 - 145 mmol/L 139  136  136   Potassium 3.5 - 5.1 mmol/L 3.2  3.8  4.4   Chloride 98 - 111 mmol/L 103  102  102   CO2 22 - 32 mmol/L 23  24  19    Calcium  8.9 - 10.3 mg/dL 8.4  8.8  9.0      Assessment/Plan:  Principal Problem:   Acute congestive heart failure (HCC) Active Problems:   Alcohol use disorder   Cocaine use   Cocaine abuse  (HCC)   Acute systolic (congestive) heart failure (HCC)   Encephalopathy   Cardiogenic shock (HCC)  Acute on chronic biventricular sytolic heart failure Nonischemic cardiomyopathy per LHC in 2021 Severe mitral regurgitation )02/2023 Lactic Acidosis Encephalopathy Goals of care conversation with family this afternoon: family elected to transition to comfort care. Anticipate in hospital death at this time as family is not interested in transporting her to [facility].  Plan: -Comfort care orders placed with PMT -Appreciate PMT's    DM Polysubstance use disorder CIWA with ativan  Cocaine Use  COPD  HTN HLD CAD Hx Hepatitis C, untreated Patient transitioned to comfort care. See above    Resolved Problems:  __________________________________  Code Status: DNR comfort  Dispo: Anticipate in hospital death   Kandis Perkins, DO 2023/12/03, 8:20 AM Please contact the on call pager at: [phone]

## 2023-12-01 NOTE — Progress Notes (Signed)
 Daily Progress Note   Patient Name: Emma Hahn       Date: 12/01/2023 DOB: [birth year]  Age: 73 y.o. MRN#: [id number] Attending Physician: Eben Reyes BROCKS, MD Primary Care Physician: Rosalea Rosina SAILOR, [place] Admit Date: 11/21/2023  Reason for Consultation/Follow-up: Establishing goals of care  Length of Stay: 9  Current Medications: Scheduled Meds:   fluticasone  furoate-vilanterol  1 puff Inhalation Daily    morphine  injection  1 mg Intravenous Q4H   nicotine  7 mg Transdermal Daily   mouth rinse  15 mL Mouth Rinse 4 times per day   sodium chloride  flush  10-40 mL Intracatheter Q12H    Continuous Infusions:   PRN Meds: acetaminophen  **OR** acetaminophen , albuterol , antiseptic oral rinse, artificial tears, glycopyrrolate **OR** glycopyrrolate **OR** glycopyrrolate, haloperidol **OR** haloperidol **OR** haloperidol lactate, LORazepam  **OR** LORazepam  **OR** LORazepam , morphine  injection, ondansetron  **OR** ondansetron  (ZOFRAN ) IV, mouth rinse, senna-docusate, sodium chloride  flush  Physical Exam Vitals reviewed.  Constitutional:      General: She is not in acute distress.    Appearance: She is ill-appearing.  HENT:     Head: Normocephalic and atraumatic.     Mouth/Throat:     Mouth: Mucous membranes are dry.  Cardiovascular:     Rate and Rhythm: Tachycardia present.  Pulmonary:     Comments: Irregular with apneic periods Skin:    General: Skin is warm and dry.             Vital Signs: BP 132/79 (BP Location: Left Arm)   Pulse (!) 108   Temp (!) 97.5 F (36.4 C) (Axillary)   Resp 20 Comment: Irregular   Ht 5' 4 (1.626 m)   Wt 61.2 kg   SpO2 95%   BMI 23.16 kg/m  SpO2: SpO2: 95 % O2 Device: O2 Device: Room Air       Palliative  Assessment/Data:10%      Patient Active Problem List   Diagnosis Date Noted   Cardiogenic shock (HCC) 11/25/2023   Encephalopathy 11/24/2023   Cocaine abuse (HCC) 11/22/2023   Acute systolic (congestive) heart failure (HCC) 11/22/2023   Cocaine use 11/21/2023   Hypomagnesemia 03/05/2023   HTN (hypertension) 03/05/2023   Hyperlipidemia 03/05/2023   Acute pulmonary edema (HCC) 03/04/2023   COPD with acute exacerbation (HCC) 03/04/2023   AKI (acute kidney injury)  Acute congestive heart failure (HCC) 07/11/2021   MR (mitral regurgitation) 07/11/2021   Financial difficulties 12/10/2019   Alcohol use disorder 12/09/2019   Acute respiratory failure with hypoxia (HCC) 12/08/2019   Acute on chronic systolic CHF (congestive heart failure) (HCC) 12/06/2019   Tobacco abuse 12/06/2019   Prolonged QT interval 12/06/2019   Chest pain 12/06/2019   Elevated troponin 12/06/2019   Prediabetes 12/06/2019   Do not resuscitate 12/06/2019    Palliative Care Assessment & Plan   Patient Profile: Emma Hahn is a 73 y.o. female with PMH of polysubtance use disorder, chronic combined systolic and diastolic heart failure, COPD, chronic kidney disease stage 3b, substance use disorder, recurrent hospitalizations. Palliative care has been requested to support goals of care conversations.   Today's Discussion: Reviewed chart. Patient on comfort measures.  Patient continues with scheduled IV morphine  every four hours. Required one dose of IV ativan  in the last 24 hours. Patient sleeping. She appears comfortable and in no apparent distress. Her breathing is irregular with apneic periods. No family at bedside.   Spoke to patient's daughter Emma Hahn by phone. Updated her on the patient's medication requirements and irregular breathing. Emma Hahn is worried her mother may be thirsty/hungry since she has not eating in several days.  We discuss that decreased intake is part of the dying process and expected.  We discuss that most of this discomfort would come from dry mouth and we can request excellent oral care and use moist sponges to keep her mouth moist. Emma Hahn expresses understanding. Emma Hahn is worried her mother may be hanging on wanting to see a granddaughter that has not visited. She plans to have a family member Facetime the granddaughter from the bedside. Emma Hahn does not want her mom to  suffer and is hopeful her mom will pass on after seeing her granddaughter.  Emotional support and therapeutic listening provided. Encouraged family to call PMT with questions or concerns. PMT will continue to monitor.   Recommendations/Plan: DNR- comfort Anticipated hospital death PMT will continue to follow    Code Status:    Code Status Orders  (From admission, onward)           Start     Ordered   11/26/23 1435  Do not attempt resuscitation (DNR) - Comfort care  Continuous       Question Answer Comment  If patient has no pulse and is not breathing Do Not Attempt Resuscitation   In Pre-Arrest Conditions (Patient Is Breathing and Has a Pulse) Provide comfort measures. Relieve any mechanical airway obstruction. Avoid transfer unless required for comfort.   Consent: Discussion documented in EHR or advanced directives reviewed      11/26/23 1438         Extensive chart review has been completed prior to seeing the patient including vital signs, progress/consult notes, orders, medications, and available advance directive documents.  Care plan was discussed with bedside RN  Time spent: 35 minutes  Thank you for allowing the Palliative Medicine Team to assist in the care of this patient.    Stephane CHRISTELLA Palin, NP  Please contact Palliative Medicine Team phone at [phone] for questions and concerns.

## 2023-12-02 DIAGNOSIS — Z515 Encounter for palliative care: Secondary | ICD-10-CM | POA: Diagnosis not present

## 2023-12-02 DIAGNOSIS — I5082 Biventricular heart failure: Secondary | ICD-10-CM | POA: Diagnosis not present

## 2023-12-02 DIAGNOSIS — I509 Heart failure, unspecified: Secondary | ICD-10-CM | POA: Diagnosis not present

## 2023-12-02 DIAGNOSIS — I5043 Acute on chronic combined systolic (congestive) and diastolic (congestive) heart failure: Secondary | ICD-10-CM | POA: Diagnosis not present

## 2023-12-02 NOTE — Progress Notes (Signed)
   Subjective:Emma Hahn is a 73 y.o. female with PMH of polysubtance use disorder, chronic combined systolic and diastolic heart failure, COPD, chronic kidney disease stage 3b, substance use disorder, recurrent hospitalizations, who was recently discharged from [facility] for exacerbation of her chronic HF. Found in [place] confused, brought to ED and admitted for acute on chronic HFrEF. Hospitalization has been complicated by development of cardiogenic shock and encephalopathy and was then transitioned to comfort care.   Today, patient is minimally interactive in bed.   Objective:  Vital signs in last 24 hours: Vitals:   11/30/23 0548 11/30/23 2012 12/01/23 1046 12/01/23 2219  BP: (!) 129/91 132/79 115/75 (!) 134/93  Pulse: 88 (!) 108 (!) 106 (!) 114  Resp:  20 18 (!) 22  Temp: 98.6 F (37 C) (!) 97.5 F (36.4 C) 97.6 F (36.4 C) 98.4 F (36.9 C)  TempSrc:  Axillary Axillary Oral  SpO2: (!) 67% 95% 96% 97%  Weight:      Height:       Physical Exam: General: Lying in bed, opens eyes intermittently, minimally interactive      Latest Ref Rng & Units 11/26/2023    4:25 AM 11/23/2023    6:57 AM 11/22/2023    2:42 AM  CBC  WBC 4.0 - 10.5 K/uL 8.7  9.8  7.6   Hemoglobin 12.0 - 15.0 g/dL 89.6  89.6  9.5   Hematocrit 36.0 - 46.0 % 31.7  32.4  30.4   Platelets 150 - 400 K/uL 190  196  195         Latest Ref Rng & Units 11/26/2023    4:25 AM 11/25/2023    3:36 AM 11/24/2023    7:50 AM  BMP  Glucose 70 - 99 mg/dL 886  832  863   BUN 8 - 23 mg/dL 18  24  19    Creatinine 0.44 - 1.00 mg/dL 8.74  8.65  8.74   Sodium 135 - 145 mmol/L 139  136  136   Potassium 3.5 - 5.1 mmol/L 3.2  3.8  4.4   Chloride 98 - 111 mmol/L 103  102  102   CO2 22 - 32 mmol/L 23  24  19    Calcium  8.9 - 10.3 mg/dL 8.4  8.8  9.0      Assessment/Plan:  Principal Problem:   Acute congestive heart failure (HCC) Active Problems:   Alcohol use disorder   Cocaine use    Cocaine abuse (HCC)   Acute systolic (congestive) heart failure (HCC)   Encephalopathy   Cardiogenic shock (HCC)  Acute on chronic biventricular sytolic heart failure Nonischemic cardiomyopathy per LHC in 2021 Severe mitral regurgitation )02/2023 Lactic Acidosis Encephalopathy Goals of care conversation previously during this admission: family elected to transition to comfort care. Anticipate in hospital death at this time as family did not initially want to transfer to [facility]  Palliative care is following, appreciate their assistance. - Comfort care orders managed by palliative   DM Polysubstance use disorder CIWA with ativan  Cocaine Use  COPD  HTN HLD CAD Hx Hepatitis C, untreated Patient transitioned to comfort care. See above    Resolved Problems:  __________________________________  Code Status: DNR comfort  Dispo: Anticipate in hospital death   Waymond Cart, MD Internal Medicine ResidencyPGY-1 12/02/2023, 10:41 AM  Please contact the on call pager at: [phone]

## 2023-12-02 NOTE — Progress Notes (Addendum)
 Daily Progress Note   Patient Name: Emma Hahn       Date: 12/02/2023 DOB: [birth year]  Age: 73 y.o. MRN#: [id number] Attending Physician: Emma Reyes BROCKS, MD Primary Care Physician: Emma Hahn, [place] Admit Date: 11/21/2023  Reason for Consultation/Follow-up: Establishing goals of care  Length of Stay: 10  Current Medications: Scheduled Meds:   fluticasone  furoate-vilanterol  1 puff Inhalation Daily    morphine  injection  1 mg Intravenous Q4H   nicotine  7 mg Transdermal Daily   mouth rinse  15 mL Mouth Rinse 4 times per day   sodium chloride  flush  10-40 mL Intracatheter Q12H    Continuous Infusions:   PRN Meds: acetaminophen  **OR** acetaminophen , albuterol , antiseptic oral rinse, artificial tears, glycopyrrolate **OR** glycopyrrolate **OR** glycopyrrolate, haloperidol **OR** haloperidol **OR** haloperidol lactate, LORazepam  **OR** LORazepam  **OR** LORazepam , morphine  injection, ondansetron  **OR** ondansetron  (ZOFRAN ) IV, mouth rinse, senna-docusate, sodium chloride  flush  Physical Exam Vitals reviewed.  Constitutional:      General: She is not in acute distress.    Appearance: She is ill-appearing.  HENT:     Head: Normocephalic and atraumatic.     Mouth/Throat:     Mouth: Mucous membranes are dry.  Cardiovascular:     Rate and Rhythm: Tachycardia present.  Pulmonary:     Comments: Irregular with apneic periods Skin:    General: Skin is dry.             Vital Signs: BP (!) 134/93 (BP Location: Left Arm)   Pulse (!) 114   Temp 98.4 F (36.9 C) (Oral)   Resp (!) 22   Ht 5' 4 (1.626 m)   Wt 61.2 kg   SpO2 97%   BMI 23.16 kg/m  SpO2: SpO2: 97 % O2 Device: O2 Device: Room Air       Palliative Assessment/Data:10%      Patient Active Problem  List   Diagnosis Date Noted   Cardiogenic shock (HCC) 11/25/2023   Encephalopathy 11/24/2023   Cocaine abuse (HCC) 11/22/2023   Acute systolic (congestive) heart failure (HCC) 11/22/2023   Cocaine use 11/21/2023   Hypomagnesemia 03/05/2023   HTN (hypertension) 03/05/2023   Hyperlipidemia 03/05/2023   Acute pulmonary edema (HCC) 03/04/2023   COPD with acute exacerbation (HCC) 03/04/2023   AKI (acute kidney injury)    Acute  congestive heart failure (HCC) 07/11/2021   MR (mitral regurgitation) 07/11/2021   Financial difficulties 12/10/2019   Alcohol use disorder 12/09/2019   Acute respiratory failure with hypoxia (HCC) 12/08/2019   Acute on chronic systolic CHF (congestive heart failure) (HCC) 12/06/2019   Tobacco abuse 12/06/2019   Prolonged QT interval 12/06/2019   Chest pain 12/06/2019   Elevated troponin 12/06/2019   Prediabetes 12/06/2019   Do not resuscitate 12/06/2019    Palliative Care Assessment & Plan   Patient Profile: Emma Hahn is a 73 y.o. female with PMH of polysubtance use disorder, chronic combined systolic and diastolic heart failure, COPD, chronic kidney disease stage 3b, substance use disorder, recurrent hospitalizations. Palliative care has been requested to support goals of care conversations.   Today's Discussion: Reviewed chart. Patient on comfort measures.  Patient continues with scheduled IV morphine  every four hours. Required two dose of IV ativan  in the last 24 hours. Patient sleeping. She appears comfortable and in no apparent distress. Her breathing is irregular with apneic periods. No family at bedside.    11:45: Spoke to patient's daughter Emma Hahn. Emma Hahn shared her siblings are struggling with the fact the patient has not passed away and are questioning their decision for comfort focussed care. I told Emma Hahn I believed the patient was approaching her end of life. I shared my worry that with her comorbidities a shift to aggressive measures will  likely not improve her quality of life and may not extend her length of life. Emma Hahn would like to speak to the medical team. I offered to have another GOC meeting if this will help the family.  12:15: Spoke to Emma Hahn by phone.  She opened up and shared the patient was able to communicate with her granddaughter yesterday and appeared to be having a good day and that is why they are questioning if transitioning to comfort measures what the best decision. I told her that the patient looked closer to death today than the rest of the weekend. I shared her condition this morning and her condition from nursing's perspective. I shared that I thought yesterday could have been a rally. She plans to talk to with attending team.  2:10: Received update from attending team that the patient will remain full comfort measures.  Emotional support and therapeutic listening provided. Encouraged family to call PMT with questions or concerns. PMT will continue to monitor.   Recommendations/Plan: DNR- comfort Anticipated hospital death PMT will continue to follow    Code Status:    Code Status Orders  (From admission, onward)           Start     Ordered   11/26/23 1435  Do not attempt resuscitation (DNR) - Comfort care  Continuous       Question Answer Comment  If patient has no pulse and is not breathing Do Not Attempt Resuscitation   In Pre-Arrest Conditions (Patient Is Breathing and Has a Pulse) Provide comfort measures. Relieve any mechanical airway obstruction. Avoid transfer unless required for comfort.   Consent: Discussion documented in EHR or advanced directives reviewed      11/26/23 1438         Extensive chart review has been completed prior to seeing the patient including vital signs, progress/consult notes, orders, medications, and available advance directive documents.  Care plan was discussed with bedside RN and attending team  Time spent: 50 minutes  Thank you for allowing the  Palliative Medicine Team to assist in the care of this  patient.    Emma CHRISTELLA Palin, NP  Please contact Palliative Medicine Team phone at [phone] for questions and concerns.

## 2023-12-03 DIAGNOSIS — I5021 Acute systolic (congestive) heart failure: Secondary | ICD-10-CM | POA: Diagnosis not present

## 2023-12-03 DIAGNOSIS — Z66 Do not resuscitate: Secondary | ICD-10-CM | POA: Diagnosis not present

## 2023-12-03 DIAGNOSIS — Z515 Encounter for palliative care: Secondary | ICD-10-CM | POA: Diagnosis not present

## 2023-12-03 DIAGNOSIS — I5082 Biventricular heart failure: Secondary | ICD-10-CM | POA: Diagnosis not present

## 2023-12-03 DIAGNOSIS — Z7189 Other specified counseling: Secondary | ICD-10-CM | POA: Diagnosis not present

## 2023-12-03 NOTE — Progress Notes (Signed)
 Subjective:Emma Hahn is a 73 y.o. female with PMH of polysubtance use disorder, chronic combined systolic and diastolic heart failure, COPD, chronic kidney disease stage 3b, substance use disorder, recurrent hospitalizations, who was recently discharged from [facility] [place]  [facility] for exacerbation of her chronic HF. Found in [place] confused, brought to ED and admitted for acute on chronic HFrEF. Hospitalization has been complicated by development of cardiogenic shock and encephalopathy and was then transitioned to comfort care.   12/02/23: Discussed patient's prognosis and condition with [name] (sister) who agrees that patient should remain comfort care. Does not want to transfer the patient from hospital.  Today, the patient remains minimally interactive in bed, less so than yesterday.  Objective:  Vital signs in last 24 hours: Vitals:   12/01/23 1046 12/01/23 2219 12/02/23 2029 12/03/23 0509  BP: 115/75 (!) 134/93 125/77 120/85  Pulse: (!) 106 (!) 114 (!) 103 98  Resp: 18 (!) 22 (!) 22 12  Temp: 97.6 F (36.4 C) 98.4 F (36.9 C) 97.7 F (36.5 C) 97.7 F (36.5 C)  TempSrc: Axillary Oral Oral Oral  SpO2: 96% 97% 100% 97%  Weight:      Height:       Physical Exam: General: Lying in bed, opens eyes intermittently, minimally interactive      Latest Ref Rng & Units 11/26/2023    4:25 AM 11/23/2023    6:57 AM 11/22/2023    2:42 AM  CBC  WBC 4.0 - 10.5 K/uL 8.7  9.8  7.6   Hemoglobin 12.0 - 15.0 g/dL 89.6  89.6  9.5   Hematocrit 36.0 - 46.0 % 31.7  32.4  30.4   Platelets 150 - 400 K/uL 190  196  195         Latest Ref Rng & Units 11/26/2023    4:25 AM 11/25/2023    3:36 AM 11/24/2023    7:50 AM  BMP  Glucose 70 - 99 mg/dL 886  832  863   BUN 8 - 23 mg/dL 18  24  19    Creatinine 0.44 - 1.00 mg/dL 8.74  8.65  8.74   Sodium 135 - 145 mmol/L 139  136  136   Potassium 3.5 - 5.1 mmol/L 3.2  3.8  4.4   Chloride 98 - 111 mmol/L 103  102  102   CO2 22 - 32  mmol/L 23  24  19    Calcium  8.9 - 10.3 mg/dL 8.4  8.8  9.0      Assessment/Plan:  Principal Problem:   Acute congestive heart failure (HCC) Active Problems:   Alcohol use disorder   Cocaine use   Cocaine abuse (HCC)   Acute systolic (congestive) heart failure (HCC)   Encephalopathy   Cardiogenic shock (HCC)   Palliative care patient  Acute on chronic biventricular sytolic heart failure Nonischemic cardiomyopathy per LHC in 2021 Severe mitral regurgitation )02/2023 Lactic Acidosis Encephalopathy Goals of care conversation previously during this admission: family elected to transition to comfort care. Anticipate in hospital death at this time as family did not initially want to transfer to [facility].  Palliative care is following, appreciate their assistance. - Comfort care orders managed by palliative   DM Polysubstance use disorder CIWA with ativan  Cocaine Use  COPD  HTN HLD CAD Hx Hepatitis C, untreated Patient transitioned to comfort care. See above    Resolved Problems:  __________________________________  Code Status: DNR comfort  Dispo: Anticipate in hospital death   Waymond Cart, MD  Internal Medicine ResidencyPGY-1 12/03/2023, 8:38 AM  Please contact the on call pager at: [phone]

## 2023-12-03 NOTE — Progress Notes (Signed)
 Daily Progress Note   Date: 12/03/2023   Patient Name: Emma Hahn  DOB: [birth year]  MRN: [id number]  Age / Sex: 73 y.o., female  Attending Physician: Eben Reyes BROCKS, MD Primary Care Physician: Rosalea Rosina SAILOR, [place] Admit Date: 11/21/2023 Length of Stay: 11 days  Reason for Follow-up: Non pain symptom management, Pain control, Psychosocial/spiritual support, and Terminal Care  Past Medical History:  Diagnosis Date   Borderline diabetes    COPD (chronic obstructive pulmonary disease) (HCC)    Habitual alcohol use    Tobacco abuse     Subjective:   Subjective: Chart Reviewed. Updates received. Patient Assessed. Created space and opportunity for patient  and family to explore thoughts and feelings regarding current medical situation.  Today's Discussion: Today before meeting with the patient/family, I reviewed the chart notes including resident note from yesterday, palliative medicine note from yesterday, nursing note from today, resident note from today. I also reviewed vital signs, nursing flowsheets, and medication administrations record. No labs due to comfort care status.  Vital signs today include temperature 97.7, heart rate not measured, respiratory rate 12, blood pressure 120/85, satting 97% on room air.  Comfort medications administered include 1 mg of IV Ativan  at approximately 3:10 AM yesterday, none today.  Morphine  has been given as ordered 1 mg every 4 hours, no as needed doses in between in the past 24 hours.  Will continue parenteral controlled substances including benzodiazepines and opioids due to need thus far and anticipated future need. No need to increased the scheduled morphine  dose or decrease the interval at this time.  Today saw the patient at bedside, no family is present.  Today when I saw the patient her respirations were variable in depth including periods of increased work of breathing consistent with Cheyne-Stokes respirations, some  fidgeting/possible agitation though remains with eyes closed and nonresponsive.  However, she is having more prolonged periods of apnea generally from 10 to 15 seconds long. However, she does not appear to be in discomfort or distress.  After seeing the patient I reached out to her [name] I updated her on her mother's condition, allowed time and space for her to express her emotions about the situation.  She is very appreciative of all the care that her mother has received.  I shared that her mother is also comfortable, but based on apneic pauses I feel that she likely has had a worsening left.  However, she is surprised as to the past several days with similar prognostication.  She understands that it is not guaranteed.  She has asked that, moving forward, that only family be allowed to visit.  I shared that I would update the nurse and unit secretary about this.  She asked if I could notify her tomorrow after seeing her mother how she is doing.  If she looks like she is doing stable then she will try to come back tomorrow afternoon to visit.  I shared that I would be happy to do this. I provided emotional and general support through therapeutic listening, empathy, sharing of stories, and other techniques. I answered all questions and addressed all concerns to the best of my ability.  After seeing the patient speak with family updated the care teams.  The resident team has again asked about evaluation for inpatient hospice.  However, while the patient remains appropriate for inpatient hospice the family has declined transfer at this time.  They prefer her to remain in the hospital for end-of-life.  Review  of Systems  Unable to perform ROS   Objective:   Primary Diagnoses: Present on Admission:  Acute systolic (congestive) heart failure (HCC)  Alcohol use disorder   Vital Signs:  BP 120/85 (BP Location: Left Arm)   Pulse 98   Temp 97.7 F (36.5 C) (Oral)   Resp 12   Ht 5' 4 (1.626  m)   Wt 61.2 kg   SpO2 97%   BMI 23.16 kg/m   Physical Exam Vitals and nursing note reviewed.  Constitutional:      General: She is sleeping. She is not in acute distress. HENT:     Head: Normocephalic and atraumatic.  Pulmonary:     Effort: Pulmonary effort is normal. No respiratory distress.     Comments: Respiratory pattern and depth irregular, brief apneic pauses Abdominal:     General: Abdomen is flat.  Skin:    General: Skin is warm and dry.     Palliative Assessment/Data: 10%   Existing Vynca/ACP Documentation: None  Assessment & Plan:   HPI/Patient Profile:  Emma Hahn is a 73 y.o. female with PMH of polysubtance use disorder, chronic combined systolic and diastolic heart failure, COPD, chronic kidney disease stage 3b, substance use disorder, recurrent hospitalizations. Palliative care has been requested to support goals of care conversations.   SUMMARY OF RECOMMENDATIONS   DNR-comfort Continued comfort care See symptom management orders below Ongoing support of patient and family Previously offered hospice evaluation for inpatient hospice, family declines evaluation and transfer at this time preferring to remain inpatient Palliative medicine will continue to follow for ongoing symptom management  Symptom Management:  Tylenol  650 mg PR every 6 hours as needed mild pain (1-3), fever Biotene solution 15 mL topical as needed dry mouth Artificial tears 1 drop OU 4 times daily as needed dry eyes Robinul 0.2 mg IV every 4 hours as needed excessive secretions Haldol 0.5 mg IV every 4 hours as needed agitation or delirium Ativan  1 mg IV every 4 hours as needed anxiety Morphine  1 mg IV every 4 hours scheduled Morphine  1 to 4 mg IV every 15 minutes as needed severe pain (7/10), signs/symptoms of distress Zofran  4 mg IV every 6 hours as needed nausea  Code Status: DNR - Comfort  Prognosis: Hours - Days  Discharge Planning: Anticipated Hospital  Death  Discussed with: Medical team, nursing team, patient's family  Thank you for allowing us  to participate in the care of Emma Hahn PMT will continue to support holistically.  Billing based on MDM: High  Problems Addressed: One acute or chronic illness or injury that poses a threat to life or bodily function  Risks: Parenteral controlled substances  Detailed review of medical records (labs, imaging, vital signs), medically appropriate exam, discussed with treatment team, counseling and education to patient, family, & staff, documenting clinical information, medication management, coordination of care  Camellia Kays, NP Palliative Medicine Team  Team Phone # [phone] (Nights/Weekends)  09/13/2020, 8:17 AM

## 2023-12-03 NOTE — Progress Notes (Signed)
 Password set per family request.

## 2023-12-03 NOTE — Plan of Care (Signed)
  Problem: Fluid Volume: Goal: Ability to maintain a balanced intake and output will improve Outcome: Progressing   Problem: Health Behavior/Discharge Planning: Goal: Ability to identify and utilize available resources and services will improve Outcome: Progressing Goal: Ability to manage health-related needs will improve Outcome: Progressing   Problem: Metabolic: Goal: Ability to maintain appropriate glucose levels will improve Outcome: Progressing   Problem: Nutritional: Goal: Maintenance of adequate nutrition will improve Outcome: Progressing Goal: Progress toward achieving an optimal weight will improve Outcome: Progressing   Problem: Skin Integrity: Goal: Risk for impaired skin integrity will decrease Outcome: Progressing   Problem: Tissue Perfusion: Goal: Adequacy of tissue perfusion will improve Outcome: Progressing   Problem: Education: Goal: Knowledge of General Education information will improve Description: Including pain rating scale, medication(s)/side effects and non-pharmacologic comfort measures Outcome: Progressing   Problem: Health Behavior/Discharge Planning: Goal: Ability to manage health-related needs will improve Outcome: Progressing   Problem: Clinical Measurements: Goal: Ability to maintain clinical measurements within normal limits will improve Outcome: Progressing Goal: Will remain free from infection Outcome: Progressing Goal: Diagnostic test results will improve Outcome: Progressing Goal: Respiratory complications will improve Outcome: Progressing Goal: Cardiovascular complication will be avoided Outcome: Progressing   Problem: Activity: Goal: Risk for activity intolerance will decrease Outcome: Progressing   Problem: Nutrition: Goal: Adequate nutrition will be maintained Outcome: Progressing   Problem: Coping: Goal: Level of anxiety will decrease Outcome: Progressing   Problem: Elimination: Goal: Will not experience  complications related to bowel motility Outcome: Progressing Goal: Will not experience complications related to urinary retention Outcome: Progressing   Problem: Pain Managment: Goal: General experience of comfort will improve and/or be controlled Outcome: Progressing   Problem: Safety: Goal: Ability to remain free from injury will improve Outcome: Progressing   Problem: Skin Integrity: Goal: Risk for impaired skin integrity will decrease Outcome: Progressing   Problem: Education: Goal: Knowledge of the prescribed therapeutic regimen will improve Outcome: Progressing   Problem: Coping: Goal: Ability to identify and develop effective coping behavior will improve Outcome: Progressing   Problem: Clinical Measurements: Goal: Quality of life will improve Outcome: Progressing   Problem: Respiratory: Goal: Verbalizations of increased ease of respirations will increase Outcome: Progressing   Problem: Role Relationship: Goal: Family's ability to cope with current situation will improve Outcome: Progressing Goal: Ability to verbalize concerns, feelings, and thoughts to partner or family member will improve Outcome: Progressing   Problem: Pain Management: Goal: Satisfaction with pain management regimen will improve Outcome: Progressing

## 2023-12-03 NOTE — Care Management Important Message (Signed)
 Important Message  Patient Details  Name: Emma Hahn MRN: [id number] Date of Birth: 1950-07-31   Important Message Given:  Yes - Medicare IM     Jennie Laneta Dragon 12/03/2023, 1:20 PM

## 2023-12-04 DIAGNOSIS — Z515 Encounter for palliative care: Secondary | ICD-10-CM | POA: Diagnosis not present

## 2023-12-04 DIAGNOSIS — Z66 Do not resuscitate: Secondary | ICD-10-CM | POA: Diagnosis not present

## 2023-12-04 DIAGNOSIS — I5082 Biventricular heart failure: Secondary | ICD-10-CM | POA: Diagnosis not present

## 2023-12-04 DIAGNOSIS — I5021 Acute systolic (congestive) heart failure: Secondary | ICD-10-CM | POA: Diagnosis not present

## 2023-12-04 DIAGNOSIS — Z7189 Other specified counseling: Secondary | ICD-10-CM | POA: Diagnosis not present

## 2023-12-04 NOTE — Plan of Care (Signed)
  Problem: Fluid Volume: Goal: Ability to maintain a balanced intake and output will improve Outcome: Progressing   Problem: Health Behavior/Discharge Planning: Goal: Ability to identify and utilize available resources and services will improve Outcome: Progressing Goal: Ability to manage health-related needs will improve Outcome: Progressing   Problem: Metabolic: Goal: Ability to maintain appropriate glucose levels will improve Outcome: Progressing   Problem: Nutritional: Goal: Maintenance of adequate nutrition will improve Outcome: Progressing Goal: Progress toward achieving an optimal weight will improve Outcome: Progressing   Problem: Skin Integrity: Goal: Risk for impaired skin integrity will decrease Outcome: Progressing   Problem: Tissue Perfusion: Goal: Adequacy of tissue perfusion will improve Outcome: Progressing   Problem: Education: Goal: Knowledge of General Education information will improve Description: Including pain rating scale, medication(s)/side effects and non-pharmacologic comfort measures Outcome: Progressing   Problem: Health Behavior/Discharge Planning: Goal: Ability to manage health-related needs will improve Outcome: Progressing   Problem: Clinical Measurements: Goal: Ability to maintain clinical measurements within normal limits will improve Outcome: Progressing Goal: Will remain free from infection Outcome: Progressing Goal: Diagnostic test results will improve Outcome: Progressing Goal: Respiratory complications will improve Outcome: Progressing Goal: Cardiovascular complication will be avoided Outcome: Progressing   Problem: Activity: Goal: Risk for activity intolerance will decrease Outcome: Progressing   Problem: Nutrition: Goal: Adequate nutrition will be maintained Outcome: Progressing   Problem: Coping: Goal: Level of anxiety will decrease Outcome: Progressing   Problem: Elimination: Goal: Will not experience  complications related to bowel motility Outcome: Progressing Goal: Will not experience complications related to urinary retention Outcome: Progressing   Problem: Pain Managment: Goal: General experience of comfort will improve and/or be controlled Outcome: Progressing   Problem: Safety: Goal: Ability to remain free from injury will improve Outcome: Progressing   Problem: Skin Integrity: Goal: Risk for impaired skin integrity will decrease Outcome: Progressing   Problem: Education: Goal: Knowledge of the prescribed therapeutic regimen will improve Outcome: Progressing   Problem: Coping: Goal: Ability to identify and develop effective coping behavior will improve Outcome: Progressing   Problem: Clinical Measurements: Goal: Quality of life will improve Outcome: Progressing   Problem: Respiratory: Goal: Verbalizations of increased ease of respirations will increase Outcome: Progressing   Problem: Role Relationship: Goal: Family's ability to cope with current situation will improve Outcome: Progressing Goal: Ability to verbalize concerns, feelings, and thoughts to partner or family member will improve Outcome: Progressing   Problem: Pain Management: Goal: Satisfaction with pain management regimen will improve Outcome: Progressing

## 2023-12-04 NOTE — Progress Notes (Signed)
 Subjective:Emma Hahn is a 73 y.o. female with PMH of polysubtance use disorder, chronic combined systolic and diastolic heart failure, COPD, chronic kidney disease stage 3b, substance use disorder, recurrent hospitalizations, who was recently discharged from [facility] [place]  [facility] for exacerbation of her chronic HF. Found in [place] confused, brought to ED and admitted for acute on chronic HFrEF. Hospitalization has been complicated by development of cardiogenic shock and encephalopathy and was then transitioned to comfort care.   12/02/23: Discussed patient's prognosis and condition with [name] (sister) who agrees that patient should remain comfort care. Does not want to transfer the patient from hospital.  12/03/23: Per Palliative note, patient's family reiterated their preference to not transfer the patient to hospice facility.  Today, the patient remains minimally interactive in bed, did not respond to verbal cues.  Objective:  Vital signs in last 24 hours: Vitals:   12/02/23 2029 12/03/23 0509 12/04/23 0537 12/04/23 0548  BP: 125/77 120/85 118/84 118/84  Pulse: (!) 103 98 (!) 101 (!) 101  Resp: (!) 22 12 16 16   Temp: 97.7 F (36.5 C) 97.7 F (36.5 C) 97.6 F (36.4 C) 97.6 F (36.4 C)  TempSrc: Oral Oral Axillary Oral  SpO2: 100% 97%  100%  Weight:      Height:       Physical Exam: General: Lying in bed, opens eyes intermittently, minimally interactive      Latest Ref Rng & Units 11/26/2023    4:25 AM 11/23/2023    6:57 AM 11/22/2023    2:42 AM  CBC  WBC 4.0 - 10.5 K/uL 8.7  9.8  7.6   Hemoglobin 12.0 - 15.0 g/dL 89.6  89.6  9.5   Hematocrit 36.0 - 46.0 % 31.7  32.4  30.4   Platelets 150 - 400 K/uL 190  196  195         Latest Ref Rng & Units 11/26/2023    4:25 AM 11/25/2023    3:36 AM 11/24/2023    7:50 AM  BMP  Glucose 70 - 99 mg/dL 886  832  863   BUN 8 - 23 mg/dL 18  24  19    Creatinine 0.44 - 1.00 mg/dL 8.74  8.65  8.74   Sodium 135 -  145 mmol/L 139  136  136   Potassium 3.5 - 5.1 mmol/L 3.2  3.8  4.4   Chloride 98 - 111 mmol/L 103  102  102   CO2 22 - 32 mmol/L 23  24  19    Calcium  8.9 - 10.3 mg/dL 8.4  8.8  9.0      Assessment/Plan:  Principal Problem:   Acute congestive heart failure (HCC) Active Problems:   Alcohol  use disorder   Cocaine use   Cocaine abuse (HCC)   Acute systolic (congestive) heart failure (HCC)   Encephalopathy   Cardiogenic shock (HCC)   Palliative care patient  Acute on chronic biventricular sytolic heart failure Nonischemic cardiomyopathy per LHC in 2021 Severe mitral regurgitation )02/2023 Lactic Acidosis Encephalopathy Goals of care conversation previously during this admission: family elected to transition to comfort care. Anticipate in hospital death at this time as family did not initially want to transfer to [facility].  Palliative care is following, appreciate their assistance. - Comfort care orders managed by palliative   DM Polysubstance use disorder CIWA with ativan  Cocaine Use  COPD  HTN HLD CAD Hx Hepatitis C, untreated Patient transitioned to comfort care. See above    Resolved Problems:  __________________________________  Code Status: DNR comfort  Dispo: Anticipate in hospital death   Waymond Cart, MD Internal Medicine ResidencyPGY-1 12/04/2023, 7:47 AM  Please contact the on call pager at: [phone]

## 2023-12-04 NOTE — Progress Notes (Signed)
 Daily Progress Note   Date: 12/04/2023   Patient Name: Emma Hahn  DOB: 1950/12/27  MRN: [id number]  Age / Sex: 73 y.o., female  Attending Physician: Eben Reyes BROCKS, MD Primary Care Physician: Rosalea Rosina SAILOR, [place] Admit Date: 11/21/2023 Length of Stay: 12 days  Reason for Follow-up: Non pain symptom management, Pain control, Psychosocial/spiritual support, and Terminal Care  Past Medical History:  Diagnosis Date   Borderline diabetes    COPD (chronic obstructive pulmonary disease) (HCC)    Habitual alcohol  use    Tobacco abuse     Subjective:   Subjective: Chart Reviewed. Updates received. Patient Assessed. Created space and opportunity for patient  and family to explore thoughts and feelings regarding current medical situation.  Today's Discussion: Today before meeting with the patient/family, I reviewed the chart notes including nursing notes from yesterday and today, resident note from today. I also reviewed vital signs, nursing flowsheets, and medication administrations record. No labs due to comfort care status.  Vital signs today include temperature 97.6, heart rate 101, respiratory rate 16, blood pressure 118/84, satting 100% on room air.  Comfort medications administered include 0 mg of IV Ativan  in the past 24 hours.  Morphine  has been given as ordered 1 mg every 4 hours, no as needed doses in between in the past 24 hours.  Will continue parenteral controlled substances including benzodiazepines and opioids due to need thus far and anticipated future need. No need to increased the scheduled morphine  dose or decrease the interval at this time.  Today saw the patient at bedside, no family is present.  Today when I saw the patient she was lying on her side, appeared comfortable and peaceful.  Difficult to assess duration of apneic pauses in the setting of apparent Cheyne-Stokes breathing due to possibility of shallow breathing difficult to ascertain with  positioning.  She does appear to be continuing to be an active dying phase at end-of-life.  As previously noted, family does not want to transfer to hospice facility and preferred to remain in hospital for end-of-life.  After seeing the patient I reached out to her daughter Emma Hahn to update her on the patient's condition and let her know whether I felt driving down from [place]  would be appropriate.  I was unable to reach her but left a HIPAA appropriate voicemail.  I provided emotional and general support through reassurance of safety and comfort to the patient. I answered all questions and addressed all concerns to the best of my ability.  Review of Systems  Unable to perform ROS   Objective:   Primary Diagnoses: Present on Admission:  Acute systolic (congestive) heart failure (HCC)  Alcohol  use disorder   Vital Signs:  BP 118/84   Pulse (!) 101   Temp 97.6 F (36.4 C) (Oral)   Resp 16   Ht 5' 4 (1.626 m)   Wt 61.2 kg   SpO2 100%   BMI 23.16 kg/m   Physical Exam Vitals and nursing note reviewed.  Constitutional:      General: She is sleeping. She is not in acute distress. HENT:     Head: Normocephalic and atraumatic.  Pulmonary:     Effort: Pulmonary effort is normal. No respiratory distress.     Comments: Respiratory pattern and depth irregular, apneic pauses difficult to assess due to positioning Abdominal:     General: Abdomen is flat.  Skin:    General: Skin is warm and dry.     Palliative Assessment/Data: 10%  Existing Vynca/ACP Documentation: None  Assessment & Plan:   HPI/Patient Profile:  [name] is a 72 y.o. female with PMH of polysubtance use disorder, chronic combined systolic and diastolic heart failure, COPD, chronic kidney disease stage 3b, substance use disorder, recurrent hospitalizations. Palliative care has been requested to support goals of care conversations.   SUMMARY OF RECOMMENDATIONS   DNR-comfort Continued comfort  care See symptom management orders below Ongoing support of patient and family Previously offered hospice evaluation for inpatient hospice, family declines evaluation and transfer at this time preferring to remain inpatient Patient's daughter Emma Hahn likely driving down from [place]  tonight to visit Palliative medicine will continue to follow for ongoing symptom management  Symptom Management:  Tylenol  650 mg PR every 6 hours as needed mild pain (1-3), fever Biotene solution 15 mL topical as needed dry mouth Artificial tears 1 drop OU 4 times daily as needed dry eyes Robinul  0.2 mg IV every 4 hours as needed excessive secretions Haldol  0.5 mg IV every 4 hours as needed agitation or delirium Ativan  1 mg IV every 4 hours as needed anxiety Morphine  1 mg IV every 4 hours scheduled Morphine  1 to 4 mg IV every 15 minutes as needed severe pain (7/10), signs/symptoms of distress Zofran  4 mg IV every 6 hours as needed nausea  Code Status: DNR - Comfort  Prognosis: Hours - Days  Discharge Planning: Anticipated Hospital Death  Discussed with: Medical team, nursing team, patient's family (voicemail)  Thank you for allowing us  to participate in the care of Takelia Urieta PMT will continue to support holistically.  Billing based on MDM: High  Problems Addressed: One acute or chronic illness or injury that poses a threat to life or bodily function  Risks: Parenteral controlled substances  Detailed review of medical records (labs, imaging, vital signs), medically appropriate exam, discussed with treatment team, counseling and education to patient, family, & staff, documenting clinical information, medication management, coordination of care  Camellia Kays, NP Palliative Medicine Team  Team Phone # [phone] (Nights/Weekends)  09/13/2020, 8:17 AM

## 2023-12-05 DIAGNOSIS — Z515 Encounter for palliative care: Secondary | ICD-10-CM | POA: Diagnosis not present

## 2023-12-05 DIAGNOSIS — Z7189 Other specified counseling: Secondary | ICD-10-CM

## 2023-12-05 DIAGNOSIS — J449 Chronic obstructive pulmonary disease, unspecified: Secondary | ICD-10-CM | POA: Diagnosis not present

## 2023-12-05 DIAGNOSIS — I5082 Biventricular heart failure: Secondary | ICD-10-CM | POA: Diagnosis not present

## 2023-12-05 DIAGNOSIS — I5021 Acute systolic (congestive) heart failure: Secondary | ICD-10-CM | POA: Diagnosis not present

## 2023-12-05 DIAGNOSIS — I509 Heart failure, unspecified: Secondary | ICD-10-CM | POA: Diagnosis not present

## 2023-12-05 NOTE — Progress Notes (Signed)
 Daily Progress Note   Date: 12/05/2023   Patient Name: Emma Hahn  DOB: [birth year]  MRN: [id number]  Age / Sex: 73 y.o., female  Attending Physician: Emma Sick, MD Primary Care Physician: Emma Hahn Admit Date: 11/21/2023 Length of Stay: 13 days  Reason for Follow-up: Non pain symptom management, Pain control, Psychosocial/spiritual support, and Terminal Care  Past Medical History:  Diagnosis Date   Borderline diabetes    COPD (chronic obstructive pulmonary disease) (HCC)    Habitual alcohol  use    Tobacco abuse     Subjective:   Subjective: Chart Reviewed. Updates received. Patient Assessed. Created space and opportunity for patient  and family to explore thoughts and feelings regarding current medical situation.  Today's Discussion: Today before meeting with the patient/family, I reviewed the chart notes including nursing note from yesterday, resident note from yesterday, today, TOC note from today, resident note from today. I also reviewed vital signs, nursing flowsheets, and medication administrations record. No labs due to comfort care status.  Vital signs today include temperature 98.1, heart rate 81, respiratory rate 20, blood pressure 123/77, satting 95% on room air.  Comfort medications administered include no IV Ativan  in the past 24 hours.  Morphine  has been given as ordered 1 mg every 4 hours, no as needed doses in between in the past 24 hours.  Will continue parenteral controlled substances including benzodiazepines and opioids due to need thus far and anticipated future need. No need to increased the scheduled morphine  dose or decrease the interval at this time.  Today saw the patient at bedside, no family is present.  Today when I saw the patient she was groaning and appeared a bit restless.  She is having apnea pauses versus VERY shallow/indiscernible from apnea respirations for about 46 seconds at a time.  Her work of breathing is increased when  she is taking respirations and subsequently I requested nursing to assess the patient and provide a dose of morphine  and Ativan  to assist in the patient's comfort.  Palliative medicine will continue to follow for ongoing comfort care while inpatient.  Family has requested the patient to remain in the hospital and declines transfer to hospice facility.  Review of Systems  Unable to perform ROS   Objective:   Primary Diagnoses: Present on Admission:  Acute systolic (congestive) heart failure (HCC)  Alcohol  use disorder   Vital Signs:  BP 123/77 (BP Location: Left Arm)   Pulse 81   Temp 98.1 F (36.7 C) (Axillary)   Resp 20   Ht 5' 4 (1.626 m)   Wt 61.2 kg   SpO2 95%   BMI 23.16 kg/m   Physical Exam Vitals and nursing note reviewed.  Constitutional:      General: She is sleeping. She is not in acute distress. HENT:     Head: Normocephalic and atraumatic.  Pulmonary:     Effort: Pulmonary effort is normal. No respiratory distress.     Comments: Respiratory pattern and depth irregular, significant apneic pauses noted Abdominal:     General: Abdomen is flat.  Skin:    General: Skin is warm and dry.     Palliative Assessment/Data: 10%   Existing Vynca/ACP Documentation: None  Assessment & Plan:   HPI/Patient Profile:  Emma Hahn is a 73 y.o. female with PMH of polysubtance use disorder, chronic combined systolic and diastolic heart failure, COPD, chronic kidney disease stage 3b, substance use disorder, recurrent hospitalizations. Palliative care has been requested to support  goals of care conversations.   SUMMARY OF RECOMMENDATIONS   DNR-comfort Continued comfort care See symptom management orders below Ongoing support of patient and family Previously offered hospice evaluation for inpatient hospice, family declines evaluation and transfer Palliative medicine will continue to follow for ongoing symptom management  Symptom Management:  Tylenol  650 mg  PR every 6 hours as needed mild pain (1-3), fever Biotene solution 15 mL topical as needed dry mouth Artificial tears 1 drop OU 4 times daily as needed dry eyes Robinul  0.2 mg IV every 4 hours as needed excessive secretions Haldol  0.5 mg IV every 4 hours as needed agitation or delirium Ativan  1 mg IV every 4 hours as needed anxiety Morphine  1 mg IV every 4 hours scheduled Morphine  1 to 4 mg IV every 15 minutes as needed severe pain (7/10), signs/symptoms of distress Zofran  4 mg IV every 6 hours as needed nausea  Code Status: DNR - Comfort  Prognosis: Hours - Days  Discharge Planning: Anticipated Hospital Death  Discussed with: Medical team, nursing team  Thank you for allowing us  to participate in the care of Emma Hahn PMT will continue to support holistically.  Billing based on MDM: High  Problems Addressed: One acute or chronic illness or injury that poses a threat to life or bodily function  Risks: Parenteral controlled substances  Detailed review of medical records (labs, imaging, vital signs), medically appropriate exam, discussed with treatment team, counseling and education to patient, family, & staff, documenting clinical information, medication management, coordination of care  Emma Kays, NP Palliative Medicine Team  Team Phone # [phone] (Nights/Weekends)  09/13/2020, 8:17 AM

## 2023-12-05 NOTE — Progress Notes (Signed)
 Subjective:Emma Hahn is a 73 y.o. female with PMH of polysubtance use disorder, chronic combined systolic and diastolic heart failure, COPD, chronic kidney disease stage 3b, substance use disorder, recurrent hospitalizations, who was recently discharged from [facility] for exacerbation of her chronic HF. Found in [place] confused, brought to ED and admitted for acute on chronic HFrEF. Hospitalization has been complicated by development of cardiogenic shock and encephalopathy and was then transitioned to comfort care.   12/02/23: Discussed patient's prognosis and condition with [name] (sister) who agrees that patient should remain comfort care. Does not want to transfer the patient from hospital.  12/03/23: Per Palliative note, patient's family reiterated their preference to not transfer the patient to hospice facility.  Today, the patient remains minimally interactive in bed. Did not respond to verbal greetings or commands this morning.  Objective:  Vital signs in last 24 hours: Vitals:   12/04/23 0537 12/04/23 0548 12/04/23 1825 12/05/23 0431  BP: 118/84 118/84 129/85 123/77  Pulse: (!) 101 (!) 101 100 81  Resp: 16 16 16 20   Temp: 97.6 F (36.4 C) 97.6 F (36.4 C) (!) 97.4 F (36.3 C) 98.1 F (36.7 C)  TempSrc: Axillary Oral Oral Axillary  SpO2:  100% 93% 95%  Weight:      Height:       Physical Exam: General: Lying in bed, opens eyes intermittently, minimally interactive      Latest Ref Rng & Units 11/26/2023    4:25 AM 11/23/2023    6:57 AM 11/22/2023    2:42 AM  CBC  WBC 4.0 - 10.5 K/uL 8.7  9.8  7.6   Hemoglobin 12.0 - 15.0 g/dL 89.6  89.6  9.5   Hematocrit 36.0 - 46.0 % 31.7  32.4  30.4   Platelets 150 - 400 K/uL 190  196  195         Latest Ref Rng & Units 11/26/2023    4:25 AM 11/25/2023    3:36 AM 11/24/2023    7:50 AM  BMP  Glucose 70 - 99 mg/dL 886  832  863   BUN 8 - 23 mg/dL 18  24  19    Creatinine 0.44 - 1.00 mg/dL 8.74   8.65  8.74   Sodium 135 - 145 mmol/L 139  136  136   Potassium 3.5 - 5.1 mmol/L 3.2  3.8  4.4   Chloride 98 - 111 mmol/L 103  102  102   CO2 22 - 32 mmol/L 23  24  19    Calcium  8.9 - 10.3 mg/dL 8.4  8.8  9.0      Assessment/Plan:  Principal Problem:   Acute congestive heart failure (HCC) Active Problems:   Alcohol use disorder   Cocaine use   Cocaine abuse (HCC)   Acute systolic (congestive) heart failure (HCC)   Encephalopathy   Cardiogenic shock (HCC)   Palliative care patient  Acute on chronic biventricular sytolic heart failure Nonischemic cardiomyopathy per LHC in 2021 Severe mitral regurgitation )02/2023 Lactic Acidosis Encephalopathy Goals of care conversation previously during this admission: family elected to transition to comfort care. Anticipate in hospital death at this time as family did not initially want to transfer to [facility]  Palliative care is following, appreciate their assistance. - Comfort care orders managed by palliative   DM Polysubstance use disorder CIWA with ativan  Cocaine Use  COPD  HTN HLD CAD Hx Hepatitis C, untreated Patient transitioned to comfort care. See above  Resolved Problems:  __________________________________  Code Status: DNR comfort  Dispo: Anticipate in hospital death   Waymond Cart, MD Internal Medicine ResidencyPGY-1 12/05/2023, 10:41 AM  Please contact the on call pager at: [phone]

## 2023-12-05 NOTE — Plan of Care (Signed)
  Problem: Fluid Volume: Goal: Ability to maintain a balanced intake and output will improve Outcome: Progressing   Problem: Health Behavior/Discharge Planning: Goal: Ability to identify and utilize available resources and services will improve Outcome: Progressing Goal: Ability to manage health-related needs will improve Outcome: Progressing   Problem: Metabolic: Goal: Ability to maintain appropriate glucose levels will improve Outcome: Progressing   Problem: Nutritional: Goal: Maintenance of adequate nutrition will improve Outcome: Progressing Goal: Progress toward achieving an optimal weight will improve Outcome: Progressing   Problem: Skin Integrity: Goal: Risk for impaired skin integrity will decrease Outcome: Progressing   Problem: Tissue Perfusion: Goal: Adequacy of tissue perfusion will improve Outcome: Progressing   Problem: Education: Goal: Knowledge of General Education information will improve Description: Including pain rating scale, medication(s)/side effects and non-pharmacologic comfort measures Outcome: Progressing   Problem: Health Behavior/Discharge Planning: Goal: Ability to manage health-related needs will improve Outcome: Progressing   Problem: Clinical Measurements: Goal: Ability to maintain clinical measurements within normal limits will improve Outcome: Progressing Goal: Will remain free from infection Outcome: Progressing Goal: Diagnostic test results will improve Outcome: Progressing Goal: Respiratory complications will improve Outcome: Progressing Goal: Cardiovascular complication will be avoided Outcome: Progressing   Problem: Activity: Goal: Risk for activity intolerance will decrease Outcome: Progressing   Problem: Nutrition: Goal: Adequate nutrition will be maintained Outcome: Progressing   Problem: Coping: Goal: Level of anxiety will decrease Outcome: Progressing   Problem: Elimination: Goal: Will not experience  complications related to bowel motility Outcome: Progressing Goal: Will not experience complications related to urinary retention Outcome: Progressing   Problem: Pain Managment: Goal: General experience of comfort will improve and/or be controlled Outcome: Progressing   Problem: Safety: Goal: Ability to remain free from injury will improve Outcome: Progressing   Problem: Skin Integrity: Goal: Risk for impaired skin integrity will decrease Outcome: Progressing   Problem: Education: Goal: Knowledge of the prescribed therapeutic regimen will improve Outcome: Progressing   Problem: Coping: Goal: Ability to identify and develop effective coping behavior will improve Outcome: Progressing   Problem: Clinical Measurements: Goal: Quality of life will improve Outcome: Progressing   Problem: Respiratory: Goal: Verbalizations of increased ease of respirations will increase Outcome: Progressing   Problem: Role Relationship: Goal: Family's ability to cope with current situation will improve Outcome: Progressing Goal: Ability to verbalize concerns, feelings, and thoughts to partner or family member will improve Outcome: Progressing   Problem: Pain Management: Goal: Satisfaction with pain management regimen will improve Outcome: Progressing

## 2023-12-06 DIAGNOSIS — Z515 Encounter for palliative care: Secondary | ICD-10-CM | POA: Diagnosis not present

## 2023-12-06 DIAGNOSIS — Z66 Do not resuscitate: Secondary | ICD-10-CM

## 2023-12-06 DIAGNOSIS — I509 Heart failure, unspecified: Secondary | ICD-10-CM | POA: Diagnosis not present

## 2023-12-06 DIAGNOSIS — Z7189 Other specified counseling: Secondary | ICD-10-CM | POA: Diagnosis not present

## 2023-12-06 DIAGNOSIS — I5043 Acute on chronic combined systolic (congestive) and diastolic (congestive) heart failure: Secondary | ICD-10-CM | POA: Diagnosis not present

## 2023-12-06 DIAGNOSIS — I5021 Acute systolic (congestive) heart failure: Secondary | ICD-10-CM | POA: Diagnosis not present

## 2023-12-06 DIAGNOSIS — I5082 Biventricular heart failure: Secondary | ICD-10-CM | POA: Diagnosis not present

## 2023-12-06 NOTE — Plan of Care (Signed)
  Problem: Pain Managment: Goal: General experience of comfort will improve and/or be controlled 12/06/2023 2032 by Nioka Thorington K, RN Outcome: Progressing 12/06/2023 2031 by Florian Dellis POUR, RN Outcome: Progressing 12/06/2023 2031 by Florian Dellis POUR, RN Outcome: Progressing 12/06/2023 2030 by Aquila Delaughter K, RN Outcome: Not Progressing   Problem: Safety: Goal: Ability to remain free from injury will improve 12/06/2023 2032 by Kedrick Mcnamee K, RN Outcome: Progressing 12/06/2023 2031 by Florian Dellis POUR, RN Outcome: Progressing 12/06/2023 2031 by Shanyce Daris K, RN Outcome: Not Progressing 12/06/2023 2030 by Dyanne Yorks K, RN Outcome: Not Progressing

## 2023-12-06 NOTE — TOC Progression Note (Signed)
 Transition of Care [facility]) - Progression Note    Patient Details  Name: Emma Hahn MRN: [id number] Date of Birth: 1950-08-28  [facility]) CM/SW Contact  Ovadia Lopp LITTIE [place], [place] Phone Number: 12/06/2023, 8:31 AM  Clinical Narrative:    Pt is currently anticipated hospital death. CSW will continue to follow.   Expected Discharge Plan:  (TBD) Barriers to Discharge: Continued Medical Work up               Expected Discharge Plan and Services In-house Referral: Clinical Social Work Discharge Planning Services: CM Consult   Living arrangements for the past 2 months: Single Family Home                                       Social Drivers of Health (SDOH) Interventions SDOH Screenings   Food Insecurity: No Food Insecurity (11/21/2023)  Housing: Low Risk  (11/21/2023)  Transportation Needs: No Transportation Needs (11/21/2023)  Utilities: Not At Risk (11/21/2023)  Financial Resource Strain: Medium Risk (07/11/2021)  Social Connections: Patient Unable To Answer (11/27/2023)  Tobacco Use: High Risk (11/27/2023)    Readmission Risk Interventions    07/14/2021    2:13 PM  Readmission Risk Prevention Plan  Post Dischage Appt Complete  Medication Screening Complete  Transportation Screening Complete

## 2023-12-06 NOTE — Plan of Care (Signed)
  Problem: Fluid Volume: Goal: Ability to maintain a balanced intake and output will improve Outcome: Not Progressing   Problem: Health Behavior/Discharge Planning: Goal: Ability to identify and utilize available resources and services will improve Outcome: Not Progressing Goal: Ability to manage health-related needs will improve Outcome: Not Progressing   Problem: Metabolic: Goal: Ability to maintain appropriate glucose levels will improve Outcome: Not Progressing   Problem: Nutritional: Goal: Maintenance of adequate nutrition will improve Outcome: Not Progressing Goal: Progress toward achieving an optimal weight will improve Outcome: Not Progressing   Problem: Skin Integrity: Goal: Risk for impaired skin integrity will decrease Outcome: Not Progressing   Problem: Tissue Perfusion: Goal: Adequacy of tissue perfusion will improve Outcome: Not Progressing   Problem: Education: Goal: Knowledge of General Education information will improve Description: Including pain rating scale, medication(s)/side effects and non-pharmacologic comfort measures Outcome: Not Progressing   Problem: Health Behavior/Discharge Planning: Goal: Ability to manage health-related needs will improve Outcome: Not Progressing   Problem: Clinical Measurements: Goal: Ability to maintain clinical measurements within normal limits will improve Outcome: Not Progressing Goal: Will remain free from infection Outcome: Not Progressing Goal: Diagnostic test results will improve Outcome: Not Progressing Goal: Respiratory complications will improve Outcome: Not Progressing Goal: Cardiovascular complication will be avoided Outcome: Not Progressing   Problem: Activity: Goal: Risk for activity intolerance will decrease Outcome: Not Progressing   Problem: Nutrition: Goal: Adequate nutrition will be maintained Outcome: Not Progressing   Problem: Coping: Goal: Level of anxiety will decrease Outcome: Not  Progressing   Problem: Elimination: Goal: Will not experience complications related to bowel motility Outcome: Not Progressing Goal: Will not experience complications related to urinary retention Outcome: Not Progressing   Problem: Pain Managment: Goal: General experience of comfort will improve and/or be controlled Outcome: Not Progressing   Problem: Safety: Goal: Ability to remain free from injury will improve Outcome: Not Progressing   Problem: Skin Integrity: Goal: Risk for impaired skin integrity will decrease Outcome: Not Progressing   Problem: Education: Goal: Knowledge of the prescribed therapeutic regimen will improve Outcome: Not Progressing   Problem: Coping: Goal: Ability to identify and develop effective coping behavior will improve Outcome: Not Progressing   Problem: Clinical Measurements: Goal: Quality of life will improve Outcome: Not Progressing   Problem: Respiratory: Goal: Verbalizations of increased ease of respirations will increase Outcome: Not Progressing   Problem: Role Relationship: Goal: Family's ability to cope with current situation will improve Outcome: Not Progressing Goal: Ability to verbalize concerns, feelings, and thoughts to partner or family member will improve Outcome: Not Progressing   Problem: Pain Management: Goal: Satisfaction with pain management regimen will improve Outcome: Not Progressing

## 2023-12-06 NOTE — Progress Notes (Signed)
 Subjective:Emma Hahn is a 73 y.o. female with PMH of polysubtance use disorder, chronic combined systolic and diastolic heart failure, COPD, chronic kidney disease stage 3b, substance use disorder, recurrent hospitalizations, who was recently discharged from [facility] for exacerbation of her chronic HF. Found in [place] confused, brought to ED and admitted for acute on chronic HFrEF. Hospitalization has been complicated by development of cardiogenic shock and encephalopathy and was then transitioned to comfort care.   12/02/23: Discussed patient's prognosis and condition with [name] (sister) who agrees that patient should remain comfort care. Does not want to transfer the patient from hospital.  12/03/23: Per Palliative note, patient's family reiterated their preference to not transfer the patient to hospice facility.  Today, the patient remains minimally interactive in bed. She did not respond to greetings or questions.  Objective:  Vital signs in last 24 hours: Vitals:   12/04/23 0548 12/04/23 1825 12/05/23 0431 12/06/23 0605  BP: 118/84 129/85 123/77 131/89  Pulse: (!) 101 100 81 (!) 107  Resp: 16 16 20 19   Temp: 97.6 F (36.4 C) (!) 97.4 F (36.3 C) 98.1 F (36.7 C) 97.7 F (36.5 C)  TempSrc: Oral Oral Axillary Oral  SpO2: 100% 93% 95% (!) 40%  Weight:      Height:       Physical Exam: General: Lying in bed, opens eyes intermittently, minimally interactive      Latest Ref Rng & Units 11/26/2023    4:25 AM 11/23/2023    6:57 AM 11/22/2023    2:42 AM  CBC  WBC 4.0 - 10.5 K/uL 8.7  9.8  7.6   Hemoglobin 12.0 - 15.0 g/dL 89.6  89.6  9.5   Hematocrit 36.0 - 46.0 % 31.7  32.4  30.4   Platelets 150 - 400 K/uL 190  196  195         Latest Ref Rng & Units 11/26/2023    4:25 AM 11/25/2023    3:36 AM 11/24/2023    7:50 AM  BMP  Glucose 70 - 99 mg/dL 886  832  863   BUN 8 - 23 mg/dL 18  24  19    Creatinine 0.44 - 1.00 mg/dL 8.74  8.65  8.74    Sodium 135 - 145 mmol/L 139  136  136   Potassium 3.5 - 5.1 mmol/L 3.2  3.8  4.4   Chloride 98 - 111 mmol/L 103  102  102   CO2 22 - 32 mmol/L 23  24  19    Calcium  8.9 - 10.3 mg/dL 8.4  8.8  9.0      Assessment/Plan:  Principal Problem:   Acute congestive heart failure (HCC) Active Problems:   Alcohol  use disorder   Cocaine use   Cocaine abuse (HCC)   Acute systolic (congestive) heart failure (HCC)   Encephalopathy   Cardiogenic shock (HCC)   Palliative care patient  Acute on chronic biventricular sytolic heart failure Nonischemic cardiomyopathy per LHC in 2021 Severe mitral regurgitation )02/2023 Lactic Acidosis Encephalopathy Goals of care conversation previously during this admission: family elected to transition to comfort care. Anticipate in hospital death at this time as family did not initially want to transfer to [facility].  Palliative care is following, appreciate their assistance. - Comfort care orders managed by palliative   DM Polysubstance use disorder CIWA with ativan  Cocaine Use  COPD  HTN HLD CAD Hx Hepatitis C, untreated Patient transitioned to comfort care. See above  Resolved Problems:  __________________________________  Code Status: DNR comfort  Dispo: Anticipate in hospital death   Waymond Cart, MD Internal Medicine ResidencyPGY-1 12/06/2023, 8:58 AM  Please contact the on call pager at: [phone]

## 2023-12-06 NOTE — TOC Progression Note (Signed)
 Transition of Care (TOC) - Progression Note   Dr Letha Cheadle discussed residential hospice with family. They are agreeable to [facility].   Referral given to [facility] , they do not have a bed available today  Patient Details  Name: Emma Hahn MRN: [id number] Date of Birth: [birth year]  Transition of Care [facility]) CM/SW Contact  Nayquan Evinger, Powell Jansky, RN Phone Number: 12/06/2023, 2:55 PM  Clinical Narrative:       Expected Discharge Plan:  (TBD) Barriers to Discharge: Continued Medical Work up               Expected Discharge Plan and Services In-house Referral: Clinical Social Work Discharge Planning Services: CM Consult   Living arrangements for the past 2 months: Single Family Home                                       Social Drivers of Health (SDOH) Interventions SDOH Screenings   Food Insecurity: No Food Insecurity (11/21/2023)  Housing: Low Risk  (11/21/2023)  Transportation Needs: No Transportation Needs (11/21/2023)  Utilities: Not At Risk (11/21/2023)  Financial Resource Strain: Medium Risk (07/11/2021)  Social Connections: Patient Unable To Answer (11/27/2023)  Tobacco Use: High Risk (11/27/2023)    Readmission Risk Interventions    07/14/2021    2:13 PM  Readmission Risk Prevention Plan  Post Dischage Appt Complete  Medication Screening Complete  Transportation Screening Complete

## 2023-12-06 NOTE — Plan of Care (Signed)
  Problem: Pain Managment: Goal: General experience of comfort will improve and/or be controlled 12/06/2023 2031 by Vianka Ertel K, RN Outcome: Progressing 12/06/2023 2031 by Florian Dellis POUR, RN Outcome: Progressing 12/06/2023 2030 by Rickia Freeburg K, RN Outcome: Not Progressing   Problem: Safety: Goal: Ability to remain free from injury will improve 12/06/2023 2031 by Loula Marcella K, RN Outcome: Progressing 12/06/2023 2031 by Florian Dellis POUR, RN Outcome: Not Progressing 12/06/2023 2030 by Monica Codd K, RN Outcome: Not Progressing

## 2023-12-06 NOTE — Plan of Care (Signed)
  Problem: Fluid Volume: Goal: Ability to maintain a balanced intake and output will improve Outcome: Progressing   Problem: Health Behavior/Discharge Planning: Goal: Ability to identify and utilize available resources and services will improve Outcome: Progressing Goal: Ability to manage health-related needs will improve Outcome: Progressing   Problem: Metabolic: Goal: Ability to maintain appropriate glucose levels will improve Outcome: Progressing   Problem: Nutritional: Goal: Maintenance of adequate nutrition will improve Outcome: Progressing Goal: Progress toward achieving an optimal weight will improve Outcome: Progressing   Problem: Skin Integrity: Goal: Risk for impaired skin integrity will decrease Outcome: Progressing   Problem: Tissue Perfusion: Goal: Adequacy of tissue perfusion will improve Outcome: Progressing   Problem: Education: Goal: Knowledge of General Education information will improve Description: Including pain rating scale, medication(s)/side effects and non-pharmacologic comfort measures Outcome: Progressing   Problem: Health Behavior/Discharge Planning: Goal: Ability to manage health-related needs will improve Outcome: Progressing   Problem: Clinical Measurements: Goal: Ability to maintain clinical measurements within normal limits will improve Outcome: Progressing Goal: Will remain free from infection Outcome: Progressing Goal: Diagnostic test results will improve Outcome: Progressing Goal: Respiratory complications will improve Outcome: Progressing Goal: Cardiovascular complication will be avoided Outcome: Progressing   Problem: Activity: Goal: Risk for activity intolerance will decrease Outcome: Progressing   Problem: Nutrition: Goal: Adequate nutrition will be maintained Outcome: Progressing   Problem: Coping: Goal: Level of anxiety will decrease Outcome: Progressing   Problem: Elimination: Goal: Will not experience  complications related to bowel motility Outcome: Progressing Goal: Will not experience complications related to urinary retention Outcome: Progressing   Problem: Pain Managment: Goal: General experience of comfort will improve and/or be controlled Outcome: Progressing   Problem: Safety: Goal: Ability to remain free from injury will improve Outcome: Progressing   Problem: Skin Integrity: Goal: Risk for impaired skin integrity will decrease Outcome: Progressing   Problem: Education: Goal: Knowledge of the prescribed therapeutic regimen will improve Outcome: Progressing   Problem: Coping: Goal: Ability to identify and develop effective coping behavior will improve Outcome: Progressing   Problem: Clinical Measurements: Goal: Quality of life will improve Outcome: Progressing   Problem: Respiratory: Goal: Verbalizations of increased ease of respirations will increase Outcome: Progressing   Problem: Role Relationship: Goal: Family's ability to cope with current situation will improve Outcome: Progressing Goal: Ability to verbalize concerns, feelings, and thoughts to partner or family member will improve Outcome: Progressing   Problem: Pain Management: Goal: Satisfaction with pain management regimen will improve Outcome: Progressing

## 2023-12-06 NOTE — Progress Notes (Signed)
 Daily Progress Note   Date: 12/06/2023   Patient Name: Emma Hahn  DOB: [birth year]  MRN: [id number]  Age / Sex: 73 y.o., female  Attending Physician: Shawn Sick, MD Primary Care Physician: Rosalea Rosina SAILOR, [place] Admit Date: 11/21/2023 Length of Stay: 14 days  Reason for Follow-up: Non pain symptom management, Pain control, Psychosocial/spiritual support, and Terminal Care  Past Medical History:  Diagnosis Date   Borderline diabetes    COPD (chronic obstructive pulmonary disease) (HCC)    Habitual alcohol  use    Tobacco abuse     Subjective:   Subjective: Chart Reviewed. Updates received. Patient Assessed. Created space and opportunity for patient  and family to explore thoughts and feelings regarding current medical situation.  Today's Discussion: Today before meeting with the patient/family, I reviewed the chart notes including nursing note from yesterday, resident note from yesterday, today, TOC note from today, resident note from today. I also reviewed vital signs, nursing flowsheets, and medication administrations record. No labs due to comfort care status.  Vital signs today include temperature 97.7, heart rate 107, respiratory rate 19, blood pressure 131/89, satting 40% on room air.  Comfort medications administered include 1 dose IV Ativan  in the past 24 hours at 2:42 PM yesterday.  Morphine  has been given as ordered 1 mg every 4 hours, no as needed doses in between in the past 24 hours.  Will continue parenteral controlled substances including benzodiazepines and opioids due to need thus far and anticipated future need. No need to increased the scheduled morphine  dose or decrease the interval at this time.  Today saw the patient at bedside, no family is present.  Today when I saw the patient she appeared a bit restless and uncomfortable.  She is having apnea pauses versus VERY shallow/indiscernible from apnea respirations for about 50 seconds at a time.  While she is  not having apnea, her work of breathing is increased when she and subsequently I requested nursing to assess the patient and provide a dose of morphine  to assist in the patient's comfort.  Palliative medicine will continue to follow for ongoing comfort care while inpatient.  Family has requested the patient to remain in the hospital and declines transfer to hospice facility.  ADDENDUM: Later in today I received a message from the resident along with TOC.  Resident states that they reach out to patient's family and are now agreeable to transition to [facility].  TOC case manager expressed that during rounds nursing feels that she will likely pass today.  Resident feels that this has been the case for several days.  TOC included [employer] in the conversation.  Resident states that the patient is stable for transfer.  Review of Systems  Unable to perform ROS   Objective:   Primary Diagnoses: Present on Admission:  Acute systolic (congestive) heart failure (HCC)  Alcohol  use disorder   Vital Signs:  BP 131/89 (BP Location: Left Arm)   Pulse (!) 107   Temp 97.7 F (36.5 C) (Oral)   Resp 19   Ht 5' 4 (1.626 m)   Wt 61.2 kg   SpO2 (!) 40%   BMI 23.16 kg/m   Physical Exam Vitals and nursing note reviewed.  Constitutional:      General: She is sleeping. She is not in acute distress. HENT:     Head: Normocephalic and atraumatic.  Pulmonary:     Effort: Respiratory distress (Increased work of breathing) present.     Comments: Respiratory pattern and  depth irregular, significant apneic pauses noted Abdominal:     General: Abdomen is flat.  Skin:    General: Skin is warm and dry.     Palliative Assessment/Data: 10%   Existing Vynca/ACP Documentation: None  Assessment & Plan:   HPI/Patient Profile:  Emma Hahn is a 73 y.o. female with PMH of polysubtance use disorder, chronic combined systolic and diastolic heart failure, COPD, chronic kidney disease  stage 3b, substance use disorder, recurrent hospitalizations. Palliative care has been requested to support goals of care conversations.   SUMMARY OF RECOMMENDATIONS   DNR-comfort Continued comfort care See symptom management orders below Ongoing support of patient and family Previously offered hospice evaluation for inpatient hospice, family declines evaluation and transfer Palliative medicine will continue to follow for ongoing symptom management  Symptom Management:  Tylenol  650 mg PR every 6 hours as needed mild pain (1-3), fever Biotene solution 15 mL topical as needed dry mouth Artificial tears 1 drop OU 4 times daily as needed dry eyes Robinul  0.2 mg IV every 4 hours as needed excessive secretions Haldol  0.5 mg IV every 4 hours as needed agitation or delirium Ativan  1 mg IV every 4 hours as needed anxiety Morphine  1 mg IV every 4 hours scheduled Morphine  1 to 4 mg IV every 15 minutes as needed severe pain (7/10), signs/symptoms of distress Zofran  4 mg IV every 6 hours as needed nausea  Code Status: DNR - Comfort  Prognosis: Hours - Days  Discharge Planning: Anticipated Hospital Death vs. IP hospice  Discussed with: Medical team, nursing team  Thank you for allowing us  to participate in the care of Emma Hahn PMT will continue to support holistically.  Billing based on MDM: High  Problems Addressed: One acute or chronic illness or injury that poses a threat to life or bodily function  Risks: Parenteral controlled substances  Detailed review of medical records (labs, imaging, vital signs), medically appropriate exam, discussed with treatment team, counseling and education to patient, family, & staff, documenting clinical information, medication management, coordination of care  Emma Kays, NP Palliative Medicine Team  Team Phone # [phone] (Nights/Weekends)  09/13/2020, 8:17 AM

## 2023-12-07 DIAGNOSIS — I509 Heart failure, unspecified: Secondary | ICD-10-CM | POA: Diagnosis not present

## 2023-12-07 DIAGNOSIS — Z66 Do not resuscitate: Secondary | ICD-10-CM | POA: Diagnosis not present

## 2023-12-07 DIAGNOSIS — I5082 Biventricular heart failure: Secondary | ICD-10-CM | POA: Diagnosis not present

## 2023-12-07 DIAGNOSIS — I5043 Acute on chronic combined systolic (congestive) and diastolic (congestive) heart failure: Secondary | ICD-10-CM | POA: Diagnosis not present

## 2023-12-07 DIAGNOSIS — Z515 Encounter for palliative care: Secondary | ICD-10-CM | POA: Diagnosis not present

## 2023-12-07 MED ORDER — SODIUM CHLORIDE 0.9% FLUSH
10.0000 mL | Freq: Two times a day (BID) | INTRAVENOUS | Status: DC
  Administered 2023-12-07 – 2023-12-09 (×5): 10 mL

## 2023-12-07 MED ORDER — SODIUM CHLORIDE 0.9% FLUSH: 10.0000 mL | INTRAVENOUS | Status: DC | PRN

## 2023-12-07 MED ORDER — CHLORHEXIDINE GLUCONATE CLOTH 2 % EX PADS
6.0000 | MEDICATED_PAD | Freq: Every day | CUTANEOUS | Status: DC
  Administered 2023-12-07 – 2023-12-09 (×3): 6 via TOPICAL

## 2023-12-07 NOTE — Progress Notes (Signed)
 Emma Hahn [phone]  [facility] Liaison Note:   Referral received for [name].    At this time [name] does not have any beds to offer. We will follow up for evaluation of appropriateness once a bed becomes available.    Thank you for the opportunity to participate in this patient's plan of care.  [name], BSN, [employer] [phone]

## 2023-12-07 NOTE — Progress Notes (Signed)
 Subjective:Emma Hahn is a 73 y.o. female with PMH of polysubtance use disorder, chronic combined systolic and diastolic heart failure, COPD, chronic kidney disease stage 3b, substance use disorder, recurrent hospitalizations, who was recently discharged from [facility] [place]  [facility] for exacerbation of her chronic HF. Found in [place] confused, brought to ED and admitted for acute on chronic HFrEF. Hospitalization has been complicated by development of cardiogenic shock and encephalopathy and was then transitioned to comfort care.   12/02/23: Discussed patient's prognosis and condition with Emma Hahn (sister) who agrees that patient should remain comfort care. Does not want to transfer the patient from hospital.  12/03/23: Per Palliative note, patient's family reiterated their preference to not transfer the patient to hospice facility.  12/06/23: Discussed with daughter, Emma Hahn who states now she would like patient to be transferred to [facility]. No beds were available at that time.  Today, the patient remains minimally interactive in bed. She did not respond to greetings or questions.  Objective:  Vital signs in last 24 hours: Vitals:   12/04/23 1825 12/05/23 0431 12/06/23 0605 12/07/23 0612  BP: 129/85 123/77 131/89 (!) 140/84  Pulse: 100 81 (!) 107 94  Resp: 16 20 19    Temp: (!) 97.4 F (36.3 C) 98.1 F (36.7 C) 97.7 F (36.5 C) 97.8 F (36.6 C)  TempSrc: Oral Axillary Oral Oral  SpO2: 93% 95% (!) 40% 94%  Weight:      Height:       Physical Exam: General: Lying in bed, opens eyes intermittently, minimally interactive. Appears comfortable.      Latest Ref Rng & Units 11/26/2023    4:25 AM 11/23/2023    6:57 AM 11/22/2023    2:42 AM  CBC  WBC 4.0 - 10.5 K/uL 8.7  9.8  7.6   Hemoglobin 12.0 - 15.0 g/dL 89.6  89.6  9.5   Hematocrit 36.0 - 46.0 % 31.7  32.4  30.4   Platelets 150 - 400 K/uL 190  196  195         Latest Ref Rng & Units 11/26/2023     4:25 AM 11/25/2023    3:36 AM 11/24/2023    7:50 AM  BMP  Glucose 70 - 99 mg/dL 886  832  863   BUN 8 - 23 mg/dL 18  24  19    Creatinine 0.44 - 1.00 mg/dL 8.74  8.65  8.74   Sodium 135 - 145 mmol/L 139  136  136   Potassium 3.5 - 5.1 mmol/L 3.2  3.8  4.4   Chloride 98 - 111 mmol/L 103  102  102   CO2 22 - 32 mmol/L 23  24  19    Calcium  8.9 - 10.3 mg/dL 8.4  8.8  9.0      Assessment/Plan:  Principal Problem:   Acute congestive heart failure (HCC) Active Problems:   Alcohol  use disorder   Cocaine use   Cocaine abuse (HCC)   Acute systolic (congestive) heart failure (HCC)   Encephalopathy   Cardiogenic shock (HCC)   Palliative care patient  Acute on chronic biventricular sytolic heart failure Nonischemic cardiomyopathy per LHC in 2021 Severe mitral regurgitation )02/2023 Lactic Acidosis Encephalopathy Goals of care conversation previously during this admission: family elected to transition to comfort care. On 12/06/23 patient's family changed their minds and would like transfer to hospice facility. Awaiting bed availability at [facility].  Palliative care is following, appreciate their assistance. - Comfort care orders managed by Palliative  DM Polysubstance use disorder CIWA with ativan  Cocaine Use  COPD  HTN HLD CAD Hx Hepatitis C, untreated Patient transitioned to comfort care. See above   Code Status: DNR comfort  Dispo: Anticipate in hospital death   Waymond Cart, MD Internal Medicine ResidencyPGY-1 12/07/2023, 9:57 AM  Please contact the on call pager at: [phone]

## 2023-12-07 NOTE — Progress Notes (Signed)
 Daily Progress Note   Patient Name: Emma Hahn       Date: 12/07/2023 DOB: August 14, 1950  Age: 73 y.o. MRN#: [id number] Attending Physician: Shawn Sick, MD Primary Care Physician: Rosalea Rosina SAILOR, [place] Admit Date: 11/21/2023  Reason for Consultation/Follow-up: Establishing goals of care  Length of Stay: 15  Current Medications: Scheduled Meds:   Chlorhexidine  Gluconate Cloth  6 each Topical Daily    morphine  injection  1 mg Intravenous Q4H   nicotine   7 mg Transdermal Daily   mouth rinse  15 mL Mouth Rinse 4 times per day   sodium chloride  flush  10-40 mL Intracatheter Q12H   sodium chloride  flush  10-40 mL Intracatheter Q12H    Continuous Infusions:   PRN Meds: acetaminophen  **OR** acetaminophen , albuterol , antiseptic oral rinse, artificial tears, glycopyrrolate  **OR** glycopyrrolate  **OR** glycopyrrolate , haloperidol  **OR** haloperidol  **OR** haloperidol  lactate, LORazepam  **OR** LORazepam  **OR** LORazepam , morphine  injection, ondansetron  **OR** ondansetron  (ZOFRAN ) IV, mouth rinse, senna-docusate, sodium chloride  flush, sodium chloride  flush  Physical Exam Vitals reviewed.  Constitutional:      General: She is not in acute distress.    Appearance: She is ill-appearing.     Comments: Eyes are open but she does not respond  HENT:     Head: Normocephalic and atraumatic.     Mouth/Throat:     Mouth: Mucous membranes are dry.  Pulmonary:     Comments: Irregular  Skin:    General: Skin is dry.             Vital Signs: BP (!) 140/84 (BP Location: Left Arm)   Pulse 94   Temp 97.8 F (36.6 C) (Oral)   Resp 19   Ht 5' 4 (1.626 m)   Wt 61.2 kg   SpO2 94%   BMI 23.16 kg/m  SpO2: SpO2: 94 % O2 Device: O2 Device: Room Air       Palliative  Assessment/Data:10%      Patient Active Problem List   Diagnosis Date Noted   Palliative care patient 12/02/2023   Cardiogenic shock (HCC) 11/25/2023   Encephalopathy 11/24/2023   Cocaine abuse (HCC) 11/22/2023   Acute systolic (congestive) heart failure (HCC) 11/22/2023   Cocaine use 11/21/2023   Hypomagnesemia 03/05/2023   HTN (hypertension) 03/05/2023   Hyperlipidemia 03/05/2023   Acute pulmonary edema (HCC) 03/04/2023  COPD with acute exacerbation (HCC) 03/04/2023   AKI (acute kidney injury)    Acute congestive heart failure (HCC) 07/11/2021   MR (mitral regurgitation) 07/11/2021   Financial difficulties 12/10/2019   Alcohol  use disorder 12/09/2019   Acute respiratory failure with hypoxia (HCC) 12/08/2019   Acute on chronic systolic CHF (congestive heart failure) (HCC) 12/06/2019   Tobacco abuse 12/06/2019   Prolonged QT interval 12/06/2019   Chest pain 12/06/2019   Elevated troponin 12/06/2019   Prediabetes 12/06/2019   Do not resuscitate 12/06/2019    Palliative Care Assessment & Plan   Patient Profile: Emma Hahn is a 73 y.o. female with PMH of polysubtance use disorder, chronic combined systolic and diastolic heart failure, COPD, chronic kidney disease stage 3b, substance use disorder, recurrent hospitalizations. Palliative care has been requested to support goals of care conversations.   Today's Discussion: Reviewed chart. Patient on comfort measures.  Patient continues with scheduled IV morphine  every four hours. Patient did not require prn medications in the last 24 hours. Patient's eyes are open but she does not respond when I speak to her. She appears comfortable and in no apparent distress. Her breathing is irregular. No family at bedside.   Yesterday family changed their mind about end of life location and are now agreeable to residential hospice- [facility] preference. No beds were available yesterday.  PMT will continue to  support.   Recommendations/Plan: DNR- comfort PMT will continue to follow    Code Status:    Code Status Orders  (From admission, onward)           Start     Ordered   11/26/23 1435  Do not attempt resuscitation (DNR) - Comfort care  Continuous       Question Answer Comment  If patient has no pulse and is not breathing Do Not Attempt Resuscitation   In Pre-Arrest Conditions (Patient Is Breathing and Has a Pulse) Provide comfort measures. Relieve any mechanical airway obstruction. Avoid transfer unless required for comfort.   Consent: Discussion documented in EHR or advanced directives reviewed      11/26/23 1438         Extensive chart review has been completed prior to seeing the patient including vital signs, progress/consult notes, orders, medications, and available advance directive documents.  Care plan was discussed with bedside RN   Time spent: 25 minutes  Thank you for allowing the Palliative Medicine Team to assist in the care of this patient.    Stephane CHRISTELLA Palin, NP  Please contact Palliative Medicine Team phone at [phone] for questions and concerns.

## 2023-12-07 NOTE — Discharge Summary (Signed)
 Name: Emma Hahn MRN: [id number] DOB: [birth year] 73 y.o. PCP: Rosalea Rosina SAILOR, [place]  Date of Admission: 11/21/2023  7:51 AM Date of Discharge: 12/09/2023  Attending Physician: Dr. Jone Dauphin  Discharge Diagnosis: Principal Problem:   Acute congestive heart failure [facility]) Active Problems:   Alcohol  use disorder   Cocaine use   Cocaine abuse (HCC)   Acute systolic (congestive) heart failure (HCC)   Encephalopathy   Cardiogenic shock (HCC)   Palliative care patient   Discharge Medications: Allergies as of 12/09/2023   No Known Allergies      Medication List     STOP taking these medications    albuterol  108 (90 Base) MCG/ACT inhaler Commonly known as: VENTOLIN  HFA   aspirin  EC 81 MG tablet   atorvastatin  40 MG tablet Commonly known as: LIPITOR    losartan  25 MG tablet Commonly known as: COZAAR    metoprolol  succinate 25 MG 24 hr tablet Commonly known as: TOPROL -XL   nitroGLYCERIN  0.4 MG SL tablet Commonly known as: NITROSTAT    oxyCODONE 5 MG immediate release tablet Commonly known as: Oxy IR/ROXICODONE   predniSONE  20 MG tablet Commonly known as: DELTASONE    spironolactone  25 MG tablet Commonly known as: ALDACTONE        TAKE these medications    acetaminophen  325 MG tablet Commonly known as: TYLENOL  Take 2 tablets (650 mg total) by mouth every 6 (six) hours as needed for mild pain (pain score 1-3) or fever (or Fever >/= 101).   acetaminophen  650 MG suppository Commonly known as: TYLENOL  Place 1 suppository (650 mg total) rectally every 6 (six) hours as needed for mild pain (pain score 1-3) or fever (or Fever >/= 101).   antiseptic oral rinse Liqd Apply 15 mLs topically as needed for dry mouth.   mouth rinse Liqd solution 15 mLs by Mouth Rinse route 4 (four) times daily as needed.   mouth rinse Liqd solution 15 mLs by Mouth Rinse route as needed (oral care).   artificial tears ophthalmic solution Place 1 drop into both eyes 4  (four) times daily as needed for dry eyes.   furosemide  40 MG tablet Commonly known as: LASIX  Take 1 tablet (40 mg total) by mouth 2 (two) times daily.   glycopyrrolate  1 MG tablet Commonly known as: ROBINUL  Take 1 tablet (1 mg total) by mouth every 4 (four) hours as needed (excessive secretions).   glycopyrrolate  0.2 MG/ML injection Commonly known as: ROBINUL  Inject 1 mL (0.2 mg total) into the skin every 4 (four) hours as needed (excessive secretions).   glycopyrrolate  0.2 MG/ML injection Commonly known as: ROBINUL  Inject 1 mL (0.2 mg total) into the vein every 4 (four) hours as needed (excessive secretions).   haloperidol  0.5 MG tablet Commonly known as: HALDOL  Take 1 tablet (0.5 mg total) by mouth every 4 (four) hours as needed for agitation (or delirium).   haloperidol  2 MG/ML solution Commonly known as: HALDOL  Place 0.3 mLs (0.6 mg total) under the tongue every 4 (four) hours as needed for agitation (or delirium).   haloperidol  lactate 5 MG/ML injection Commonly known as: HALDOL  Inject 0.1 mLs (0.5 mg total) into the vein every 4 (four) hours as needed (or delirium).   LORazepam  1 MG tablet Commonly known as: ATIVAN  Take 1 tablet (1 mg total) by mouth every 4 (four) hours as needed for anxiety.   LORazepam  2 MG/ML concentrated solution Commonly known as: ATIVAN  Place 0.5 mLs (1 mg total) under the tongue every 4 (four) hours as needed  for anxiety.   LORazepam  2 MG/ML injection Commonly known as: ATIVAN  Inject 0.5 mLs (1 mg total) into the vein every 4 (four) hours as needed for anxiety.   morphine  (PF) 2 MG/ML injection Inject 0.5 mLs (1 mg total) into the vein every 4 (four) hours.   morphine  (PF) 2 MG/ML injection Inject 0.5-2 mLs (1-4 mg total) into the vein every 15 (fifteen) minutes as needed (To alleviate signs and symptoms of distress).   nicotine  7 mg/24hr patch Commonly known as: NICODERM CQ  - dosed in mg/24 hr Place 1 patch (7 mg total) onto the skin  daily.   ondansetron  4 MG disintegrating tablet Commonly known as: ZOFRAN -ODT Take 1 tablet (4 mg total) by mouth every 6 (six) hours as needed for nausea.   ondansetron  4 MG/2ML Soln injection Commonly known as: ZOFRAN  Inject 2 mLs (4 mg total) into the vein every 6 (six) hours as needed for nausea.   senna-docusate 8.6-50 MG tablet Commonly known as: Senokot-S Take 1 tablet by mouth at bedtime as needed for mild constipation.        Disposition and follow-up:   Ms.Emma Hahn was discharged from [facility] in Stable condition.   Hospital Course by problem list: Acute on chronic biventricular sytolic heart failure Nonischemic cardiomyopathy per LHC in 2021 Severe mitral regurgitation )02/2023 Lactic Acidosis  Recently discharged for history of same in [place]  10/2023. At that time TTE was done with LVEF estimated to be 20-25% and severe global hypokinesis. No valvular disease. This LVEF is decreased from the TTE done at [facility] during 02/2023 admission, where the LVED was estimated to be 30-35%. Patient had been nonadherent to medications and continued to use cocaine. Patient became encephalopathic with lactic acidosis. TTE LVEF 20-25%, severe MR and TR. After milrinone , lactic acid down to 1.5 with increase in SvO2 from 26 to 41% suggesting transition towards hemodynamic stability. Not a candidate for advanced therapies, palliative care consulted and family wished to transition to comfort care.     Polysubstance use disorder CIWA with ativan  The patient was counseled on cessation of substances.  She was given thiamine  and folate supplementation and was monitored for refeeding syndrome before transitioning to comfort care.  COPD Current smoker No exacerbations during hospitalization.   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 At her baseline Stable   Hx Hepatitis C, untreated LFTs on admission within normal limits. RUQ US  10/2023 at outside hospital with no focal  lesion or mass seen in the liver. Contracted gallbladder. AFP 5.74, normal limits. Patient does not wish for treatment.     Discharge Subjective: Ms.Emma Hahn is medically stable for discharge to hospice facility.  Discharge Exam:   BP 126/62   Pulse 70   Temp 98.5 F (36.9 C)   Resp 19   Ht 5' 4 (1.626 m)   Wt 61.2 kg   SpO2 100%   BMI 23.16 kg/m  Physical Exam: General: Ill-appearing woman in NAD lying in bed, opened eyes and turned head to verbal greeting but then did not respond further. Appears comfortable. Irregular breathing without apneic episodes.    Pertinent Labs, Studies, and Procedures:     Latest Ref Rng & Units 11/26/2023    4:25 AM 11/23/2023    6:57 AM 11/22/2023    2:42 AM  CBC  WBC 4.0 - 10.5 K/uL 8.7  9.8  7.6   Hemoglobin 12.0 - 15.0 g/dL 89.6  89.6  9.5   Hematocrit 36.0 - 46.0 %  31.7  32.4  30.4   Platelets 150 - 400 K/uL 190  196  195        Latest Ref Rng & Units 11/26/2023    4:25 AM 11/25/2023    3:36 AM 11/24/2023    7:50 AM  CMP  Glucose 70 - 99 mg/dL 886  832  863   BUN 8 - 23 mg/dL 18  24  19    Creatinine 0.44 - 1.00 mg/dL 8.74  8.65  8.74   Sodium 135 - 145 mmol/L 139  136  136   Potassium 3.5 - 5.1 mmol/L 3.2  3.8  4.4   Chloride 98 - 111 mmol/L 103  102  102   CO2 22 - 32 mmol/L 23  24  19    Calcium  8.9 - 10.3 mg/dL 8.4  8.8  9.0     CT Head Wo Contrast Result Date: 11/21/2023 EXAM: CT HEAD WITHOUT CONTRAST 11/21/2023 08:45:41 AM TECHNIQUE: CT of the head was performed without the administration of intravenous contrast. Automated exposure control, iterative reconstruction, and/or weight based adjustment of the mA/kV was utilized to reduce the radiation dose to as low as reasonably achievable. COMPARISON: None available. CLINICAL HISTORY: 74 year old female. Mental status change, unknown cause. FINDINGS: BRAIN AND VENTRICLES: Brain volume within normal limits for age. Mild for age patchy white matter hypodensity, mostly in  the deep white matter capsules. 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 No suspicious intracranial vascular hyperdensity. No acute hemorrhage. No evidence of acute infarct. No hydrocephalus. No extra-axial collection. No mass effect or midline shift. ORBITS: No acute abnormality. SINUSES: Paranasal sinuses, middle ears and mastoids well aerated. SOFT TISSUES AND SKULL: Calcified atherosclerosis at the skull base. No acute soft tissue abnormality. No skull fracture. IMPRESSION: 1. No acute intracranial abnormality. 2. Mild for age chronic small vessel disease. Electronically signed by: Helayne Hurst MD 11/21/2023 08:57 AM EST RP Workstation: [id number]   DG Chest 2 View Result Date: 11/21/2023 EXAM: 2 VIEW(S) XRAY OF THE CHEST 11/21/2023 08:29:00 AM COMPARISON: 03/04/2023 CLINICAL HISTORY: CHF FINDINGS: LUNGS AND PLEURA: Probable mild bilateral pulmonary edema is noted. Small pleural effusions are present. No focal pulmonary opacity. No pneumothorax. HEART AND MEDIASTINUM: Stable cardiomegaly. BONES AND SOFT TISSUES: No acute osseous abnormality. IMPRESSION: 1. Stable cardiomegaly. 2. Findings compatible with mild pulmonary edema. 3. Small pleural effusions. Electronically signed by: Lynwood Seip MD 11/21/2023 08:34 AM EST RP Workstation: [id number]     Discharge Instructions:   Discharge Instructions      To Kashlynn Kundert or their caretakers,  You were recently admitted to [facility] for biventricular heart failure. You were placed on comfort care and assisted by our palliative medicine team. We hope you are comfortable.   Sincerely,  [facility]      Signed:  Letha Cheadle, MD Internal Medicine Resident, PGY-1 12/09/2023, 12:02 PM Please contact the on call pager after 5 pm and on weekends at [phone].

## 2023-12-07 NOTE — Plan of Care (Signed)
  Problem: Clinical Measurements: Goal: Ability to maintain clinical measurements within normal limits will improve Outcome: Not Progressing   Problem: Activity: Goal: Risk for activity intolerance will decrease Outcome: Not Progressing   Problem: Nutrition: Goal: Adequate nutrition will be maintained Outcome: Not Progressing   

## 2023-12-07 NOTE — Discharge Instructions (Signed)
 To Emma Hahn or their caretakers,  You were recently admitted to [facility] for biventricular heart failure. You were placed on comfort care and assisted with our palliative medicine team.    Sincerely,  [facility]

## 2023-12-08 DIAGNOSIS — I5043 Acute on chronic combined systolic (congestive) and diastolic (congestive) heart failure: Secondary | ICD-10-CM | POA: Diagnosis not present

## 2023-12-08 DIAGNOSIS — Z66 Do not resuscitate: Secondary | ICD-10-CM | POA: Diagnosis not present

## 2023-12-08 DIAGNOSIS — I509 Heart failure, unspecified: Secondary | ICD-10-CM | POA: Diagnosis not present

## 2023-12-08 DIAGNOSIS — Z515 Encounter for palliative care: Secondary | ICD-10-CM | POA: Diagnosis not present

## 2023-12-08 DIAGNOSIS — I5082 Biventricular heart failure: Secondary | ICD-10-CM | POA: Diagnosis not present

## 2023-12-08 NOTE — Plan of Care (Signed)
  Problem: Pain Management: Goal: Satisfaction with pain management regimen will improve 12/08/2023 0155 by Georjean Delene SQUIBB, RN Outcome: Progressing 12/08/2023 0154 by Georjean Delene SQUIBB, RN Outcome: Not Progressing

## 2023-12-08 NOTE — Progress Notes (Signed)
 Subjective:Emma Hahn is a 73 y.o. female with PMH of polysubtance use disorder, chronic combined systolic and diastolic heart failure, COPD, chronic kidney disease stage 3b, substance use disorder, recurrent hospitalizations, who was recently discharged from [facility] for exacerbation of her chronic HF. Found in [place] confused, brought to ED and admitted for acute on chronic HFrEF. Hospitalization has been complicated by development of cardiogenic shock and encephalopathy and was then transitioned to comfort care.   12/02/23: Discussed patient's prognosis and condition with Emma Hahn (sister) who agrees that patient should remain comfort care. Does not want to transfer the patient from hospital.  12/03/23: Per Palliative note, patient's family reiterated their preference to not transfer the patient to hospice facility.  12/06/23: Discussed with daughter, Emma Hahn who states now she would like patient to be transferred to [facility]. No beds were available at that time.  Today, the patient remains minimally interactive in bed. She did not respond to greetings or questions.  Objective:  Vital signs in last 24 hours: Vitals:   12/05/23 0431 12/06/23 0605 12/07/23 0612 12/08/23 0425  BP: 123/77 131/89 (!) 140/84 (!) 137/94  Pulse: 81 (!) 107 94 89  Resp: 20 19    Temp: 98.1 F (36.7 C) 97.7 F (36.5 C) 97.8 F (36.6 C) 97.8 F (36.6 C)  TempSrc: Axillary Oral Oral Axillary  SpO2: 95% (!) 40% 94% 91%  Weight:      Height:       Physical Exam: General: Lying in bed, opened eyes to verbal greeting but then did not respond further. Appears comfortable.      Latest Ref Rng & Units 11/26/2023    4:25 AM 11/23/2023    6:57 AM 11/22/2023    2:42 AM  CBC  WBC 4.0 - 10.5 K/uL 8.7  9.8  7.6   Hemoglobin 12.0 - 15.0 g/dL 89.6  89.6  9.5   Hematocrit 36.0 - 46.0 % 31.7  32.4  30.4   Platelets 150 - 400 K/uL 190  196  195         Latest Ref Rng & Units  11/26/2023    4:25 AM 11/25/2023    3:36 AM 11/24/2023    7:50 AM  BMP  Glucose 70 - 99 mg/dL 886  832  863   BUN 8 - 23 mg/dL 18  24  19    Creatinine 0.44 - 1.00 mg/dL 8.74  8.65  8.74   Sodium 135 - 145 mmol/L 139  136  136   Potassium 3.5 - 5.1 mmol/L 3.2  3.8  4.4   Chloride 98 - 111 mmol/L 103  102  102   CO2 22 - 32 mmol/L 23  24  19    Calcium  8.9 - 10.3 mg/dL 8.4  8.8  9.0      Assessment/Plan:  Principal Problem:   Acute congestive heart failure (HCC) Active Problems:   Alcohol  use disorder   Cocaine use   Cocaine abuse (HCC)   Acute systolic (congestive) heart failure (HCC)   Encephalopathy   Cardiogenic shock (HCC)   Palliative care patient  Acute on chronic biventricular sytolic heart failure Nonischemic cardiomyopathy per LHC in 2021 Severe mitral regurgitation )02/2023 Lactic Acidosis Encephalopathy Goals of care conversation previously during this admission: family elected to transition to comfort care. On 12/06/23 patient's family changed their minds and would like transfer to hospice facility. Awaiting [facility] at [facility].  Palliative care is following, appreciate their assistance. - Comfort  care orders managed by Palliative   DM Polysubstance use disorder CIWA with ativan  Cocaine Use  COPD  HTN HLD CAD Hx Hepatitis C, untreated Patient transitioned to comfort care. See above   Code Status: DNR comfort  Dispo: Anticipate in hospital death   Waymond Cart, MD Internal Medicine ResidencyPGY-1 12/08/2023, 12:21 PM  Please contact the on call pager at: [phone]

## 2023-12-08 NOTE — Progress Notes (Signed)
 Emma Hahn [phone]  [facility] Liaison Note:   Referral received for [name].    At this time [name] does not have any beds to offer. We will follow up for evaluation of appropriateness once a bed becomes available.    Thank you for the opportunity to participate in this patient's plan of care.  [name], BSN, [employer] [phone]

## 2023-12-08 NOTE — Progress Notes (Signed)
 Daily Progress Note   Patient Name: Emma Hahn       Date: 12/08/2023 DOB: 06/27/1950  Age: 73 y.o. MRN#: [id number] Attending Physician: Shawn Sick, MD Primary Care Physician: Rosalea Rosina SAILOR, [place] Admit Date: 11/21/2023  Reason for Consultation/Follow-up: Establishing goals of care  Length of Stay: 16  Current Medications: Scheduled Meds:   Chlorhexidine  Gluconate Cloth  6 each Topical Daily    morphine  injection  1 mg Intravenous Q4H   nicotine   7 mg Transdermal Daily   mouth rinse  15 mL Mouth Rinse 4 times per day   sodium chloride  flush  10-40 mL Intracatheter Q12H   sodium chloride  flush  10-40 mL Intracatheter Q12H    Continuous Infusions:   PRN Meds: acetaminophen  **OR** acetaminophen , albuterol , antiseptic oral rinse, artificial tears, glycopyrrolate  **OR** glycopyrrolate  **OR** glycopyrrolate , haloperidol  **OR** haloperidol  **OR** haloperidol  lactate, LORazepam  **OR** LORazepam  **OR** LORazepam , morphine  injection, ondansetron  **OR** ondansetron  (ZOFRAN ) IV, mouth rinse, senna-docusate, sodium chloride  flush, sodium chloride  flush  Physical Exam Vitals reviewed.  Constitutional:      General: She is sleeping. She is not in acute distress.    Appearance: She is ill-appearing.  HENT:     Head: Normocephalic and atraumatic.     Mouth/Throat:     Mouth: Mucous membranes are dry.  Pulmonary:     Comments: Irregular  Skin:    General: Skin is dry.             Vital Signs: BP (!) 137/94 (BP Location: Left Arm)   Pulse 89   Temp 97.8 F (36.6 C) (Axillary)   Resp 19   Ht 5' 4 (1.626 m)   Wt 61.2 kg   SpO2 91%   BMI 23.16 kg/m  SpO2: SpO2: 91 % O2 Device: O2 Device: Room Air       Palliative Assessment/Data:10%      Patient Active Problem  List   Diagnosis Date Noted   Palliative care patient 12/02/2023   Cardiogenic shock (HCC) 11/25/2023   Encephalopathy 11/24/2023   Cocaine abuse (HCC) 11/22/2023   Acute systolic (congestive) heart failure (HCC) 11/22/2023   Cocaine use 11/21/2023   Hypomagnesemia 03/05/2023   HTN (hypertension) 03/05/2023   Hyperlipidemia 03/05/2023   Acute pulmonary edema (HCC) 03/04/2023   COPD with acute exacerbation (HCC) 03/04/2023   AKI (  acute kidney injury)    Acute congestive heart failure (HCC) 07/11/2021   MR (mitral regurgitation) 07/11/2021   Financial difficulties 12/10/2019   Alcohol  use disorder 12/09/2019   Acute respiratory failure with hypoxia (HCC) 12/08/2019   Acute on chronic systolic CHF (congestive heart failure) (HCC) 12/06/2019   Tobacco abuse 12/06/2019   Prolonged QT interval 12/06/2019   Chest pain 12/06/2019   Elevated troponin 12/06/2019   Prediabetes 12/06/2019   Do not resuscitate 12/06/2019    Palliative Care Assessment & Plan   Patient Profile: Emma Hahn is a 73 y.o. female with PMH of polysubtance use disorder, chronic combined systolic and diastolic heart failure, COPD, chronic kidney disease stage 3b, substance use disorder, recurrent hospitalizations. Palliative care has been requested to support goals of care conversations.   Today's Discussion: Reviewed chart. There were no beds available yesterday at [facility]. Patient on comfort measures since 11/26/23.  Patient continues with scheduled IV morphine  every four hours. Patient did not require prn medications in the last 24 hours. Her breathing is irregular. She appears comfortable. No family at bedside.    PMT will continue to support.   Recommendations/Plan: DNR- comfort PMT will continue to follow    Code Status:    Code Status Orders  (From admission, onward)           Start     Ordered   11/26/23 1435  Do not attempt resuscitation (DNR) - Comfort care  Continuous        Question Answer Comment  If patient has no pulse and is not breathing Do Not Attempt Resuscitation   In Pre-Arrest Conditions (Patient Is Breathing and Has a Pulse) Provide comfort measures. Relieve any mechanical airway obstruction. Avoid transfer unless required for comfort.   Consent: Discussion documented in EHR or advanced directives reviewed      11/26/23 1438         Extensive chart review has been completed prior to seeing the patient including vital signs, progress/consult notes, orders, medications, and available advance directive documents.  Time spent: 25 minutes  Thank you for allowing the Palliative Medicine Team to assist in the care of this patient.    Stephane CHRISTELLA Palin, NP  Please contact Palliative Medicine Team phone at [phone] for questions and concerns.

## 2023-12-09 ENCOUNTER — Other Ambulatory Visit (HOSPITAL_COMMUNITY): Payer: Self-pay

## 2023-12-09 DIAGNOSIS — I5082 Biventricular heart failure: Secondary | ICD-10-CM | POA: Diagnosis not present

## 2023-12-09 DIAGNOSIS — F1910 Other psychoactive substance abuse, uncomplicated: Secondary | ICD-10-CM

## 2023-12-09 DIAGNOSIS — J449 Chronic obstructive pulmonary disease, unspecified: Secondary | ICD-10-CM | POA: Diagnosis not present

## 2023-12-09 DIAGNOSIS — I509 Heart failure, unspecified: Secondary | ICD-10-CM | POA: Diagnosis not present

## 2023-12-09 DIAGNOSIS — Z515 Encounter for palliative care: Secondary | ICD-10-CM | POA: Diagnosis not present

## 2023-12-09 DIAGNOSIS — I5023 Acute on chronic systolic (congestive) heart failure: Secondary | ICD-10-CM | POA: Diagnosis not present

## 2023-12-09 MED ORDER — ACETAMINOPHEN 325 MG PO TABS: 650.0000 mg | ORAL_TABLET | Freq: Four times a day (QID) | ORAL | Status: DC | PRN

## 2023-12-09 MED ORDER — ONDANSETRON HCL 4 MG/2ML IJ SOLN
4.0000 mg | Freq: Four times a day (QID) | INTRAMUSCULAR | 0 refills | Status: DC | PRN
  Filled 2023-12-09: qty 2, 1d supply, fill #0

## 2023-12-09 MED ORDER — GLYCOPYRROLATE 1 MG PO TABS: 1.0000 mg | ORAL_TABLET | ORAL | Status: DC | PRN

## 2023-12-09 MED ORDER — MORPHINE SULFATE (PF) 2 MG/ML IV SOLN: 1.0000 mg | INTRAVENOUS | Status: DC | PRN

## 2023-12-09 MED ORDER — SENNOSIDES-DOCUSATE SODIUM 8.6-50 MG PO TABS: 1.0000 | ORAL_TABLET | Freq: Every evening | ORAL | Status: DC | PRN

## 2023-12-09 MED ORDER — ONDANSETRON 4 MG PO TBDP: 4.0000 mg | ORAL_TABLET | Freq: Four times a day (QID) | ORAL | Status: DC | PRN

## 2023-12-09 MED ORDER — ORAL CARE MOUTH RINSE: 15.0000 mL | Freq: Four times a day (QID) | OROMUCOSAL | Status: DC | PRN

## 2023-12-09 MED ORDER — GLYCOPYRROLATE 0.2 MG/ML IJ SOLN: 0.2000 mg | INTRAMUSCULAR | Status: DC | PRN

## 2023-12-09 MED ORDER — BIOTENE DRY MOUTH MT LIQD: 15.0000 mL | OROMUCOSAL | Status: DC | PRN

## 2023-12-09 MED ORDER — ORAL CARE MOUTH RINSE: 15.0000 mL | OROMUCOSAL | Status: DC | PRN

## 2023-12-09 MED ORDER — HALOPERIDOL 0.5 MG PO TABS: 0.5000 mg | ORAL_TABLET | ORAL | Status: DC | PRN

## 2023-12-09 MED ORDER — LORAZEPAM 1 MG PO TABS: 1.0000 mg | ORAL_TABLET | ORAL | Status: DC | PRN

## 2023-12-09 MED ORDER — POLYVINYL ALCOHOL 1.4 % OP SOLN: 1.0000 [drp] | Freq: Four times a day (QID) | OPHTHALMIC | Status: DC | PRN

## 2023-12-09 MED ORDER — LORAZEPAM 2 MG/ML PO CONC: 1.0000 mg | ORAL | Status: DC | PRN

## 2023-12-09 MED ORDER — ACETAMINOPHEN 650 MG RE SUPP: 650.0000 mg | Freq: Four times a day (QID) | RECTAL | Status: DC | PRN

## 2023-12-09 MED ORDER — HALOPERIDOL LACTATE 5 MG/ML IJ SOLN: 0.5000 mg | INTRAMUSCULAR | Status: DC | PRN

## 2023-12-09 MED ORDER — HALOPERIDOL LACTATE 2 MG/ML PO CONC: 0.5000 mg | ORAL | Status: DC | PRN

## 2023-12-09 MED ORDER — LORAZEPAM 2 MG/ML IJ SOLN
1.0000 mg | INTRAMUSCULAR | 0 refills | Status: DC | PRN
  Filled 2023-12-09: qty 1, 1d supply, fill #0

## 2023-12-09 MED ORDER — MORPHINE SULFATE (PF) 2 MG/ML IV SOLN: 1.0000 mg | INTRAVENOUS | Status: DC

## 2023-12-09 MED ORDER — NICOTINE 7 MG/24HR TD PT24: 7.0000 mg | MEDICATED_PATCH | Freq: Every day | TRANSDERMAL | Status: DC

## 2023-12-09 NOTE — TOC Transition Note (Signed)
 Transition of Care [facility]) - Discharge Note   Patient Details  Name: Emma Hahn MRN: [id number] Date of Birth: [birth year]  Transition of Care [facility]) CM/SW Contact:  Jeoffrey LITTIE [name] Phone Number: 12/09/2023, 12:54 PM   Clinical Narrative:    Patient will DC to: [facility] place Anticipated DC date: 12/09/23 Family notified: Yes Transport by: [facility]   Per MD patient ready for DC to [facility]. RN to call report prior to discharge [phone] . RN, patient, patient's family, and facility notified of DC. Discharge Summary and FL2 sent to facility. DC packet on chart. Ambulance transport requested for patient.   CSW will sign off for now as social work intervention is no longer needed. Please consult us  again if new needs arise.     Final next level of care: Hospice Medical Facility Barriers to Discharge: Barriers Resolved   Patient Goals and CMS Choice Patient states their goals for this hospitalization and ongoing recovery are:: Unclear CMS Medicare.gov Compare Post Acute Care list provided to:: Patient Choice offered to / list presented to : Patient [place] ownership interest in [facility].provided to:: Patient    Discharge Placement   Existing PASRR number confirmed : 12/09/23          Patient chooses bed at: Other - please specify in the comment section below: [facility]) Patient to be transferred to facility by: [facility] Name of family member notified: Shanda Patient and family notified of of transfer: 12/09/23  Discharge Plan and Services Additional resources added to the After Visit Summary for   In-house Referral: Clinical Social Work Discharge Planning Services: CM Consult                                 Social Drivers of Health (SDOH) Interventions SDOH Screenings   Food Insecurity: No Food Insecurity (11/21/2023)  Housing: Low Risk  (11/21/2023)  Transportation Needs: No Transportation Needs (11/21/2023)  Utilities: Not At  Risk (11/21/2023)  Financial Resource Strain: Medium Risk (07/11/2021)  Social Connections: Patient Unable To Answer (11/27/2023)  Tobacco Use: High Risk (11/27/2023)     Readmission Risk Interventions    07/14/2021    2:13 PM  Readmission Risk Prevention Plan  Post Dischage Appt Complete  Medication Screening Complete  Transportation Screening Complete

## 2023-12-09 NOTE — Progress Notes (Signed)
 Subjective:Emma Hahn is a 73 y.o. female with PMH of polysubtance use disorder, chronic combined systolic and diastolic heart failure, COPD, chronic kidney disease stage 3b, substance use disorder, recurrent hospitalizations, who was recently discharged from [facility] for exacerbation of her chronic HF. Found in [place] confused, brought to ED and admitted for acute on chronic HFrEF. Hospitalization has been complicated by development of cardiogenic shock and encephalopathy and was then transitioned to comfort care.   12/02/23: Discussed patient's prognosis and condition with [name] (sister) who agrees that patient should remain comfort care. Does not want to transfer the patient from hospital.  12/03/23: Per Palliative note, patient's family reiterated their preference to not transfer the patient to hospice facility.  12/06/23: Discussed with daughter, [name] who states now she would like patient to be transferred to [facility]. No beds were available at that time.  Today, the patient remains minimally interactive in bed. She did not respond to greetings or questions.  Objective:  Vital signs in last 24 hours: Vitals:   12/06/23 0605 12/07/23 0612 12/08/23 0425 12/09/23 0526  BP: 131/89 (!) 140/84 (!) 137/94 126/62  Pulse: (!) 107 94 89 70  Resp: 19     Temp: 97.7 F (36.5 C) 97.8 F (36.6 C) 97.8 F (36.6 C) 98.5 F (36.9 C)  TempSrc: Oral Oral Axillary   SpO2: (!) 40% 94% 91% 100%  Weight:      Height:       Physical Exam: General: Ill-appearing woman in NAD lying in bed, opened eyes and turned head to verbal greeting but then did not respond further. Appears comfortable. Irregular breathing without apneic episodes.      Latest Ref Rng & Units 11/26/2023    4:25 AM 11/23/2023    6:57 AM 11/22/2023    2:42 AM  CBC  WBC 4.0 - 10.5 K/uL 8.7  9.8  7.6   Hemoglobin 12.0 - 15.0 g/dL 89.6  89.6  9.5   Hematocrit 36.0 - 46.0 % 31.7  32.4  30.4    Platelets 150 - 400 K/uL 190  196  195         Latest Ref Rng & Units 11/26/2023    4:25 AM 11/25/2023    3:36 AM 11/24/2023    7:50 AM  BMP  Glucose 70 - 99 mg/dL 886  832  863   BUN 8 - 23 mg/dL 18  24  19    Creatinine 0.44 - 1.00 mg/dL 8.74  8.65  8.74   Sodium 135 - 145 mmol/L 139  136  136   Potassium 3.5 - 5.1 mmol/L 3.2  3.8  4.4   Chloride 98 - 111 mmol/L 103  102  102   CO2 22 - 32 mmol/L 23  24  19    Calcium  8.9 - 10.3 mg/dL 8.4  8.8  9.0      Assessment/Plan:  Principal Problem:   Acute congestive heart failure (HCC) Active Problems:   Alcohol  use disorder   Cocaine use   Cocaine abuse (HCC)   Acute systolic (congestive) heart failure (HCC)   Encephalopathy   Cardiogenic shock (HCC)   Palliative care patient  Acute on chronic biventricular sytolic heart failure Nonischemic cardiomyopathy per [facility] in 2021 Severe mitral regurgitation )02/2023 Lactic Acidosis Encephalopathy Goals of care conversation previously during this admission: family elected to transition to comfort care. On 12/06/23 patient's family changed their minds and would like transfer to hospice facility. Awaiting bed availability at  [facility].  Palliative care is following, appreciate their assistance. - Comfort care orders managed by Palliative   DM Polysubstance use disorder CIWA with ativan  Cocaine Use  COPD  HTN HLD CAD Hx Hepatitis C, untreated Patient transitioned to comfort care. See above   Code Status: DNR comfort  Dispo: Anticipate in hospital death   Waymond Cart, MD Internal Medicine ResidencyPGY-1 12/09/2023, 10:24 AM  Please contact the on call pager at: [phone]

## 2023-12-09 NOTE — Progress Notes (Signed)
 Jolynn Pack [phone] [facility] hospital liaison note   Referral received from [facility] for family interest in [facility].   Spoke with pts [name] to explain services and hospice philosophy and all questions answered.  [facility] is able to accept patient this afternoon. Consents are complete.    RN staff, you may call report at any time to [phone], room is assigned when report is called.  Please leave IV intact and send completed DNR with patient.   Updated attending and [facility] manager via [employer].  Thank you for the opportunity to participate in this patient's care  Amy Darien BSN, RN [facility] Liaison [phone]

## 2023-12-09 NOTE — Progress Notes (Addendum)
 Daily Progress Note   Patient Name: Emma Hahn       Date: 12/09/2023 DOB: [birth year]  Age: 73 y.o. MRN#: [id number] Attending Physician: Shawn Sick, MD Primary Care Physician: Rosalea Rosina SAILOR, [place] Admit Date: 11/21/2023  Reason for Consultation/Follow-up: Establishing goals of care  Length of Stay: 17  Current Medications: Scheduled Meds:   Chlorhexidine  Gluconate Cloth  6 each Topical Daily    morphine  injection  1 mg Intravenous Q4H   nicotine   7 mg Transdermal Daily   mouth rinse  15 mL Mouth Rinse 4 times per day   sodium chloride  flush  10-40 mL Intracatheter Q12H   sodium chloride  flush  10-40 mL Intracatheter Q12H    Continuous Infusions:   PRN Meds: acetaminophen  **OR** acetaminophen , albuterol , antiseptic oral rinse, artificial tears, glycopyrrolate  **OR** glycopyrrolate  **OR** glycopyrrolate , haloperidol  **OR** haloperidol  **OR** haloperidol  lactate, LORazepam  **OR** LORazepam  **OR** LORazepam , morphine  injection, ondansetron  **OR** ondansetron  (ZOFRAN ) IV, mouth rinse, senna-docusate, sodium chloride  flush, sodium chloride  flush  Physical Exam Vitals reviewed.  Constitutional:      General: She is sleeping. She is not in acute distress.    Appearance: She is ill-appearing.  HENT:     Head: Normocephalic and atraumatic.  Pulmonary:     Comments: Irregular  Skin:    General: Skin is dry.             Vital Signs: BP 126/62   Pulse 70   Temp 98.5 F (36.9 C)   Resp 19   Ht 5' 4 (1.626 m)   Wt 61.2 kg   SpO2 100%   BMI 23.16 kg/m  SpO2: SpO2: 100 % O2 Device: O2 Device: Room Air       Palliative Assessment/Data:10%      Patient Active Problem List   Diagnosis Date Noted   Palliative care patient 12/02/2023   Cardiogenic shock (HCC)  11/25/2023   Encephalopathy 11/24/2023   Cocaine abuse (HCC) 11/22/2023   Acute systolic (congestive) heart failure (HCC) 11/22/2023   Cocaine use 11/21/2023   Hypomagnesemia 03/05/2023   HTN (hypertension) 03/05/2023   Hyperlipidemia 03/05/2023   Acute pulmonary edema (HCC) 03/04/2023   COPD with acute exacerbation (HCC) 03/04/2023   AKI (acute kidney injury)    Acute congestive heart failure (HCC) 07/11/2021   MR (mitral regurgitation) 07/11/2021  Financial difficulties 12/10/2019   Alcohol  use disorder 12/09/2019   Acute respiratory failure with hypoxia (HCC) 12/08/2019   Acute on chronic systolic CHF (congestive heart failure) (HCC) 12/06/2019   Tobacco abuse 12/06/2019   Prolonged QT interval 12/06/2019   Chest pain 12/06/2019   Elevated troponin 12/06/2019   Prediabetes 12/06/2019   Do not resuscitate 12/06/2019    Palliative Care Assessment & Plan   Patient Profile: Quaneisha Hanisch is a 73 y.o. female with PMH of polysubtance use disorder, chronic combined systolic and diastolic heart failure, COPD, chronic kidney disease stage 3b, substance use disorder, recurrent hospitalizations. Palliative care has been requested to support goals of care conversations.   Today's Discussion: Reviewed chart. There were no beds available yesterday at [facility]. Patient on comfort measures since 11/26/23.  Patient continues with scheduled IV morphine  every four hours. Patient did not require prn medications in the last 24 hours. Her breathing is irregular. She appears comfortable. No family at bedside.   Spoke to patient's daughter Emma Hahn by phone. I shared the patient appears very comfortable and has not required additional prn comfort medications. We discussed the [facility] patient when they have bed availability. Emma Hahn is hopeful she will be able to visit Friday if her mother is still here.   Emotional support and therapeutic listening provided.  Encouraged family to call with questions or concerns. PMT will continue to support.  Addendum: Patient accepted to [facility]. Should dc today.  Recommendations/Plan: DNR- comfort Accepted to [facility] PMT will continue to follow    Code Status:    Code Status Orders  (From admission, onward)           Start     Ordered   11/26/23 1435  Do not attempt resuscitation (DNR) - Comfort care  Continuous       Question Answer Comment  If patient has no pulse and is not breathing Do Not Attempt Resuscitation   In Pre-Arrest Conditions (Patient Is Breathing and Has a Pulse) Provide comfort measures. Relieve any mechanical airway obstruction. Avoid transfer unless required for comfort.   Consent: Discussion documented in EHR or advanced directives reviewed      11/26/23 1438         Extensive chart review has been completed prior to seeing the patient including vital signs, progress/consult notes, orders, medications, and available advance directive documents.  Time spent: 25 minutes  Thank you for allowing the Palliative Medicine Team to assist in the care of this patient.    Stephane CHRISTELLA Palin, NP  Please contact Palliative Medicine Team phone at [phone] for questions and concerns.

## 2023-12-09 NOTE — Plan of Care (Signed)
  Problem: Fluid Volume: Goal: Ability to maintain a balanced intake and output will improve Outcome: Progressing   Problem: Health Behavior/Discharge Planning: Goal: Ability to identify and utilize available resources and services will improve Outcome: Progressing   Problem: Metabolic: Goal: Ability to maintain appropriate glucose levels will improve Outcome: Progressing   Problem: Nutritional: Goal: Maintenance of adequate nutrition will improve Outcome: Progressing   Problem: Skin Integrity: Goal: Risk for impaired skin integrity will decrease Outcome: Progressing   Problem: Tissue Perfusion: Goal: Adequacy of tissue perfusion will improve Outcome: Progressing

## 2023-12-16 DEATH — deceased
